# Patient Record
Sex: Female | Born: 1940
Health system: Southern US, Community
[De-identification: ages and names within clinical notes are randomized; demographics above are authoritative.]

## PROBLEM LIST (undated history)

## (undated) DIAGNOSIS — E785 Hyperlipidemia, unspecified: Secondary | ICD-10-CM

## (undated) DIAGNOSIS — G629 Polyneuropathy, unspecified: Secondary | ICD-10-CM

## (undated) DIAGNOSIS — M5430 Sciatica, unspecified side: Secondary | ICD-10-CM

## (undated) DIAGNOSIS — R1310 Dysphagia, unspecified: Secondary | ICD-10-CM

## (undated) DIAGNOSIS — F419 Anxiety disorder, unspecified: Secondary | ICD-10-CM

## (undated) DIAGNOSIS — I10 Essential (primary) hypertension: Secondary | ICD-10-CM

## (undated) DIAGNOSIS — K589 Irritable bowel syndrome without diarrhea: Secondary | ICD-10-CM

## (undated) DIAGNOSIS — T7840XA Allergy, unspecified, initial encounter: Secondary | ICD-10-CM

## (undated) DIAGNOSIS — M8580 Other specified disorders of bone density and structure, unspecified site: Secondary | ICD-10-CM

## (undated) DIAGNOSIS — M545 Low back pain, unspecified: Secondary | ICD-10-CM

## (undated) DIAGNOSIS — M5416 Radiculopathy, lumbar region: Secondary | ICD-10-CM

## (undated) DIAGNOSIS — K219 Gastro-esophageal reflux disease without esophagitis: Secondary | ICD-10-CM

## (undated) DIAGNOSIS — M791 Myalgia, unspecified site: Secondary | ICD-10-CM

## (undated) DIAGNOSIS — I6523 Occlusion and stenosis of bilateral carotid arteries: Secondary | ICD-10-CM

## (undated) DIAGNOSIS — G479 Sleep disorder, unspecified: Secondary | ICD-10-CM

## (undated) DIAGNOSIS — G8929 Other chronic pain: Secondary | ICD-10-CM

## (undated) DIAGNOSIS — N200 Calculus of kidney: Secondary | ICD-10-CM

## (undated) DIAGNOSIS — J40 Bronchitis, not specified as acute or chronic: Secondary | ICD-10-CM

## (undated) DIAGNOSIS — R2689 Other abnormalities of gait and mobility: Secondary | ICD-10-CM

## (undated) DIAGNOSIS — Z923 Personal history of irradiation: Secondary | ICD-10-CM

## (undated) DIAGNOSIS — K5792 Diverticulitis of intestine, part unspecified, without perforation or abscess without bleeding: Secondary | ICD-10-CM

## (undated) DIAGNOSIS — C801 Malignant (primary) neoplasm, unspecified: Secondary | ICD-10-CM

## (undated) DIAGNOSIS — C50919 Malignant neoplasm of unspecified site of unspecified female breast: Secondary | ICD-10-CM

## (undated) DIAGNOSIS — G4700 Insomnia, unspecified: Secondary | ICD-10-CM

## (undated) HISTORY — DX: Polyneuropathy, unspecified: G62.9

## (undated) HISTORY — PX: ABDOMINAL HYSTERECTOMY: SHX81

## (undated) HISTORY — DX: Low back pain, unspecified: M54.50

## (undated) HISTORY — DX: Other abnormalities of gait and mobility: R26.89

## (undated) HISTORY — DX: Malignant (primary) neoplasm, unspecified: C80.1

## (undated) HISTORY — DX: Irritable bowel syndrome, unspecified: K58.9

## (undated) HISTORY — DX: Diverticulitis of intestine, part unspecified, without perforation or abscess without bleeding: K57.92

## (undated) HISTORY — DX: Hyperlipidemia, unspecified: E78.5

## (undated) HISTORY — DX: Other specified disorders of bone density and structure, unspecified site: M85.80

## (undated) HISTORY — DX: Occlusion and stenosis of bilateral carotid arteries: I65.23

## (undated) HISTORY — DX: Bronchitis, not specified as acute or chronic: J40

## (undated) HISTORY — PX: CATARACT EXTRACTION, BILATERAL: SHX1313

## (undated) HISTORY — DX: Myalgia, unspecified site: M79.10

## (undated) HISTORY — DX: Calculus of kidney: N20.0

## (undated) HISTORY — DX: Sleep disorder, unspecified: G47.9

## (undated) HISTORY — PX: MOUTH SURGERY: SHX715

## (undated) HISTORY — PX: EXPLORATORY LAPAROTOMY: SUR591

## (undated) HISTORY — DX: Other chronic pain: G89.29

## (undated) HISTORY — DX: Radiculopathy, lumbar region: M54.16

## (undated) HISTORY — DX: Essential (primary) hypertension: I10

## (undated) HISTORY — DX: Insomnia, unspecified: G47.00

## (undated) HISTORY — DX: Sciatica, unspecified side: M54.30

## (undated) HISTORY — DX: Allergy, unspecified, initial encounter: T78.40XA

## (undated) HISTORY — DX: Dysphagia, unspecified: R13.10

## (undated) HISTORY — PX: BREAST LUMPECTOMY: SHX2

## (undated) HISTORY — PX: BREAST EXCISIONAL BIOPSY: SUR124

## (undated) HISTORY — PX: BREAST SURGERY: SHX581

## (undated) HISTORY — PX: EYE SURGERY: SHX253

## (undated) HISTORY — DX: Anxiety disorder, unspecified: F41.9

## (undated) SURGERY — Surgical Case
Anesthesia: *Unknown

---
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
## 1998-02-23 ENCOUNTER — Encounter: Payer: Self-pay | Admitting: Internal Medicine

## 1998-02-23 ENCOUNTER — Ambulatory Visit (HOSPITAL_COMMUNITY): Admission: RE | Admit: 1998-02-23 | Discharge: 1998-02-23 | Payer: Self-pay | Admitting: Internal Medicine

## 1998-03-12 ENCOUNTER — Ambulatory Visit (HOSPITAL_COMMUNITY): Admission: RE | Admit: 1998-03-12 | Discharge: 1998-03-12 | Payer: Self-pay | Admitting: *Deleted

## 1998-04-23 ENCOUNTER — Ambulatory Visit (HOSPITAL_COMMUNITY): Admission: RE | Admit: 1998-04-23 | Discharge: 1998-04-23 | Payer: Self-pay | Admitting: *Deleted

## 1998-05-05 ENCOUNTER — Encounter: Admission: RE | Admit: 1998-05-05 | Discharge: 1998-08-03 | Payer: Self-pay | Admitting: Radiation Oncology

## 1998-10-14 ENCOUNTER — Encounter: Payer: Self-pay | Admitting: Internal Medicine

## 1998-10-14 ENCOUNTER — Ambulatory Visit (HOSPITAL_COMMUNITY): Admission: RE | Admit: 1998-10-14 | Discharge: 1998-10-14 | Payer: Self-pay | Admitting: Internal Medicine

## 1998-10-15 ENCOUNTER — Ambulatory Visit (HOSPITAL_COMMUNITY): Admission: RE | Admit: 1998-10-15 | Discharge: 1998-10-15 | Payer: Self-pay | Admitting: Gastroenterology

## 1999-04-20 ENCOUNTER — Encounter: Admission: RE | Admit: 1999-04-20 | Discharge: 1999-04-20 | Payer: Self-pay | Admitting: Internal Medicine

## 1999-04-20 ENCOUNTER — Encounter: Payer: Self-pay | Admitting: Internal Medicine

## 1999-06-23 ENCOUNTER — Ambulatory Visit (HOSPITAL_COMMUNITY): Admission: RE | Admit: 1999-06-23 | Discharge: 1999-06-23 | Payer: Self-pay | Admitting: Internal Medicine

## 1999-06-23 ENCOUNTER — Encounter: Payer: Self-pay | Admitting: Internal Medicine

## 2000-04-20 ENCOUNTER — Encounter: Payer: Self-pay | Admitting: Internal Medicine

## 2000-04-20 ENCOUNTER — Encounter: Admission: RE | Admit: 2000-04-20 | Discharge: 2000-04-20 | Payer: Self-pay | Admitting: Internal Medicine

## 2000-05-05 ENCOUNTER — Emergency Department (HOSPITAL_COMMUNITY): Admission: EM | Admit: 2000-05-05 | Discharge: 2000-05-05 | Payer: Self-pay | Admitting: Internal Medicine

## 2000-05-05 ENCOUNTER — Encounter: Payer: Self-pay | Admitting: Emergency Medicine

## 2000-05-29 ENCOUNTER — Ambulatory Visit (HOSPITAL_COMMUNITY): Admission: RE | Admit: 2000-05-29 | Discharge: 2000-05-29 | Payer: Self-pay | Admitting: Internal Medicine

## 2000-05-29 ENCOUNTER — Encounter: Payer: Self-pay | Admitting: Internal Medicine

## 2000-11-22 ENCOUNTER — Emergency Department (HOSPITAL_COMMUNITY): Admission: EM | Admit: 2000-11-22 | Discharge: 2000-11-22 | Payer: Self-pay | Admitting: Emergency Medicine

## 2000-12-13 ENCOUNTER — Encounter: Admission: RE | Admit: 2000-12-13 | Discharge: 2000-12-13 | Payer: Self-pay | Admitting: Gastroenterology

## 2000-12-13 ENCOUNTER — Encounter: Payer: Self-pay | Admitting: Gastroenterology

## 2001-08-28 ENCOUNTER — Encounter: Admission: RE | Admit: 2001-08-28 | Discharge: 2001-08-28 | Payer: Self-pay | Admitting: Internal Medicine

## 2001-08-28 ENCOUNTER — Encounter: Payer: Self-pay | Admitting: Internal Medicine

## 2001-09-04 ENCOUNTER — Other Ambulatory Visit: Admission: RE | Admit: 2001-09-04 | Discharge: 2001-09-04 | Payer: Self-pay | Admitting: Gynecology

## 2002-08-09 ENCOUNTER — Encounter: Payer: Self-pay | Admitting: Gastroenterology

## 2002-08-09 ENCOUNTER — Encounter: Admission: RE | Admit: 2002-08-09 | Discharge: 2002-08-09 | Payer: Self-pay | Admitting: Gastroenterology

## 2002-09-24 ENCOUNTER — Ambulatory Visit (HOSPITAL_COMMUNITY): Admission: RE | Admit: 2002-09-24 | Discharge: 2002-09-24 | Payer: Self-pay | Admitting: Internal Medicine

## 2002-09-24 ENCOUNTER — Encounter: Payer: Self-pay | Admitting: Internal Medicine

## 2002-09-27 ENCOUNTER — Ambulatory Visit (HOSPITAL_COMMUNITY): Admission: RE | Admit: 2002-09-27 | Discharge: 2002-09-27 | Payer: Self-pay | Admitting: Gastroenterology

## 2002-10-01 ENCOUNTER — Ambulatory Visit (HOSPITAL_COMMUNITY): Admission: RE | Admit: 2002-10-01 | Discharge: 2002-10-01 | Payer: Self-pay | Admitting: Internal Medicine

## 2002-10-01 ENCOUNTER — Encounter: Payer: Self-pay | Admitting: Internal Medicine

## 2002-10-01 ENCOUNTER — Encounter: Admission: RE | Admit: 2002-10-01 | Discharge: 2002-10-01 | Payer: Self-pay | Admitting: Internal Medicine

## 2002-10-23 ENCOUNTER — Other Ambulatory Visit: Admission: RE | Admit: 2002-10-23 | Discharge: 2002-10-23 | Payer: Self-pay | Admitting: Gynecology

## 2003-10-02 ENCOUNTER — Encounter: Admission: RE | Admit: 2003-10-02 | Discharge: 2003-10-02 | Payer: Self-pay | Admitting: Oncology

## 2003-11-22 ENCOUNTER — Emergency Department (HOSPITAL_COMMUNITY): Admission: EM | Admit: 2003-11-22 | Discharge: 2003-11-22 | Payer: Self-pay

## 2005-05-19 ENCOUNTER — Encounter: Admission: RE | Admit: 2005-05-19 | Discharge: 2005-05-19 | Payer: Self-pay | Admitting: Internal Medicine

## 2005-10-28 ENCOUNTER — Encounter: Admission: RE | Admit: 2005-10-28 | Discharge: 2005-10-28 | Payer: Self-pay | Admitting: Internal Medicine

## 2006-05-24 ENCOUNTER — Encounter: Admission: RE | Admit: 2006-05-24 | Discharge: 2006-05-24 | Payer: Self-pay | Admitting: Internal Medicine

## 2006-05-25 ENCOUNTER — Ambulatory Visit: Payer: Self-pay | Admitting: Vascular Surgery

## 2006-05-25 ENCOUNTER — Ambulatory Visit (HOSPITAL_COMMUNITY): Admission: RE | Admit: 2006-05-25 | Discharge: 2006-05-25 | Payer: Self-pay | Admitting: Internal Medicine

## 2006-05-25 ENCOUNTER — Encounter: Payer: Self-pay | Admitting: Vascular Surgery

## 2006-11-09 ENCOUNTER — Ambulatory Visit (HOSPITAL_COMMUNITY): Admission: RE | Admit: 2006-11-09 | Discharge: 2006-11-09 | Payer: Self-pay | Admitting: Internal Medicine

## 2006-11-20 ENCOUNTER — Ambulatory Visit (HOSPITAL_COMMUNITY): Admission: RE | Admit: 2006-11-20 | Discharge: 2006-11-20 | Payer: Self-pay | Admitting: Internal Medicine

## 2007-05-15 ENCOUNTER — Other Ambulatory Visit: Admission: RE | Admit: 2007-05-15 | Discharge: 2007-05-15 | Payer: Self-pay | Admitting: Family Medicine

## 2007-06-06 ENCOUNTER — Encounter: Admission: RE | Admit: 2007-06-06 | Discharge: 2007-06-06 | Payer: Self-pay | Admitting: Family Medicine

## 2008-03-18 ENCOUNTER — Encounter: Admission: RE | Admit: 2008-03-18 | Discharge: 2008-03-18 | Payer: Self-pay | Admitting: *Deleted

## 2008-03-31 ENCOUNTER — Encounter: Admission: RE | Admit: 2008-03-31 | Discharge: 2008-03-31 | Payer: Self-pay | Admitting: *Deleted

## 2008-09-16 ENCOUNTER — Ambulatory Visit (HOSPITAL_COMMUNITY): Admission: RE | Admit: 2008-09-16 | Discharge: 2008-09-16 | Payer: Self-pay | Admitting: Internal Medicine

## 2008-10-30 ENCOUNTER — Encounter: Admission: RE | Admit: 2008-10-30 | Discharge: 2008-10-30 | Payer: Self-pay | Admitting: Family Medicine

## 2009-11-03 ENCOUNTER — Encounter: Admission: RE | Admit: 2009-11-03 | Discharge: 2009-11-03 | Payer: Self-pay | Admitting: Family Medicine

## 2010-03-16 ENCOUNTER — Other Ambulatory Visit: Payer: Self-pay | Admitting: Family Medicine

## 2010-03-22 ENCOUNTER — Ambulatory Visit
Admission: RE | Admit: 2010-03-22 | Discharge: 2010-03-22 | Disposition: A | Discharge status: Home / Self Care | Payer: Medicare Other | Source: Ambulatory Visit | Attending: Family Medicine | Admitting: Family Medicine

## 2010-05-28 NOTE — Op Note (Signed)
   NAME:  Alexandra Gonzales, Alexandra Gonzales                            ACCOUNT NO.:  [id number]   MEDICAL RECORD NO.:  [id number]                   PATIENT TYPE:  AMB   LOCATION:  ENDO                                 FACILITY:  [facility]   PHYSICIAN:  Anselmo Rod, M.D.               DATE OF BIRTH:  10/21/1940   DATE OF PROCEDURE:  09/27/2002  DATE OF DISCHARGE:  09/27/2002                                 OPERATIVE REPORT   PROCEDURE:  Screening colonoscopy.   ENDOSCOPIST:  Anselmo Rod, M.D.   INSTRUMENT USED:  Olympus video colonoscope.   INDICATIONS FOR PROCEDURE:  This 70 year old female with a personal history  of breast cancer, undergoing a screening colonoscopy to rule out colonic  polyps, masses, etc.   PRE-PROCEDURE PREPARATION:  An informed consent was procured from the  patient.  The patient was fasted for eight hours prior to the procedure, and  then prepped with a bottle of magnesium citrate and a gallon of GoLYTELY on  the night prior to the procedure.   PRE-PROCEDURE PHYSICAL EXAM:  VITAL SIGNS:  Stable.  NECK:  Supple.  CHEST:  Clear to auscultation.  HEART:  S1, S2 regular.  ABDOMEN:  Soft, with normal bowel sounds.   DESCRIPTION OF PROCEDURE:  The patient was placed in the left lateral  decubitus position and sedated with an additional 20 mg of Demerol and 2 mg  of Versed intravenously.  Once the patient was adequately sedated and  maintained on low-flow oxygen and continuous cardiac monitoring, the Olympus  video colonoscope was advanced from the rectum to the cecum without  difficulty.  Small external and internal hemorrhoids were seen.  No masses,  polyps, erosions, ulcerations, or diverticula were present.  The patient tolerated the procedure well, without complications.   IMPRESSION:  Small internal and external hemorrhoids, otherwise normal  colonoscopy to the cecum.   RECOMMENDATIONS:  1. Repeat colonic screening is recommended in the next five years,  considering the patient's personal history of     breast cancer.  2. Outpatient followup in the next two weeks.  3. Continue a high fiber diet with liberal fluid intake.                                               Anselmo Rod, M.D.    JNM/MEDQ  D:  09/30/2002  T:  09/30/2002  Job:  [id number]   cc:   Lucky Cowboy, M.D.  [address]  [place], [place] [address]  Fax: [phone]

## 2010-05-28 NOTE — Op Note (Signed)
   NAME:  Alexandra Gonzales, Alexandra Gonzales                            ACCOUNT NO.:  [id number]   MEDICAL RECORD NO.:  [id number]                   PATIENT TYPE:  AMB   LOCATION:  ENDO                                 FACILITY:  [facility]   PHYSICIAN:  Anselmo Rod, M.D.               DATE OF BIRTH:  27-Sep-1940   DATE OF PROCEDURE:  09/30/2002  DATE OF DISCHARGE:  09/27/2002                                 OPERATIVE REPORT   PROCEDURE PERFORMED:  Esophagogastroduodenoscopy.   ENDOSCOPIST:  Charna Elizabeth, M.D.   INSTRUMENT USED:  Olympus video panendoscope.   INDICATIONS FOR PROCEDURE:  Epigastric pain in a 70 year old female with a  previous history of breast cancer.  Rule out peptic ulcer disease,  esophagitis, gastritis, etc.   PREPROCEDURE PREPARATION:  Informed consent was procured from the patient.  The patient was fasted for eight hours prior to the procedure.   PREPROCEDURE PHYSICAL:  The patient had stable vital signs.  Neck supple,  chest clear to auscultation.  S1, S2 regular.  Abdomen soft with normal  bowel sounds.   DESCRIPTION OF PROCEDURE:  The patient was placed in the left lateral  decubitus position and sedated with 50 mg of Demerol and 5 mg of Versed  intravenously.  Once the patient was adequately sedated and maintained on  low-flow oxygen and continuous cardiac monitoring, the Olympus video  panendoscope was advanced through the mouth piece over the tongue into the  esophagus under direct vision.  The entire esophagus appeared normal with no  evidence of ring, stricture, masses, esophagitis or Barrett's mucosa.  The  scope was then advanced to the stomach.  The entire gastric mucosa of the  proximal small bowel appeared normal.   IMPRESSION:  Normal esophagogastroduodenoscopy.   RECOMMENDATIONS:  Proceed with colonoscopy at this time.  Further  recommendations made thereafter.                                                Anselmo Rod, M.D.    JNM/MEDQ  D:  09/30/2002   T:  09/30/2002  Job:  [id number]   cc:   Lucky Cowboy, M.D.  [address]  [place], [place] [address]  Fax: [phone]

## 2010-08-04 ENCOUNTER — Other Ambulatory Visit: Payer: Self-pay | Admitting: Neurology

## 2010-08-04 DIAGNOSIS — A881 Epidemic vertigo: Secondary | ICD-10-CM

## 2010-08-04 DIAGNOSIS — R413 Other amnesia: Secondary | ICD-10-CM

## 2010-08-12 ENCOUNTER — Ambulatory Visit
Admission: RE | Admit: 2010-08-12 | Discharge: 2010-08-12 | Disposition: A | Discharge status: Home / Self Care | Payer: Medicare Other | Source: Ambulatory Visit | Attending: Neurology | Admitting: Neurology

## 2010-08-12 DIAGNOSIS — R413 Other amnesia: Secondary | ICD-10-CM

## 2010-08-12 DIAGNOSIS — A881 Epidemic vertigo: Secondary | ICD-10-CM

## 2010-09-24 ENCOUNTER — Other Ambulatory Visit: Payer: Self-pay | Admitting: Family Medicine

## 2010-09-24 ENCOUNTER — Ambulatory Visit
Admission: RE | Admit: 2010-09-24 | Discharge: 2010-09-24 | Disposition: A | Discharge status: Home / Self Care | Payer: Medicare Other | Source: Ambulatory Visit | Attending: Family Medicine | Admitting: Family Medicine

## 2010-09-24 DIAGNOSIS — M5412 Radiculopathy, cervical region: Secondary | ICD-10-CM

## 2010-10-12 ENCOUNTER — Ambulatory Visit
Admission: RE | Admit: 2010-10-12 | Discharge: 2010-10-12 | Disposition: A | Discharge status: Home / Self Care | Payer: Medicare Other | Source: Ambulatory Visit | Attending: Family Medicine | Admitting: Family Medicine

## 2010-10-12 ENCOUNTER — Other Ambulatory Visit: Payer: Self-pay | Admitting: Family Medicine

## 2010-10-12 DIAGNOSIS — M545 Low back pain: Secondary | ICD-10-CM

## 2010-10-18 ENCOUNTER — Ambulatory Visit (HOSPITAL_COMMUNITY)
Admission: RE | Admit: 2010-10-18 | Discharge: 2010-10-18 | Disposition: A | Discharge status: Home / Self Care | Payer: Medicare Other | Source: Ambulatory Visit | Attending: Urology | Admitting: Urology

## 2010-10-18 DIAGNOSIS — R112 Nausea with vomiting, unspecified: Secondary | ICD-10-CM | POA: Insufficient documentation

## 2010-10-18 DIAGNOSIS — M51379 Other intervertebral disc degeneration, lumbosacral region without mention of lumbar back pain or lower extremity pain: Secondary | ICD-10-CM | POA: Insufficient documentation

## 2010-10-18 DIAGNOSIS — M545 Low back pain, unspecified: Secondary | ICD-10-CM | POA: Insufficient documentation

## 2010-10-18 DIAGNOSIS — N1330 Unspecified hydronephrosis: Secondary | ICD-10-CM | POA: Insufficient documentation

## 2010-10-18 DIAGNOSIS — R109 Unspecified abdominal pain: Secondary | ICD-10-CM | POA: Insufficient documentation

## 2010-10-18 DIAGNOSIS — N201 Calculus of ureter: Secondary | ICD-10-CM | POA: Insufficient documentation

## 2010-10-18 DIAGNOSIS — Z853 Personal history of malignant neoplasm of breast: Secondary | ICD-10-CM | POA: Insufficient documentation

## 2010-10-18 DIAGNOSIS — R3129 Other microscopic hematuria: Secondary | ICD-10-CM | POA: Insufficient documentation

## 2010-10-18 DIAGNOSIS — M5137 Other intervertebral disc degeneration, lumbosacral region: Secondary | ICD-10-CM | POA: Insufficient documentation

## 2010-10-18 DIAGNOSIS — I10 Essential (primary) hypertension: Secondary | ICD-10-CM | POA: Insufficient documentation

## 2010-11-12 ENCOUNTER — Other Ambulatory Visit: Payer: Self-pay | Admitting: Family Medicine

## 2010-11-12 DIAGNOSIS — Z1231 Encounter for screening mammogram for malignant neoplasm of breast: Secondary | ICD-10-CM

## 2010-11-23 ENCOUNTER — Other Ambulatory Visit: Payer: Self-pay | Admitting: Neurological Surgery

## 2010-11-23 ENCOUNTER — Ambulatory Visit
Admission: RE | Admit: 2010-11-23 | Discharge: 2010-11-23 | Disposition: A | Discharge status: Home / Self Care | Payer: Medicare Other | Source: Ambulatory Visit | Attending: Neurological Surgery | Admitting: Neurological Surgery

## 2010-11-23 DIAGNOSIS — M47812 Spondylosis without myelopathy or radiculopathy, cervical region: Secondary | ICD-10-CM

## 2010-11-23 DIAGNOSIS — M546 Pain in thoracic spine: Secondary | ICD-10-CM

## 2010-12-09 ENCOUNTER — Ambulatory Visit
Admission: RE | Admit: 2010-12-09 | Discharge: 2010-12-09 | Disposition: A | Discharge status: Home / Self Care | Payer: Medicare Other | Source: Ambulatory Visit | Attending: Family Medicine | Admitting: Family Medicine

## 2010-12-09 DIAGNOSIS — Z1231 Encounter for screening mammogram for malignant neoplasm of breast: Secondary | ICD-10-CM

## 2011-12-21 ENCOUNTER — Other Ambulatory Visit: Payer: Self-pay | Admitting: Family Medicine

## 2012-01-02 ENCOUNTER — Other Ambulatory Visit: Payer: Self-pay | Admitting: Family Medicine

## 2012-01-02 DIAGNOSIS — Z853 Personal history of malignant neoplasm of breast: Secondary | ICD-10-CM

## 2012-01-12 ENCOUNTER — Other Ambulatory Visit: Payer: Self-pay | Admitting: Family Medicine

## 2012-01-12 ENCOUNTER — Ambulatory Visit
Admission: RE | Admit: 2012-01-12 | Discharge: 2012-01-12 | Disposition: A | Discharge status: Home / Self Care | Payer: Medicare Other | Source: Ambulatory Visit | Attending: Family Medicine | Admitting: Family Medicine

## 2012-01-12 DIAGNOSIS — Z853 Personal history of malignant neoplasm of breast: Secondary | ICD-10-CM

## 2012-05-23 ENCOUNTER — Other Ambulatory Visit: Payer: Self-pay | Admitting: Family Medicine

## 2012-05-23 DIAGNOSIS — R109 Unspecified abdominal pain: Secondary | ICD-10-CM

## 2012-05-28 ENCOUNTER — Other Ambulatory Visit: Payer: Self-pay | Admitting: Family Medicine

## 2012-05-28 ENCOUNTER — Ambulatory Visit
Admission: RE | Admit: 2012-05-28 | Discharge: 2012-05-28 | Disposition: A | Discharge status: Home / Self Care | Payer: Medicare Other | Source: Ambulatory Visit | Attending: Family Medicine | Admitting: Family Medicine

## 2012-05-28 DIAGNOSIS — R109 Unspecified abdominal pain: Secondary | ICD-10-CM

## 2012-11-20 ENCOUNTER — Other Ambulatory Visit: Payer: Self-pay | Admitting: Gastroenterology

## 2012-11-20 DIAGNOSIS — R1033 Periumbilical pain: Secondary | ICD-10-CM

## 2012-12-18 ENCOUNTER — Encounter (HOSPITAL_COMMUNITY)
Admission: RE | Admit: 2012-12-18 | Discharge: 2012-12-18 | Disposition: A | Discharge status: Home / Self Care | Payer: Medicare Other | Source: Ambulatory Visit | Attending: Gastroenterology | Admitting: Gastroenterology

## 2012-12-18 ENCOUNTER — Ambulatory Visit (HOSPITAL_COMMUNITY)
Admission: RE | Admit: 2012-12-18 | Discharge: 2012-12-18 | Disposition: A | Discharge status: Home / Self Care | Payer: Medicare Other | Source: Ambulatory Visit | Attending: Gastroenterology | Admitting: Gastroenterology

## 2012-12-18 DIAGNOSIS — R1033 Periumbilical pain: Secondary | ICD-10-CM | POA: Insufficient documentation

## 2012-12-18 DIAGNOSIS — N281 Cyst of kidney, acquired: Secondary | ICD-10-CM | POA: Insufficient documentation

## 2012-12-18 DIAGNOSIS — R109 Unspecified abdominal pain: Secondary | ICD-10-CM | POA: Insufficient documentation

## 2012-12-18 MED ORDER — TECHNETIUM TC 99M MEBROFENIN IV KIT
5.0000 | PACK | Freq: Once | INTRAVENOUS | Status: AC | PRN
  Administered 2012-12-18: 5 via INTRAVENOUS

## 2013-03-20 ENCOUNTER — Other Ambulatory Visit: Payer: Self-pay

## 2013-03-20 DIAGNOSIS — Z1231 Encounter for screening mammogram for malignant neoplasm of breast: Secondary | ICD-10-CM

## 2013-03-20 DIAGNOSIS — Z9889 Other specified postprocedural states: Secondary | ICD-10-CM

## 2013-04-09 ENCOUNTER — Ambulatory Visit: Payer: Medicare Other

## 2013-04-16 ENCOUNTER — Ambulatory Visit
Admission: RE | Admit: 2013-04-16 | Discharge: 2013-04-16 | Disposition: A | Discharge status: Home / Self Care | Payer: Commercial Managed Care - HMO | Source: Ambulatory Visit

## 2013-04-16 DIAGNOSIS — Z9889 Other specified postprocedural states: Secondary | ICD-10-CM

## 2013-04-16 DIAGNOSIS — Z1231 Encounter for screening mammogram for malignant neoplasm of breast: Secondary | ICD-10-CM

## 2013-04-25 ENCOUNTER — Ambulatory Visit
Admission: RE | Admit: 2013-04-25 | Discharge: 2013-04-25 | Disposition: A | Discharge status: Home / Self Care | Payer: Commercial Managed Care - HMO | Source: Ambulatory Visit | Attending: Family Medicine | Admitting: Family Medicine

## 2013-04-25 ENCOUNTER — Other Ambulatory Visit: Payer: Self-pay | Admitting: Family Medicine

## 2013-04-25 DIAGNOSIS — J329 Chronic sinusitis, unspecified: Secondary | ICD-10-CM

## 2013-10-22 ENCOUNTER — Encounter (INDEPENDENT_AMBULATORY_CARE_PROVIDER_SITE_OTHER): Payer: Self-pay

## 2013-10-22 ENCOUNTER — Other Ambulatory Visit: Payer: Self-pay | Admitting: Family Medicine

## 2013-10-22 ENCOUNTER — Ambulatory Visit
Admission: RE | Admit: 2013-10-22 | Discharge: 2013-10-22 | Disposition: A | Discharge status: Home / Self Care | Payer: Commercial Managed Care - HMO | Source: Ambulatory Visit | Attending: Family Medicine | Admitting: Family Medicine

## 2013-10-22 DIAGNOSIS — R05 Cough: Secondary | ICD-10-CM

## 2013-10-22 DIAGNOSIS — R059 Cough, unspecified: Secondary | ICD-10-CM

## 2014-05-02 ENCOUNTER — Other Ambulatory Visit: Payer: Self-pay

## 2014-05-02 DIAGNOSIS — Z1231 Encounter for screening mammogram for malignant neoplasm of breast: Secondary | ICD-10-CM

## 2014-05-20 ENCOUNTER — Other Ambulatory Visit: Payer: Self-pay | Admitting: Family Medicine

## 2014-05-20 ENCOUNTER — Ambulatory Visit
Admission: RE | Admit: 2014-05-20 | Discharge: 2014-05-20 | Disposition: A | Discharge status: Home / Self Care | Payer: PPO | Source: Ambulatory Visit | Attending: Family Medicine | Admitting: Family Medicine

## 2014-05-20 DIAGNOSIS — M79671 Pain in right foot: Secondary | ICD-10-CM

## 2014-05-21 ENCOUNTER — Other Ambulatory Visit: Payer: Self-pay | Admitting: Family Medicine

## 2014-05-21 DIAGNOSIS — R1013 Epigastric pain: Secondary | ICD-10-CM

## 2014-05-22 ENCOUNTER — Ambulatory Visit: Payer: Commercial Managed Care - HMO

## 2014-05-27 ENCOUNTER — Ambulatory Visit
Admission: RE | Admit: 2014-05-27 | Discharge: 2014-05-27 | Disposition: A | Discharge status: Home / Self Care | Payer: PPO | Source: Ambulatory Visit | Attending: Family Medicine | Admitting: Family Medicine

## 2014-05-27 ENCOUNTER — Ambulatory Visit: Payer: PPO

## 2014-05-27 ENCOUNTER — Other Ambulatory Visit: Payer: PPO

## 2014-05-27 DIAGNOSIS — R1013 Epigastric pain: Secondary | ICD-10-CM

## 2014-05-27 MED ORDER — IOHEXOL 300 MG/ML  SOLN
100.0000 mL | Freq: Once | INTRAMUSCULAR | Status: AC | PRN
  Administered 2014-05-27: 100 mL via INTRAVENOUS

## 2014-05-29 ENCOUNTER — Other Ambulatory Visit: Payer: PPO

## 2014-06-19 ENCOUNTER — Ambulatory Visit
Admission: RE | Admit: 2014-06-19 | Discharge: 2014-06-19 | Disposition: A | Discharge status: Home / Self Care | Payer: PPO | Source: Ambulatory Visit

## 2014-06-19 DIAGNOSIS — Z1231 Encounter for screening mammogram for malignant neoplasm of breast: Secondary | ICD-10-CM

## 2014-09-11 ENCOUNTER — Ambulatory Visit
Admission: RE | Admit: 2014-09-11 | Discharge: 2014-09-11 | Disposition: A | Discharge status: Home / Self Care | Payer: PPO | Source: Ambulatory Visit | Attending: Cardiology | Admitting: Cardiology

## 2014-09-11 ENCOUNTER — Other Ambulatory Visit: Payer: Self-pay | Admitting: Cardiology

## 2014-09-11 DIAGNOSIS — R0602 Shortness of breath: Secondary | ICD-10-CM

## 2015-01-15 DIAGNOSIS — Z9011 Acquired absence of right breast and nipple: Secondary | ICD-10-CM | POA: Diagnosis not present

## 2015-02-02 DIAGNOSIS — M79644 Pain in right finger(s): Secondary | ICD-10-CM | POA: Diagnosis not present

## 2015-02-02 DIAGNOSIS — M1990 Unspecified osteoarthritis, unspecified site: Secondary | ICD-10-CM | POA: Diagnosis not present

## 2015-02-09 DIAGNOSIS — D1030 Benign neoplasm of unspecified part of mouth: Secondary | ICD-10-CM | POA: Diagnosis not present

## 2015-03-02 DIAGNOSIS — H00022 Hordeolum internum right lower eyelid: Secondary | ICD-10-CM | POA: Diagnosis not present

## 2015-03-02 DIAGNOSIS — Z961 Presence of intraocular lens: Secondary | ICD-10-CM | POA: Diagnosis not present

## 2015-03-09 DIAGNOSIS — H00022 Hordeolum internum right lower eyelid: Secondary | ICD-10-CM | POA: Diagnosis not present

## 2015-04-10 DIAGNOSIS — M542 Cervicalgia: Secondary | ICD-10-CM | POA: Diagnosis not present

## 2015-05-22 ENCOUNTER — Other Ambulatory Visit: Payer: Self-pay

## 2015-05-22 DIAGNOSIS — Z1231 Encounter for screening mammogram for malignant neoplasm of breast: Secondary | ICD-10-CM

## 2015-06-24 ENCOUNTER — Ambulatory Visit
Admission: RE | Admit: 2015-06-24 | Discharge: 2015-06-24 | Disposition: A | Discharge status: Home / Self Care | Payer: PPO | Source: Ambulatory Visit

## 2015-06-24 DIAGNOSIS — Z1231 Encounter for screening mammogram for malignant neoplasm of breast: Secondary | ICD-10-CM | POA: Diagnosis not present

## 2015-06-25 ENCOUNTER — Ambulatory Visit: Payer: PPO

## 2015-07-16 DIAGNOSIS — R350 Frequency of micturition: Secondary | ICD-10-CM | POA: Diagnosis not present

## 2015-07-16 DIAGNOSIS — G479 Sleep disorder, unspecified: Secondary | ICD-10-CM | POA: Diagnosis not present

## 2015-07-16 DIAGNOSIS — E7800 Pure hypercholesterolemia, unspecified: Secondary | ICD-10-CM | POA: Diagnosis not present

## 2015-07-16 DIAGNOSIS — M791 Myalgia: Secondary | ICD-10-CM | POA: Diagnosis not present

## 2015-07-16 DIAGNOSIS — K219 Gastro-esophageal reflux disease without esophagitis: Secondary | ICD-10-CM | POA: Diagnosis not present

## 2015-07-16 DIAGNOSIS — T148 Other injury of unspecified body region: Secondary | ICD-10-CM | POA: Diagnosis not present

## 2015-07-16 DIAGNOSIS — I10 Essential (primary) hypertension: Secondary | ICD-10-CM | POA: Diagnosis not present

## 2015-07-16 DIAGNOSIS — Z23 Encounter for immunization: Secondary | ICD-10-CM | POA: Diagnosis not present

## 2015-07-30 DIAGNOSIS — R1310 Dysphagia, unspecified: Secondary | ICD-10-CM | POA: Diagnosis not present

## 2015-07-30 DIAGNOSIS — M542 Cervicalgia: Secondary | ICD-10-CM | POA: Diagnosis not present

## 2015-07-30 DIAGNOSIS — R0689 Other abnormalities of breathing: Secondary | ICD-10-CM | POA: Diagnosis not present

## 2015-09-01 ENCOUNTER — Other Ambulatory Visit: Payer: Self-pay | Admitting: Gastroenterology

## 2015-09-01 DIAGNOSIS — K580 Irritable bowel syndrome with diarrhea: Secondary | ICD-10-CM | POA: Diagnosis not present

## 2015-09-01 DIAGNOSIS — R1310 Dysphagia, unspecified: Secondary | ICD-10-CM

## 2015-09-01 DIAGNOSIS — K219 Gastro-esophageal reflux disease without esophagitis: Secondary | ICD-10-CM | POA: Diagnosis not present

## 2015-09-01 DIAGNOSIS — Z86010 Personal history of colonic polyps: Secondary | ICD-10-CM | POA: Diagnosis not present

## 2015-09-04 ENCOUNTER — Ambulatory Visit
Admission: RE | Admit: 2015-09-04 | Discharge: 2015-09-04 | Disposition: A | Discharge status: Home / Self Care | Payer: PPO | Source: Ambulatory Visit | Attending: Gastroenterology | Admitting: Gastroenterology

## 2015-09-04 DIAGNOSIS — R1310 Dysphagia, unspecified: Secondary | ICD-10-CM | POA: Diagnosis not present

## 2015-09-10 DIAGNOSIS — C50911 Malignant neoplasm of unspecified site of right female breast: Secondary | ICD-10-CM | POA: Diagnosis not present

## 2015-09-22 DIAGNOSIS — K644 Residual hemorrhoidal skin tags: Secondary | ICD-10-CM | POA: Diagnosis not present

## 2015-09-30 DIAGNOSIS — M722 Plantar fascial fibromatosis: Secondary | ICD-10-CM | POA: Diagnosis not present

## 2015-10-05 DIAGNOSIS — K6289 Other specified diseases of anus and rectum: Secondary | ICD-10-CM | POA: Diagnosis not present

## 2015-11-06 DIAGNOSIS — I10 Essential (primary) hypertension: Secondary | ICD-10-CM | POA: Diagnosis not present

## 2015-11-06 DIAGNOSIS — E7800 Pure hypercholesterolemia, unspecified: Secondary | ICD-10-CM | POA: Diagnosis not present

## 2015-11-06 DIAGNOSIS — M791 Myalgia: Secondary | ICD-10-CM | POA: Diagnosis not present

## 2015-12-31 DIAGNOSIS — Z01419 Encounter for gynecological examination (general) (routine) without abnormal findings: Secondary | ICD-10-CM | POA: Diagnosis not present

## 2015-12-31 DIAGNOSIS — Z6827 Body mass index (BMI) 27.0-27.9, adult: Secondary | ICD-10-CM | POA: Diagnosis not present

## 2016-01-19 DIAGNOSIS — I10 Essential (primary) hypertension: Secondary | ICD-10-CM | POA: Diagnosis not present

## 2016-01-19 DIAGNOSIS — K5792 Diverticulitis of intestine, part unspecified, without perforation or abscess without bleeding: Secondary | ICD-10-CM | POA: Diagnosis not present

## 2016-01-19 DIAGNOSIS — R1030 Lower abdominal pain, unspecified: Secondary | ICD-10-CM | POA: Diagnosis not present

## 2016-01-19 DIAGNOSIS — E7800 Pure hypercholesterolemia, unspecified: Secondary | ICD-10-CM | POA: Diagnosis not present

## 2016-01-19 DIAGNOSIS — G479 Sleep disorder, unspecified: Secondary | ICD-10-CM | POA: Diagnosis not present

## 2016-01-19 DIAGNOSIS — K219 Gastro-esophageal reflux disease without esophagitis: Secondary | ICD-10-CM | POA: Diagnosis not present

## 2016-02-01 DIAGNOSIS — M542 Cervicalgia: Secondary | ICD-10-CM | POA: Diagnosis not present

## 2016-02-01 DIAGNOSIS — M545 Low back pain: Secondary | ICD-10-CM | POA: Diagnosis not present

## 2016-02-09 DIAGNOSIS — G518 Other disorders of facial nerve: Secondary | ICD-10-CM | POA: Diagnosis not present

## 2016-02-09 DIAGNOSIS — D3132 Benign neoplasm of left choroid: Secondary | ICD-10-CM | POA: Diagnosis not present

## 2016-02-09 DIAGNOSIS — Z961 Presence of intraocular lens: Secondary | ICD-10-CM | POA: Diagnosis not present

## 2016-02-09 DIAGNOSIS — H40013 Open angle with borderline findings, low risk, bilateral: Secondary | ICD-10-CM | POA: Diagnosis not present

## 2016-02-16 ENCOUNTER — Ambulatory Visit: Payer: PPO | Admitting: Physical Therapy

## 2016-02-18 ENCOUNTER — Ambulatory Visit: Payer: PPO | Attending: Family Medicine | Admitting: Physical Therapy

## 2016-02-18 ENCOUNTER — Encounter: Payer: Self-pay | Admitting: Physical Therapy

## 2016-02-18 DIAGNOSIS — R262 Difficulty in walking, not elsewhere classified: Secondary | ICD-10-CM | POA: Insufficient documentation

## 2016-02-18 DIAGNOSIS — M6281 Muscle weakness (generalized): Secondary | ICD-10-CM | POA: Insufficient documentation

## 2016-02-18 DIAGNOSIS — M545 Low back pain: Secondary | ICD-10-CM | POA: Insufficient documentation

## 2016-02-18 DIAGNOSIS — G629 Polyneuropathy, unspecified: Secondary | ICD-10-CM | POA: Diagnosis not present

## 2016-02-18 DIAGNOSIS — G8929 Other chronic pain: Secondary | ICD-10-CM | POA: Insufficient documentation

## 2016-02-18 DIAGNOSIS — K219 Gastro-esophageal reflux disease without esophagitis: Secondary | ICD-10-CM | POA: Insufficient documentation

## 2016-02-18 DIAGNOSIS — M542 Cervicalgia: Secondary | ICD-10-CM | POA: Diagnosis not present

## 2016-02-18 NOTE — Therapy (Addendum)
[name], [place], [address] Phone: [phone]   Fax:  [phone]  Physical Therapy Evaluation  Patient Details  Name: Alexandra Gonzales MRN: [id number] Date of Birth: Aug 20, 1940 Referring Provider: Marilynne Drivers, PA  Encounter Date: 02/18/2016      PT End of Session - 02/18/16 1140    Visit Number 1   Number of Visits 13   Date for PT Re-Evaluation 04/01/16   Authorization Type MCR advan KX at visit 15   PT Start Time 1130   PT Stop Time 1219   PT Time Calculation (min) 49 min   Activity Tolerance Patient tolerated treatment well   Behavior During Therapy Anxious      Past Medical History:  Diagnosis Date  . Allergy   . Anxiety   . Cancer [facility])    breast cancer 2004  . Hyperlipidemia   . Hypertension   . IBS (irritable bowel syndrome)   . Insomnia   . Kidney stones   . Osteopenia   . Overactive bladder     Past Surgical History:  Procedure Laterality Date  . ABDOMINAL HYSTERECTOMY    . BREAST SURGERY     R lumpectomy 2000  . EXPLORATORY LAPAROTOMY    . EYE SURGERY     bilat cataract removal  . MOUTH SURGERY    . TONSILLECTOMY      There were no vitals filed for this visit.       Subjective Assessment - 02/18/16 1143    Subjective pt reports back pain chronic but about 4-5 weels ago began feeling LBP and travelled from low back to buttock and into hamstring but resolved. Pt reports feeling as though she does not have oxygen in her R hand, feels crampy; has been going on for about a year. Mildline neck pain that hurts from time to time. Reaching overhead huts neck. Pt reports being very active but recently has experienced a decrease in tolerance to activity level. Has had dizzy spells recently- since having teeth removed after falls 2 years ago, approx 3/week. Pt reports unrelenting rectal and vaginal pain that she has discussed with her MD and they have referred her to specialists but she  does not want to go at this time for fear of getting the flu. Will not go to the gym due to fear of flu as well.    How long can you sit comfortably? 1 hour   Patient Stated Goals go back to the gym, decrease pain, return to dancing, decreased stiffness to walk.    Currently in Pain? Yes   Pain Score 4    Pain Location Neck   Pain Orientation Mid   Pain Descriptors / Indicators --  poppy, uncomfortable   Aggravating Factors  reaching overhead   Multiple Pain Sites Yes   Pain Score 7  up to 8/10   Pain Location Back   Pain Orientation Lower   Pain Descriptors / Indicators --  like I need something to hold me up/brace me   Aggravating Factors  lifting, exercise, bending forward   Pain Relieving Factors rest            [facility] PT Assessment - 02/18/16 0001      Assessment   Medical Diagnosis back pain, cervicalgia   Referring Provider Marilynne Drivers, PA   Hand Dominance Right   Prior Therapy no     Precautions   Precautions None     Restrictions  Weight Bearing Restrictions No     Balance Screen   Has the patient fallen in the past 6 months No     [place] residence   Living Arrangements Alone   Additional Comments no stairs     Prior Function   Level of Independence Independent     Cognition   Overall Cognitive Status Within Functional Limits for tasks assessed     Observation/Other Assessments   Focus on Therapeutic Outcomes (FOTO)  31% ability     Sensation   Additional Comments occasional tingling in R leg and arm     ROM / Strength   AROM / PROM / Strength AROM;Strength     AROM   Overall AROM  Within functional limits for tasks performed   Overall AROM Comments beg from resting lumbar lordosis 20 deg at L4   AROM Assessment Site Lumbar   Lumbar Flexion 50  pain     Strength   Strength Assessment Site Shoulder;Hip   Right/Left Shoulder Right;Left   Right Shoulder Internal Rotation 4/5   Right Shoulder External  Rotation 4/5   Left Shoulder Internal Rotation 4/5   Left Shoulder External Rotation 4/5   Right/Left Hip Right;Left   Right Hip Flexion 3+/5   Right Hip Extension 3+/5   Right Hip ABduction 3+/5   Left Hip Flexion 3+/5   Left Hip Extension 3+/5   Left Hip ABduction 3+/5     Palpation   Palpation comment bilat QL TTP                           PT Education - 02/18/16 1239    Education provided Yes   Education Details anatomy of condition, POC, HEP, exercise form/rationale, scheduling for other specialist appointments, bra wear   Person(s) Educated Patient   Methods Explanation;Demonstration;Tactile cues;Verbal cues;Handout   Comprehension Verbalized understanding;Returned demonstration;Verbal cues required;Tactile cues required;Need further instruction          PT Short Term Goals - 02/18/16 1250      PT SHORT TERM GOAL #1   Title Pt will have increased her walking to 3/week to improve full body endurance and challenge functional gait by 3/2   Baseline pt wants to lose weight but only walking 1 or 2 a week.    Time 3   Period Weeks   Status New     PT SHORT TERM GOAL #2   Title Pt will demo bilat GHJ flexion overhead without increase in cervical pain   Baseline increased pain at eval   Time 3   Period Weeks   Status New     PT SHORT TERM GOAL #3   Title Pt will verbalize ability to utilize abdoinal engagement during daily activities to decrease back pain   Baseline began educating at eval   Time 3   Period Weeks   Status New           PT Long Term Goals - 02/18/16 1253      PT LONG TERM GOAL #1   Title FOTO to 47% ability to indicate significant functional improvement by 3/23   Baseline 31% ability at eval   Time 6   Period Weeks   Status New     PT LONG TERM GOAL #2   Title Pt will demo 4+/5 MMT to indicate improved periscapular and lumbo pelvic stability   Baseline see flowsheet   Time 6  Period Weeks   Status New     PT LONG  TERM GOAL #3   Title Pt will be able to demo use of gym machines as well as proper use of free weights for return to gym program   Baseline did not evaluate at eval, will progress toward this as appropriate   Time 6   Period Weeks   Status New     PT LONG TERM GOAL #4   Title pt will verbalize average pain <=4/10 with dialy activities to decrease pain effects on ADLs   Baseline reported avg 7-8/10 pain today   Time 6   Period Weeks   Status New               Plan - 02-Mar-2016 1242    Clinical Impression Statement Pt presents to PT with complaints of back and neck pain. She also presented complaints of eye, R hand, rectal, vaginal and R foot pain. Pt cervical pain is decreased with upright posture and will benefit from postural strengthening and endurance. discussed bra wear to decrease pulling from shoulders. Pt demo increased lumbar lordosis with poor abdominal and hip strength resulting in overuse of low back musculature and pain. I strongly recommended to pt that she schedule for appointments that she was referred to to address rectal and vaginal pain. Will progress strengthening exercises as tolerated to meet goals and reduce pain.    Rehab Potential Fair   PT Frequency 2x / week   PT Duration 6 weeks   PT Treatment/Interventions ADLs/Self Care Home Management;Cryotherapy;Moist Heat;Ultrasound;Gait training;Stair training;Functional mobility training;Patient/family education;Neuromuscular re-education;Balance training;Therapeutic exercise;Therapeutic activities;Manual techniques;Passive range of motion;Taping   PT Next Visit Plan periscapular strength, lumbo pelvic strength   PT Home Exercise Plan abdominal engagement, scpaular retraction, UE ER yellow tband   Consulted and Agree with Plan of Care Patient      Patient will benefit from skilled therapeutic intervention in order to improve the following deficits and impairments:  Impaired UE functional use, Increased muscle spasms,  Decreased activity tolerance, Pain, Improper body mechanics, Decreased strength, Postural dysfunction  Visit Diagnosis: Cervicalgia - Plan: PT plan of care cert/re-cert  Chronic right-sided low back pain, with sciatica presence unspecified - Plan: PT plan of care cert/re-cert  Muscle weakness (generalized) - Plan: PT plan of care cert/re-cert  Difficulty in walking, not elsewhere classified - Plan: PT plan of care cert/re-cert  Neuropathy (HCC)      G-Codes - 2016/03/02 1259    Functional Assessment Tool Used FOTO 31% ability (goal 47%)   Functional Limitation Mobility: Walking and moving around   Mobility: Walking and Moving Around Current Status [id number]) At least 60 percent but less than 80 percent impaired, limited or restricted   Mobility: Walking and Moving Around Goal Status [id number]) At least 40 percent but less than 60 percent impaired, limited or restricted       Problem List Patient Active Problem List   Diagnosis Date Noted  . Esophageal reflux 03-02-2016  . Neuropathy ([place]) Mar 02, 2016    Danetta Prom C. Beda Dula PT, DPT 02-Mar-2016 1:10 PM   [place], [place], [address] Phone: [phone]   Fax:  [phone]  Name: NURAH BOEGER MRN: [id number] Date of Birth: 1940/11/18

## 2016-02-29 ENCOUNTER — Encounter: Payer: Self-pay | Admitting: Neurology

## 2016-02-29 ENCOUNTER — Ambulatory Visit (INDEPENDENT_AMBULATORY_CARE_PROVIDER_SITE_OTHER): Payer: PPO | Admitting: Neurology

## 2016-02-29 DIAGNOSIS — G513 Clonic hemifacial spasm: Secondary | ICD-10-CM

## 2016-02-29 DIAGNOSIS — G5139 Clonic hemifacial spasm, unspecified: Secondary | ICD-10-CM

## 2016-02-29 NOTE — Progress Notes (Signed)
PATIENT: Alexandra Gonzales DOB: 1940/11/01  Chief Complaint  Patient presents with  . Hemifacial Spasm    Reports facial spasms present for the last 2-3 months, left side worse than right.  . Ophthalmology    Clent Jacks, MD - referring MD  . PCP    Shirline Frees, MD     HISTORICAL  Alexandra Gonzales is a 76 years old right handed female, seen in refer by ophthamologist Dr. Clent Jacks for evaluation of facial spasm, her primary care is Dr.  Shirline Frees, initial evaluation was on Feb 29 2016.  She had history of  right breast cancer in 2004, had right lobectomy followed by radiation therapy, HTN, bilateral cataract,  She started to noticed facial twitching since early 2018, intermittent, left worse than right, feels like there are some thing in her left eye, irritable. It is more of a nuisance for her.  There is no limitation on her daily function.  She denies a history of Bell's palsy, she has mild right TMJ joint pain,   REVIEW OF SYSTEMS: Full 14 system review of systems performed and notable only for as above ALLERGIES: Allergies  Allergen Reactions  . Morphine And Related     HOME MEDICATIONS: Current Outpatient Prescriptions  Medication Sig Dispense Refill  . enalapril (VASOTEC) 20 MG tablet Take 20 mg by mouth daily.    . pantoprazole (PROTONIX) 40 MG tablet Take 40 mg by mouth daily.    . Probiotic Product (PROBIOTIC-10) CAPS Take by mouth.    Marland Kitchen tiZANidine (ZANAFLEX) 2 MG tablet as needed.     No current facility-administered medications for this visit.     PAST MEDICAL HISTORY: Past Medical History:  Diagnosis Date  . Allergy   . Anxiety   . Cancer [facility])    breast cancer 2004  . Hyperlipidemia   . Hypertension   . IBS (irritable bowel syndrome)   . Insomnia   . Kidney stones   . Osteopenia   . Overactive bladder     PAST SURGICAL HISTORY: Past Surgical History:  Procedure Laterality Date  . ABDOMINAL HYSTERECTOMY    . BREAST SURGERY     R  lumpectomy 2000  . EXPLORATORY LAPAROTOMY    . EYE SURGERY     bilat cataract removal  . MOUTH SURGERY    . TONSILLECTOMY      FAMILY HISTORY: Family History  Problem Relation Age of Onset  . Congestive Heart Failure Mother     Died 74  . Kidney Stones Father     Died 48 - complications from kidney stone    SOCIAL HISTORY:  Social History   Social History  . Marital status: Widowed    Spouse name: N/A  . Number of children: 2  . Years of education: 10th   Occupational History  . Retired    Social History Main Topics  . Smoking status: Never Smoker  . Smokeless tobacco: Never Used  . Alcohol use No  . Drug use: No  . Sexual activity: Not on file   Other Topics Concern  . Not on file   Social History Narrative   Lives at home alone.   Right-handed.   Occasional caffeine use.     PHYSICAL EXAM   Vitals:   02/29/16 0728  BP: (!) 163/79  Pulse: (!) 58  Weight: 151 lb (68.5 kg)  Height: 5' 2.5" (1.588 m)    Not recorded      Body mass  index is 27.18 kg/m.  PHYSICAL EXAMNIATION:  Gen: NAD, conversant, well nourised, obese, well groomed                     Cardiovascular: Regular rate rhythm, no peripheral edema, warm, nontender. Eyes: Conjunctivae clear without exudates or hemorrhage Neck: Supple, no carotid bruits. Pulmonary: Clear to auscultation bilaterally   NEUROLOGICAL EXAM:  MENTAL STATUS: Speech:    Speech is normal; fluent and spontaneous with normal comprehension.  Cognition:     Orientation to time, place and person     Normal recent and remote memory     Normal Attention span and concentration     Normal Language, naming, repeating,spontaneous speech     Fund of knowledge   CRANIAL NERVES: CN II: Visual fields are full to confrontation. Fundoscopic exam is normal with sharp discs and no vascular changes. Pupils are round equal and briskly reactive to light. CN III, IV, VI: extraocular movement are normal. No ptosis. CN V:  Facial sensation is intact to pinprick in all 3 divisions bilaterally. Corneal responses are intact.  CN VII: Face is symmetric with normal eye closure and smile.  She has intermittent bilateral lower orbicularis oculi muscle twitching CN VIII: Hearing is normal to rubbing fingers CN IX, X: Palate elevates symmetrically. Phonation is normal. CN XI: Head turning and shoulder shrug are intact CN XII: Tongue is midline with normal movements and no atrophy.  MOTOR: There is no pronator drift of out-stretched arms. Muscle bulk and tone are normal. Muscle strength is normal.  REFLEXES: Reflexes are 2+ and symmetric at the biceps, triceps, knees, and ankles. Plantar responses are flexor.  SENSORY: Intact to light touch, pinprick, positional sensation and vibratory sensation are intact in fingers and toes.  COORDINATION: Rapid alternating movements and fine finger movements are intact. There is no dysmetria on finger-to-nose and heel-knee-shin.    GAIT/STANCE: Posture is normal. Gait is steady with normal steps, base, arm swing, and turning. Heel and toe walking are normal. Tandem gait is normal.  Romberg is absent.   DIAGNOSTIC DATA (LABS, IMAGING, TESTING) - I reviewed patient records, labs, notes, testing and imaging myself where available.   ASSESSMENT AND PLAN  Alexandra Gonzales is a 76 y.o. female   Mild bilateral lower orbicularis oculi muscle fasciculation, no limitation on her daily function,  After discuss with patient, we decided to continue to observe her symptoms, if her symptoms worsened, may consider EMG guided low dose Botulinum toxin injections.   Marcial Pacas, M.D. Ph.D.  [facility] [address], [place], [place] [address] Ph: [phone] Fax: [phone]  CC: Clent Jacks, MD, Shirline Frees, MD

## 2016-03-01 DIAGNOSIS — I10 Essential (primary) hypertension: Secondary | ICD-10-CM | POA: Diagnosis not present

## 2016-03-01 DIAGNOSIS — K219 Gastro-esophageal reflux disease without esophagitis: Secondary | ICD-10-CM | POA: Diagnosis not present

## 2016-03-01 DIAGNOSIS — M5416 Radiculopathy, lumbar region: Secondary | ICD-10-CM | POA: Diagnosis not present

## 2016-03-01 DIAGNOSIS — E7800 Pure hypercholesterolemia, unspecified: Secondary | ICD-10-CM | POA: Diagnosis not present

## 2016-03-01 DIAGNOSIS — M8588 Other specified disorders of bone density and structure, other site: Secondary | ICD-10-CM | POA: Diagnosis not present

## 2016-03-01 DIAGNOSIS — Z0000 Encounter for general adult medical examination without abnormal findings: Secondary | ICD-10-CM | POA: Diagnosis not present

## 2016-03-09 ENCOUNTER — Ambulatory Visit: Payer: PPO | Attending: Family Medicine | Admitting: Physical Therapy

## 2016-03-09 ENCOUNTER — Encounter: Payer: Self-pay | Admitting: Physical Therapy

## 2016-03-09 DIAGNOSIS — M6281 Muscle weakness (generalized): Secondary | ICD-10-CM

## 2016-03-09 DIAGNOSIS — M542 Cervicalgia: Secondary | ICD-10-CM | POA: Diagnosis not present

## 2016-03-09 DIAGNOSIS — M545 Low back pain: Secondary | ICD-10-CM | POA: Insufficient documentation

## 2016-03-09 DIAGNOSIS — R262 Difficulty in walking, not elsewhere classified: Secondary | ICD-10-CM | POA: Diagnosis not present

## 2016-03-09 DIAGNOSIS — G8929 Other chronic pain: Secondary | ICD-10-CM | POA: Diagnosis not present

## 2016-03-09 NOTE — Therapy (Signed)
[facility] [address] [place], [place], [address] Phone: [phone]   Fax:  [phone]  Physical Therapy Treatment  Patient Details  Name: Alexandra Gonzales MRN: [id number] Date of Birth: 01-05-1941 Referring Provider: Marilynne Drivers, PA  Encounter Date: 03/09/2016      PT End of Session - 03/09/16 0853    Visit Number 2   Number of Visits 13   Date for PT Re-Evaluation 04/01/16   Authorization Type MCR advan KX at visit 15   PT Start Time 0846   PT Stop Time 0945   PT Time Calculation (min) 59 min   Activity Tolerance Patient tolerated treatment well   Behavior During Therapy [facility] for tasks assessed/performed      Past Medical History:  Diagnosis Date  . Allergy   . Anxiety   . Cancer [facility])    breast cancer 2004  . Hyperlipidemia   . Hypertension   . IBS (irritable bowel syndrome)   . Insomnia   . Kidney stones   . Osteopenia   . Overactive bladder     Past Surgical History:  Procedure Laterality Date  . ABDOMINAL HYSTERECTOMY    . BREAST SURGERY     R lumpectomy 2000  . EXPLORATORY LAPAROTOMY    . EYE SURGERY     bilat cataract removal  . MOUTH SURGERY    . TONSILLECTOMY      There were no vitals filed for this visit.      Subjective Assessment - 03/09/16 0856    Subjective Pt's pain not too bad today. She reports mowing a little the other day and  her back felt much better after doing so.  Pt was completely open to retunring to the gym she just wants guidance on what to do.     Currently in Pain? Yes  RT leg and buttocks 2/10, neck 2/10   Pain Orientation Right   Pain Descriptors / Indicators Dull   Aggravating Factors  Nothing specific noted today   Pain Relieving Factors Heat feels good, laying down, using a towel roll to support her neck when laying down.                         [facility] PT Treatment/Exercise - 03/09/16 0001      Lumbar Exercises: Stretches   Active Hamstring Stretch 3 reps;10 seconds  RT seated   Single Knee to Chest Stretch 3 reps;10 seconds     Lumbar Exercises: Aerobic   Stationary Bike Nustep L1 x 5 min  Pt requires a towel roll to support her back.     Lumbar Exercises: Supine   Bridge 10 reps;3 seconds     Moist Heat Therapy   Number Minutes Moist Heat 15 Minutes   Moist Heat Location --  Neck and back in hooklying post tx                PT Education - 03/09/16 0902    Education Details Posture and body mechanics, using the Nustep at the gym, walking posture, Single knee to chest stretch, hamstring stretch and supine bridge for HEP.    Person(s) Educated Patient   Methods Explanation;Demonstration;Tactile cues;Verbal cues;Handout   Comprehension Verbalized understanding;Returned demonstration          PT Short Term Goals - 02/18/16 1250      PT SHORT TERM GOAL #1   Title Pt will have increased her walking to  3/week to improve full body endurance and challenge functional gait by 3/2   Baseline pt wants to lose weight but only walking 1 or 2 a week.    Time 3   Period Weeks   Status New     PT SHORT TERM GOAL #2   Title Pt will demo bilat GHJ flexion overhead without increase in cervical pain   Baseline increased pain at eval   Time 3   Period Weeks   Status New     PT SHORT TERM GOAL #3   Title Pt will verbalize ability to utilize abdoinal engagement during daily activities to decrease back pain   Baseline began educating at eval   Time 3   Period Weeks   Status New           PT Long Term Goals - 02/18/16 1253      PT LONG TERM GOAL #1   Title FOTO to 47% ability to indicate significant functional improvement by 3/23   Baseline 31% ability at eval   Time 6   Period Weeks   Status New     PT LONG TERM GOAL #2   Title Pt will demo 4+/5 MMT to indicate improved periscapular and lumbo pelvic stability   Baseline see flowsheet   Time 6   Period Weeks   Status New     PT LONG  TERM GOAL #3   Title Pt will be able to demo use of gym machines as well as proper use of free weights for return to gym program   Baseline did not evaluate at eval, will progress toward this as appropriate   Time 6   Period Weeks   Status New     PT LONG TERM GOAL #4   Title pt will verbalize average pain <=4/10 with dialy activities to decrease pain effects on ADLs   Baseline reported avg 7-8/10 pain today   Time 6   Period Weeks   Status New               Plan - 03/09/16 [id number]    Clinical Impression Statement Pt had very little awareness of her posture and how to use more supportive body mechanics during her ADLS.  She is very motivated to exercise and to do what she can to help herself out. She will try the Nustep at her gym with lumbar support and try the Senior exercise class omitting reaching over her head and jumping.    Rehab Potential Fair   PT Frequency 2x / week   PT Duration 6 weeks   PT Treatment/Interventions ADLs/Self Care Home Management;Cryotherapy;Moist Heat;Ultrasound;Gait training;Stair training;Functional mobility training;Patient/family education;Neuromuscular re-education;Balance training;Therapeutic exercise;Therapeutic activities;Manual techniques;Passive range of motion;Taping   PT Next Visit Plan Progress core strength and postural strength and endurance   Consulted and Agree with Plan of Care --      Patient will benefit from skilled therapeutic intervention in order to improve the following deficits and impairments:  Impaired UE functional use, Increased muscle spasms, Decreased activity tolerance, Pain, Improper body mechanics, Decreased strength, Postural dysfunction  Visit Diagnosis: Cervicalgia  Chronic right-sided low back pain, with sciatica presence unspecified  Muscle weakness (generalized)  Difficulty in walking, not elsewhere classified     Problem List Patient Active Problem List   Diagnosis Date Noted  . Facial spasm  02/29/2016  . Esophageal reflux 02/18/2016  . Neuropathy ([place]) 02/18/2016    Carmeron Heady, PTA 03/09/2016, 12:18 PM  [facility]  [place] [address], [place], [place], [address] Phone: [phone]   Fax:  [phone]  Name: Alexandra Gonzales MRN: [id number] Date of Birth: 1940/01/20

## 2016-03-09 NOTE — Patient Instructions (Signed)
Posture Tips DO: - stand tall and erect - keep chin tucked in - keep head and shoulders in alignment - check posture regularly in mirror or large window - pull head back against headrest in car seat;  Change your position often.  Sit with lumbar support. DON'T: - slouch or slump while watching TV or reading - sit, stand or lie in one position  for too long;  Sitting is especially hard on the spine so if you sit at a desk/use the computer, then stand up often!   Copyright  VHI. All rights reserved.  Posture - Standing   Good posture is important. Avoid slouching and forward head thrust. Maintain curve in low back and align ears over shoul- ders, hips over ankles.  Pull your belly button in toward your back bone.   Copyright  VHI. All rights reserved.  Posture - Sitting   Sit upright, head facing forward. Try using a roll to support lower back. Keep shoulders relaxed, and avoid rounded back. Keep hips level with knees. Avoid crossing legs for long periods.   Copyright  VHI. All rights reserved.  Sleeping on Back  Place pillow under knees. A pillow with cervical support and a roll around waist are also helpful. Copyright  VHI. All rights reserved.  Sleeping on Side Place pillow between knees. Use cervical support under neck and a roll around waist as needed. Copyright  VHI. All rights reserved.   Sleeping on Stomach   If this is the only desirable sleeping position, place pillow under lower legs, and under stomach or chest as needed.  Posture - Sitting   Sit upright, head facing forward. Try using a roll to support lower back. Keep shoulders relaxed, and avoid rounded back. Keep hips level with knees. Avoid crossing legs for long periods. Stand to Sit / Sit to Stand   To sit: Bend knees to lower self onto front edge of chair, then scoot back on seat. To stand: Reverse sequence by placing one foot forward, and scoot to front of seat. Use rocking motion to stand up.   Work  Height and Reach  Ideal work height is no more than 2 to 4 inches below elbow level when standing, and at elbow level when sitting. Reaching should be limited to arm's length, with elbows slightly bent.  Bending  Bend at hips and knees, not back. Keep feet shoulder-width apart.    Posture - Standing   Good posture is important. Avoid slouching and forward head thrust. Maintain curve in low back and align ears over shoul- ders, hips over ankles.  Alternating Positions   Alternate tasks and change positions frequently to reduce fatigue and muscle tension. Take rest breaks. Computer Work   Position work to Programmer, multimedia. Use proper work and seat height. Keep shoulders back and down, wrists straight, and elbows at right angles. Use chair that provides full back support. Add footrest and lumbar roll as needed.  Getting Into / Out of Car  Lower self onto seat, scoot back, then bring in one leg at a time. Reverse sequence to get out.  Dressing  Lie on back to pull socks or slacks over feet, or sit and bend leg while keeping back straight.    Housework - Sink  Place one foot on ledge of cabinet under sink when standing at sink for prolonged periods.   Pushing / Pulling  Pushing is preferable to pulling. Keep back in proper alignment, and use leg muscles to do the  work.  Energy manager and lift with both arms held against upper trunk. Tighten stomach muscles without holding breath. Use smooth movements to avoid jerking.  Avoid Twisting   Avoid twisting or bending back. Pivot around using foot movements, and bend at knees if needed when reaching for articles.  Carrying Luggage   Distribute weight evenly on both sides. Use a cart whenever possible. Do not twist trunk. Move body as a unit.   Lifting Principles .Maintain proper posture and head alignment. .Slide object as close as possible before lifting. .Move obstacles out of the way. .Test before lifting; ask for  help if too heavy. .Tighten stomach muscles without holding breath. .Use smooth movements; do not jerk. .Use legs to do the work, and pivot with feet. .Distribute the work load symmetrically and close to the center of trunk. .Push instead of pull whenever possible.   Ask For Help   Ask for help and delegate to others when possible. Coordinate your movements when lifting together, and maintain the low back curve.  Log Roll   Lying on back, bend left knee and place left arm across chest. Roll all in one movement to the right. Reverse to roll to the left. Always move as one unit. Housework - Sweeping  Use long-handled equipment to avoid stooping.   Housework - Wiping  Position yourself as close as possible to reach work surface. Avoid straining your back.  Laundry - Unloading Wash   To unload small items at bottom of washer, lift leg opposite to arm being used to reach.  [employer] close to area to be raked. Use arm movements to do the work. Keep back straight and avoid twisting.     Cart  When reaching into cart with one arm, lift opposite leg to keep back straight.   Getting Into / Out of Bed  Lower self to lie down on one side by raising legs and lowering head at the same time. Use arms to assist moving without twisting. Bend both knees to roll onto back if desired. To sit up, start from lying on side, and use same move-ments in reverse. Housework - Vacuuming  Hold the vacuum with arm held at side. Step back and forth to move it, keeping head up. Avoid twisting.   Laundry - IT consultant so that bending and twisting can be avoided.   Laundry - Unloading Dryer  Squat down to reach into clothes dryer or use a reacher.  Gardening - Weeding / Probation officer or Kneel. Knee pads may be helpful.                  Supine Knee to Chest    [facility] your left knee. Flexors, Supine Bridge    Lie supine, feet  shoulder-width apart. Lift hips toward ceiling. Hold _3__ seconds. Repeat 5-10_ times per session. Do _1-2Hamstring Step 3  Repeat __3_ times per session. Do _2__ sessions per day.  Copyright  VHI. All rights reserved.     #4  Seated back of the leg stretch:  Sit on the edge of the bed/chair and straighten your right leg with your RT heel bone on the floor. Practice straightening your leg gently and pulling the toes back gently increasing your stretch. Hold for 10-15 seconds and do 3x. Practice this 3x day.

## 2016-03-10 ENCOUNTER — Other Ambulatory Visit: Payer: Self-pay | Admitting: Family Medicine

## 2016-03-10 DIAGNOSIS — M8588 Other specified disorders of bone density and structure, other site: Secondary | ICD-10-CM

## 2016-03-14 ENCOUNTER — Encounter: Payer: Self-pay | Admitting: Physical Therapy

## 2016-03-14 ENCOUNTER — Ambulatory Visit: Payer: PPO | Attending: Family Medicine | Admitting: Physical Therapy

## 2016-03-14 DIAGNOSIS — R262 Difficulty in walking, not elsewhere classified: Secondary | ICD-10-CM | POA: Insufficient documentation

## 2016-03-14 DIAGNOSIS — M542 Cervicalgia: Secondary | ICD-10-CM | POA: Diagnosis not present

## 2016-03-14 DIAGNOSIS — G8929 Other chronic pain: Secondary | ICD-10-CM | POA: Diagnosis not present

## 2016-03-14 DIAGNOSIS — M545 Low back pain: Secondary | ICD-10-CM | POA: Insufficient documentation

## 2016-03-14 DIAGNOSIS — M6281 Muscle weakness (generalized): Secondary | ICD-10-CM | POA: Insufficient documentation

## 2016-03-14 NOTE — Patient Instructions (Addendum)
  PNF Strengthening: [employer] on your back.  With resistive band around each hand, bring right arm up and away, thumb back. Repeat _10___ times per set. Do 1__ sets per session. Do _1-2___ sessions per day.      Resisted Horizontal Abduction: Bilateral   Laying on your back to start. Tubing in both hands, arms out in front. Keeping arms straight, pinch shoulder blades together and stretch arms out. Repeat _15__ times per set. Do ___ sets per session. Do _1-2___ sessions per day.   #3: Standing, place red band around door knob or banister, whatever works in your home. Wrap the ends around your hands, standing tall with your lower abdominals IN, bend the elbows behind you. Keep arms by your side. This should be like a rowing motion or starting a lawn mower. Move SLOWLY forward and back 10x. Practice keeping your neck SOFT!! Do 10x:  2x a day.

## 2016-03-14 NOTE — Therapy (Signed)
[facility] [address] [place], [place], [address] Phone: [phone]   Fax:  [phone]  Physical Therapy Treatment  Patient Details  Name: Alexandra Gonzales MRN: [id number] Date of Birth: [birth year] Referring Provider: Marilynne Drivers, PA  Encounter Date: 03/14/2016      PT End of Session - 03/14/16 1520    Visit Number 3   Number of Visits 13   Date for PT Re-Evaluation 04/01/16   Authorization Type MCR advan KX at visit 15   PT Start Time 1520   PT Stop Time 1605   PT Time Calculation (min) 45 min   Activity Tolerance Patient tolerated treatment well   Behavior During Therapy [facility] for tasks assessed/performed      Past Medical History:  Diagnosis Date  . Allergy   . Anxiety   . Cancer [facility])    breast cancer 2004  . Hyperlipidemia   . Hypertension   . IBS (irritable bowel syndrome)   . Insomnia   . Kidney stones   . Osteopenia   . Overactive bladder     Past Surgical History:  Procedure Laterality Date  . ABDOMINAL HYSTERECTOMY    . BREAST SURGERY     R lumpectomy 2000  . EXPLORATORY LAPAROTOMY    . EYE SURGERY     bilat cataract removal  . MOUTH SURGERY    . TONSILLECTOMY      There were no vitals filed for this visit.      Subjective Assessment - 03/14/16 1520    Subjective Back did well up until yesterday when she had some pain. Using a pillow for her back which is helping. Has not gotten her new bra yet.    Currently in Pain? No/denies   Multiple Pain Sites No                         [facility] Adult PT Treatment/Exercise - 03/14/16 0001      Lumbar Exercises: Aerobic   Stationary Bike L2 x 8 min  Review of status and goals.     Lumbar Exercises: Standing   Row Strengthening;Both;10 reps;Theraband   Theraband Level (Row) Level 2 (Red)     Lumbar Exercises: Supine   Bridge 20 reps;2 seconds     Shoulder Exercises: Pulleys   Flexion 3 minutes     Shoulder Exercises:  ROM/Strengthening   UBE (Upper Arm Bike) L1 2x 2 min with lumbar support     Moist Heat Therapy   Number Minutes Moist Heat --  Concurrent with supine exs neck and back                PT Education - 03/14/16 1532    Education provided Yes   Education Details red band supine unattached ex for posture. Also red band standing rows for HEP.    Person(s) Educated Patient;Spouse   Methods Explanation;Demonstration;Tactile cues;Verbal cues;Handout   Comprehension Verbalized understanding;Returned demonstration          PT Short Term Goals - 03/14/16 1539      PT SHORT TERM GOAL #1   Title Pt will have increased her walking to 3/week to improve full body endurance and challenge functional gait by 3/2   Period Weeks   Status Achieved     PT SHORT TERM GOAL #3   Title Pt will verbalize ability to utilize abdoinal engagement during daily activities to decrease back pain   Time 3  Period Weeks   Status Achieved           PT Long Term Goals - 02/18/16 1253      PT LONG TERM GOAL #1   Title FOTO to 47% ability to indicate significant functional improvement by 3/23   Baseline 31% ability at eval   Time 6   Period Weeks   Status New     PT LONG TERM GOAL #2   Title Pt will demo 4+/5 MMT to indicate improved periscapular and lumbo pelvic stability   Baseline see flowsheet   Time 6   Period Weeks   Status New     PT LONG TERM GOAL #3   Title Pt will be able to demo use of gym machines as well as proper use of free weights for return to gym program   Baseline did not evaluate at eval, will progress toward this as appropriate   Time 6   Period Weeks   Status New     PT LONG TERM GOAL #4   Title pt will verbalize average pain <=4/10 with dialy activities to decrease pain effects on ADLs   Baseline reported avg 7-8/10 pain today   Time 6   Period Weeks   Status New               Plan - 03/14/16 1605    Clinical Impression Statement Pt is coplinat with  her initial HEP but needed some tweeking to her technique when bridging. She reports walking 3x week, meeting STG. She has not gone to the gym to ride the bike or try Nustep at this time. We added postural strength and endurance exercises to her HEP for this week. She reports she is trying to remember her posture with her ADLs. Most difficulty is her shoulders as she feels the weight of her breasts is hard to overcome.     Rehab Potential Fair   PT Frequency 2x / week   PT Duration 6 weeks   PT Treatment/Interventions ADLs/Self Care Home Management;Cryotherapy;Moist Heat;Ultrasound;Gait training;Stair training;Functional mobility training;Patient/family education;Neuromuscular re-education;Balance training;Therapeutic exercise;Therapeutic activities;Manual techniques;Passive range of motion;Taping   PT Next Visit Plan Progress core strength and postural strength and endurance, review last HEP given. Measure Shoulder flexion for goals next.    Consulted and Agree with Plan of Care Patient      Patient will benefit from skilled therapeutic intervention in order to improve the following deficits and impairments:  Impaired UE functional use, Increased muscle spasms, Decreased activity tolerance, Pain, Improper body mechanics, Decreased strength, Postural dysfunction  Visit Diagnosis: Cervicalgia  Chronic right-sided low back pain, with sciatica presence unspecified  Muscle weakness (generalized)     Problem List Patient Active Problem List   Diagnosis Date Noted  . Facial spasm 02/29/2016  . Esophageal reflux 02/18/2016  . Neuropathy ([place]) 02/18/2016    Zaine Elsass, PTA 03/14/2016, 4:13 PM  [facility] [address], [place], [place], [address] Phone: [phone]   Fax:  [phone]  Name: Alexandra Gonzales MRN: [id number] Date of Birth: [birth year]

## 2016-03-16 ENCOUNTER — Encounter: Payer: PPO | Admitting: Physical Therapy

## 2016-03-17 ENCOUNTER — Telehealth: Payer: Self-pay | Admitting: Neurology

## 2016-03-17 NOTE — Telephone Encounter (Signed)
Patient calling to discuss having Botox.

## 2016-03-17 NOTE — Telephone Encounter (Signed)
Patient would like to proceed with getting procedure approved with insurance.  She would like a call with an estimated cost prior to scheduling treatment.  Dr. Krista Blue has ordered Xeomin 50units w/ EMG.  Andee Poles will research and contact patient.

## 2016-03-21 ENCOUNTER — Encounter: Payer: PPO | Admitting: Physical Therapy

## 2016-03-23 ENCOUNTER — Encounter: Payer: PPO | Admitting: Physical Therapy

## 2016-03-24 NOTE — Telephone Encounter (Addendum)
I spoke with the patient today regarding her injections. She wanted to know the cost of the injections I told her that we can only give her an estimate but I offered to call her insurance and ask if they could give me an estimate of her copay and I told her I would call her back.   I called her insurance and they told me there would be a 20 percent co pay.  I called the patient back to give her this information. She then asked me if Dr. Krista Blue wanted her to have an MRI. I did not see this in her assessment plan so I told her I would send a note over to the nurse regarding the MRI. She would like to think about the injections and call us back once she decides. I told her that was perfectly fine. She has requested a call back from the nurse regarding the MRI.

## 2016-03-24 NOTE — Telephone Encounter (Signed)
Spoke to patient - she is currently doing PT for hip and neck pain.  She has also recently had a dental procedure. She would like to get these medical issues resolved prior to trying Xeomin  for facial spasms.  She will call back when ready to schedule.

## 2016-03-28 ENCOUNTER — Encounter: Payer: Self-pay | Admitting: Physical Therapy

## 2016-03-28 ENCOUNTER — Encounter: Payer: PPO | Admitting: Physical Therapy

## 2016-03-28 ENCOUNTER — Ambulatory Visit: Payer: PPO | Admitting: Physical Therapy

## 2016-03-28 DIAGNOSIS — M542 Cervicalgia: Secondary | ICD-10-CM | POA: Diagnosis not present

## 2016-03-28 DIAGNOSIS — M6281 Muscle weakness (generalized): Secondary | ICD-10-CM

## 2016-03-28 DIAGNOSIS — M545 Low back pain: Secondary | ICD-10-CM

## 2016-03-28 DIAGNOSIS — G8929 Other chronic pain: Secondary | ICD-10-CM

## 2016-03-28 DIAGNOSIS — R262 Difficulty in walking, not elsewhere classified: Secondary | ICD-10-CM

## 2016-03-28 NOTE — Therapy (Signed)
[facility] [address], [place], [place], [address] Phone: [phone]   Fax:  [phone]  Physical Therapy Treatment  Patient Details  Name: Alexandra Gonzales MRN: [id number] Date of Birth: Oct 04, 1940 Referring Provider: Marilynne Drivers, PA  Encounter Date: 03/28/2016      PT End of Session - 03/28/16 1401    Visit Number 4   Number of Visits 13   Date for PT Re-Evaluation 04/01/16   Authorization Type MCR advan KX at visit 15   PT Start Time 1400   PT Stop Time 1445   PT Time Calculation (min) 45 min   Activity Tolerance Patient tolerated treatment well   Behavior During Therapy [facility] for tasks assessed/performed      Past Medical History:  Diagnosis Date  . Allergy   . Anxiety   . Cancer [facility])    breast cancer 2004  . Hyperlipidemia   . Hypertension   . IBS (irritable bowel syndrome)   . Insomnia   . Kidney stones   . Osteopenia   . Overactive bladder     Past Surgical History:  Procedure Laterality Date  . ABDOMINAL HYSTERECTOMY    . BREAST SURGERY     R lumpectomy 2000  . EXPLORATORY LAPAROTOMY    . EYE SURGERY     bilat cataract removal  . MOUTH SURGERY    . TONSILLECTOMY      There were no vitals filed for this visit.      Subjective Assessment - 03/28/16 1402    Subjective Neck is better if she does not wear her bra. Warm weather is better for her back and neck.    Currently in Pain? No/denies   Aggravating Factors  When she wears her bra or over does activity like cleaning floors.   Pain Relieving Factors Heat, rest                         [facility] Adult PT Treatment/Exercise - 03/28/16 0001      Self-Care   Self-Care Posture   Posture Posture for head and neck     Lumbar Exercises: Aerobic   Stationary Bike L2 x 10 min     Lumbar Exercises: Supine   Bridge 20 reps;2 seconds     Shoulder Exercises: Supine   Horizontal ABduction Strengthening;Both;20  reps;Theraband   Theraband Level (Shoulder Horizontal ABduction) Level 2 (Red)     Shoulder Exercises: Pulleys   Flexion 3 minutes     Moist Heat Therapy   Number Minutes Moist Heat --  Neck and back concurrent with supine exs     Neck Exercises: Stretches   Neck Stretch --  Sidebend stretch bil 3x10 sec                  PT Short Term Goals - 03/28/16 1423      PT SHORT TERM GOAL #2   Title Pt will demo bilat GHJ flexion overhead without increase in cervical pain   Baseline increased pain at eval   Time 3   Period Weeks   Status Achieved           PT Long Term Goals - 02/18/16 1253      PT LONG TERM GOAL #1   Title FOTO to 47% ability to indicate significant functional improvement by 3/23   Baseline 31% ability at eval   Time 6   Period Weeks   Status New  PT LONG TERM GOAL #2   Title Pt will demo 4+/5 MMT to indicate improved periscapular and lumbo pelvic stability   Baseline see flowsheet   Time 6   Period Weeks   Status New     PT LONG TERM GOAL #3   Title Pt will be able to demo use of gym machines as well as proper use of free weights for return to gym program   Baseline did not evaluate at eval, will progress toward this as appropriate   Time 6   Period Weeks   Status New     PT LONG TERM GOAL #4   Title pt will verbalize average pain <=4/10 with dialy activities to decrease pain effects on ADLs   Baseline reported avg 7-8/10 pain today   Time 6   Period Weeks   Status New               Plan - 03/28/16 1401    Clinical Impression Statement It appears that neck and back pain are intermittent at this time, but pt still points to very specific times when she does hurt: washing the floor, wearing her bra ends up pulling her shoulders. She has a hard time recognizing when she chooses activities ( like washing the floror)  that might be more straingn to her at this time and then why she would hurt after. We spend some time discussing  posture, machines at the gym she can do, and practicing her relaxation techniques at home. She was able to demonstrate liftin gher arms overhead with no pain today, meeting that goal. She also continues to walk 3x a week at her church.,    Rehab Potential Fair   PT Frequency 2x / week   PT Duration 6 weeks   PT Treatment/Interventions ADLs/Self Care Home Management;Cryotherapy;Moist Heat;Ultrasound;Gait training;Stair training;Functional mobility training;Patient/family education;Neuromuscular re-education;Balance training;Therapeutic exercise;Therapeutic activities;Manual techniques;Passive range of motion;Taping   PT Next Visit Plan Progress core strength and postural strength and endurance, review last HEP given.     Consulted and Agree with Plan of Care Patient      Patient will benefit from skilled therapeutic intervention in order to improve the following deficits and impairments:  Impaired UE functional use, Increased muscle spasms, Decreased activity tolerance, Pain, Improper body mechanics, Decreased strength, Postural dysfunction  Visit Diagnosis: Cervicalgia  Chronic right-sided low back pain, with sciatica presence unspecified  Muscle weakness (generalized)  Difficulty in walking, not elsewhere classified     Problem List Patient Active Problem List   Diagnosis Date Noted  . Facial spasm 02/29/2016  . Esophageal reflux 02/18/2016  . Neuropathy ([place]) 02/18/2016    Alexandra Gonzales, PTA 03/28/2016, 2:48 PM  [facility] [address], [place], [place], [address] Phone: [phone]   Fax:  [phone]  Name: Alexandra Gonzales MRN: [id number] Date of Birth: Nov 23, 1940

## 2016-03-28 NOTE — Patient Instructions (Addendum)
Machines:  1: Nustep  2. Recumbent bike:  Use your towel roll to support your back.   Do the red band exercises:  Machines at the gym for posture:  1. Rowing 2. Arms: biceps and triceps machines   POSTURE!! Keep your ear in line with your shoulder     Ear / Shoulder Stretch    Exhaling, move left ear toward left shoulder. Hold position for __3 slow_ breaths. Inhaling, bring head back to center. Repeat to other side. Repeat sequence _3__ times. Do _2__ times per day.  Copyright  VHI. All rights reserved.   Practice your relaxation techniques at home at least 1x day. Use heat if you want, but use your breathing to bring a heavy, relaxed feeling to your bones and muscles.

## 2016-03-30 ENCOUNTER — Encounter: Payer: PPO | Admitting: Physical Therapy

## 2016-03-31 ENCOUNTER — Ambulatory Visit: Payer: PPO

## 2016-03-31 DIAGNOSIS — M6281 Muscle weakness (generalized): Secondary | ICD-10-CM

## 2016-03-31 DIAGNOSIS — G8929 Other chronic pain: Secondary | ICD-10-CM

## 2016-03-31 DIAGNOSIS — R262 Difficulty in walking, not elsewhere classified: Secondary | ICD-10-CM

## 2016-03-31 DIAGNOSIS — M542 Cervicalgia: Secondary | ICD-10-CM

## 2016-03-31 DIAGNOSIS — M545 Low back pain: Secondary | ICD-10-CM

## 2016-03-31 NOTE — Therapy (Signed)
[facility] [address], [place], [place], [address] Phone: [phone]   Fax:  [phone]  Physical Therapy Treatment  Patient Details  Name: Alexandra Gonzales MRN: [id number] Date of Birth: 06-20-1940 Referring Provider: Marilynne Drivers, PA  Encounter Date: 03/31/2016      PT End of Session - 03/31/16 1303    Visit Number 5   PT Start Time 5038   PT Stop Time 1313   PT Time Calculation (min) 42 min   Activity Tolerance Patient tolerated treatment well   Behavior During Therapy [facility] for tasks assessed/performed      Past Medical History:  Diagnosis Date  . Allergy   . Anxiety   . Cancer [facility])    breast cancer 2004  . Hyperlipidemia   . Hypertension   . IBS (irritable bowel syndrome)   . Insomnia   . Kidney stones   . Osteopenia   . Overactive bladder     Past Surgical History:  Procedure Laterality Date  . ABDOMINAL HYSTERECTOMY    . BREAST SURGERY     R lumpectomy 2000  . EXPLORATORY LAPAROTOMY    . EYE SURGERY     bilat cataract removal  . MOUTH SURGERY    . TONSILLECTOMY      There were no vitals filed for this visit.      Subjective Assessment - 03/31/16 1231    Subjective I have been exercising at the church by my house.     Currently in Pain? No/denies   Pain Score 8   Pain Location Back   Pain Orientation Lower   Pain Descriptors / Indicators Aching;Sore   Pain Type Chronic pain   Pain Onset More than a month ago   Pain Frequency Constant   Aggravating Factors  wearing bra, activity   Pain Relieving Factors rest, light exercise            [facility] PT Assessment - 03/31/16 0001      Assessment   Medical Diagnosis back pain, cervicalgia     Cognition   Overall Cognitive Status Within Functional Limits for tasks assessed     Observation/Other Assessments   Focus on Therapeutic Outcomes (FOTO)  37% limitation     ROM / Strength   AROM / PROM / Strength Strength     Strength    Strength Assessment Site Knee   Right Shoulder Internal Rotation 4+/5   Right Shoulder External Rotation 4/5   Left Shoulder ABduction 4+/5   Left Shoulder Internal Rotation 4+/5   Left Shoulder External Rotation 4/5   Left Shoulder Horizontal ABduction --  4+/5   Right/Left Hip Right;Left   Right/Left Knee Right;Left   Right Knee Flexion 4+/5   Right Knee Extension 4+/5   Left Knee Flexion 4+/5   Left Knee Extension 4+/5                     [facility] Adult PT Treatment/Exercise - 03/31/16 0001      Lumbar Exercises: Stretches   Single Knee to Chest Stretch 3 reps;20 seconds     Lumbar Exercises: Aerobic   Stationary Bike L2 x 8 min     Lumbar Exercises: Supine   Bridge 20 reps;2 seconds     Modalities   Modalities Moist Heat     Moist Heat Therapy   Number Minutes Moist Heat 15 Minutes   Moist Heat Location Cervical;Lumbar Spine     Neck Exercises: Stretches  Neck Stretch --  Sidebend stretch bil 3x10 sec                PT Education - 04/23/16 1301    Education provided Yes   Education Details bridge, knee to chest   Person(s) Educated Patient   Methods Explanation;Demonstration;Handout   Comprehension Verbalized understanding;Returned demonstration          PT Short Term Goals - 04-23-2016 1237      PT SHORT TERM GOAL #1   Title Pt will have increased her walking to 3/week to improve full body endurance and challenge functional gait by 3/2   Status Achieved     PT SHORT TERM GOAL #2   Title Pt will demo bilat GHJ flexion overhead without increase in cervical pain   Status Achieved     PT SHORT TERM GOAL #3   Title Pt will verbalize ability to utilize abdoinal engagement during daily activities to decrease back pain   Status Achieved           PT Long Term Goals - 04/23/2016 1239      PT LONG TERM GOAL #1   Title FOTO to 47% ability to indicate significant functional improvement by 3/23   Baseline 37% limitation at discharge    Status Achieved     PT LONG TERM GOAL #2   Title Pt will demo 4+/5 MMT to indicate improved periscapular and lumbo pelvic stability   Status Partially Met     PT LONG TERM GOAL #3   Title Pt will be able to demo use of gym machines as well as proper use of free weights for return to gym program   Status Achieved     PT LONG TERM GOAL #4   Title pt will verbalize average pain <=4/10 with dialy activities to decrease pain effects on ADLs   Status Partially Met               Plan - 04-23-2016 1301    Clinical Impression Statement Pt is ready for D/C to HEP and gym exercises.  Pt with improved neck pain and improved yet variable LBP.  Pt reports worst LBP with wearing bra.  Pt with improved strength, postural awareness and ability to exercise/walk with less pain.  Pt will D/C to HEP.   PT Next Visit Plan D/C PT to HEP today.     Consulted and Agree with Plan of Care Patient      Patient will benefit from skilled therapeutic intervention in order to improve the following deficits and impairments:     Visit Diagnosis: Cervicalgia  Chronic right-sided low back pain, with sciatica presence unspecified  Muscle weakness (generalized)  Difficulty in walking, not elsewhere classified       G-Codes - 04-23-16 1249    Functional Assessment Tool Used (Outpatient Only) FOTO: 37% limitation   Functional Limitation Mobility: Walking and moving around   Mobility: Walking and Moving Around Goal Status [id number]) At least 40 percent but less than 60 percent impaired, limited or restricted   Mobility: Walking and Moving Around Discharge Status [id number]) At least 20 percent but less than 40 percent impaired, limited or restricted      Problem List Patient Active Problem List   Diagnosis Date Noted  . Facial spasm 02/29/2016  . Esophageal reflux 02/18/2016  . Neuropathy ([place]) 02/18/2016   PHYSICAL THERAPY DISCHARGE SUMMARY  Visits from Start of Care: 5  Current functional level  related to goals / functional outcomes:  See above for current status.     Remaining deficits: Continued LBP that limits function.     Education / Equipment: HEP, extensive posture education Plan: Patient agrees to discharge.  Patient goals were partially met. Patient is being discharged due to being pleased with the current functional level.  ?????        Sigurd Sos, PT 03/31/16 1:07 PM  [facility] [address] [place], [place], [address] Phone: [phone]   Fax:  [phone]  Name: ZINA PITZER MRN: [id number] Date of Birth: 09-10-1940

## 2016-03-31 NOTE — Patient Instructions (Addendum)
Knee to Chest    Lying supine, bend involved knee to chest.  Hold 10-15, repeat 3__ times. Repeat with other leg. Do _2__ times per day.  Copyright  VHI. All rights reserved.   Bridge    Lie back, legs bent. Inhale, pressing hips up. Keeping ribs in, lengthen lower back. Exhale, rolling down along spine from top.  Hold 5 seconds Repeat __10__ times. Do __2__ sessions per day.  [email or web address].exer.us/55   [facility] [address], [place], [place] [address] Phone # [phone] Fax [phone] Copyright  VHI. All rights reserved.

## 2016-05-13 ENCOUNTER — Other Ambulatory Visit: Payer: Self-pay | Admitting: Family Medicine

## 2016-05-13 DIAGNOSIS — Z1231 Encounter for screening mammogram for malignant neoplasm of breast: Secondary | ICD-10-CM

## 2016-05-31 DIAGNOSIS — K219 Gastro-esophageal reflux disease without esophagitis: Secondary | ICD-10-CM | POA: Diagnosis not present

## 2016-05-31 DIAGNOSIS — I10 Essential (primary) hypertension: Secondary | ICD-10-CM | POA: Diagnosis not present

## 2016-05-31 DIAGNOSIS — E7800 Pure hypercholesterolemia, unspecified: Secondary | ICD-10-CM | POA: Diagnosis not present

## 2016-06-16 ENCOUNTER — Other Ambulatory Visit: Payer: Self-pay | Admitting: Family Medicine

## 2016-06-16 ENCOUNTER — Ambulatory Visit
Admission: RE | Admit: 2016-06-16 | Discharge: 2016-06-16 | Disposition: A | Discharge status: Home / Self Care | Payer: PPO | Source: Ambulatory Visit | Attending: Family Medicine | Admitting: Family Medicine

## 2016-06-16 DIAGNOSIS — E7800 Pure hypercholesterolemia, unspecified: Secondary | ICD-10-CM | POA: Diagnosis not present

## 2016-06-16 DIAGNOSIS — R1030 Lower abdominal pain, unspecified: Secondary | ICD-10-CM | POA: Diagnosis not present

## 2016-06-16 DIAGNOSIS — R2689 Other abnormalities of gait and mobility: Secondary | ICD-10-CM | POA: Diagnosis not present

## 2016-06-16 DIAGNOSIS — S3992XA Unspecified injury of lower back, initial encounter: Secondary | ICD-10-CM | POA: Diagnosis not present

## 2016-06-16 DIAGNOSIS — S3991XA Unspecified injury of abdomen, initial encounter: Secondary | ICD-10-CM | POA: Diagnosis not present

## 2016-06-16 DIAGNOSIS — M542 Cervicalgia: Secondary | ICD-10-CM | POA: Diagnosis not present

## 2016-06-16 DIAGNOSIS — M5489 Other dorsalgia: Secondary | ICD-10-CM

## 2016-06-16 DIAGNOSIS — G8929 Other chronic pain: Secondary | ICD-10-CM | POA: Diagnosis not present

## 2016-06-16 DIAGNOSIS — M545 Low back pain: Secondary | ICD-10-CM | POA: Diagnosis not present

## 2016-06-28 ENCOUNTER — Ambulatory Visit: Payer: PPO

## 2016-06-28 ENCOUNTER — Other Ambulatory Visit: Payer: PPO

## 2016-06-30 ENCOUNTER — Other Ambulatory Visit: Payer: Self-pay | Admitting: Family Medicine

## 2016-06-30 DIAGNOSIS — W57XXXA Bitten or stung by nonvenomous insect and other nonvenomous arthropods, initial encounter: Secondary | ICD-10-CM | POA: Diagnosis not present

## 2016-06-30 DIAGNOSIS — M542 Cervicalgia: Secondary | ICD-10-CM | POA: Diagnosis not present

## 2016-06-30 DIAGNOSIS — G44319 Acute post-traumatic headache, not intractable: Secondary | ICD-10-CM | POA: Diagnosis not present

## 2016-06-30 DIAGNOSIS — S2096XA Insect bite (nonvenomous) of unspecified parts of thorax, initial encounter: Secondary | ICD-10-CM | POA: Diagnosis not present

## 2016-07-04 ENCOUNTER — Ambulatory Visit
Admission: RE | Admit: 2016-07-04 | Discharge: 2016-07-04 | Disposition: A | Discharge status: Home / Self Care | Payer: PPO | Source: Ambulatory Visit | Attending: Family Medicine | Admitting: Family Medicine

## 2016-07-04 DIAGNOSIS — G44319 Acute post-traumatic headache, not intractable: Secondary | ICD-10-CM

## 2016-07-04 DIAGNOSIS — R51 Headache: Secondary | ICD-10-CM | POA: Diagnosis not present

## 2016-07-04 DIAGNOSIS — S0990XA Unspecified injury of head, initial encounter: Secondary | ICD-10-CM | POA: Diagnosis not present

## 2016-07-05 ENCOUNTER — Other Ambulatory Visit: Payer: PPO

## 2016-07-07 ENCOUNTER — Ambulatory Visit
Admission: RE | Admit: 2016-07-07 | Discharge: 2016-07-07 | Disposition: A | Discharge status: Home / Self Care | Payer: PPO | Source: Ambulatory Visit | Attending: Family Medicine | Admitting: Family Medicine

## 2016-07-07 DIAGNOSIS — M8588 Other specified disorders of bone density and structure, other site: Secondary | ICD-10-CM

## 2016-07-07 DIAGNOSIS — Z780 Asymptomatic menopausal state: Secondary | ICD-10-CM | POA: Diagnosis not present

## 2016-07-07 DIAGNOSIS — M85852 Other specified disorders of bone density and structure, left thigh: Secondary | ICD-10-CM | POA: Diagnosis not present

## 2016-07-07 DIAGNOSIS — Z1231 Encounter for screening mammogram for malignant neoplasm of breast: Secondary | ICD-10-CM | POA: Diagnosis not present

## 2016-07-08 ENCOUNTER — Other Ambulatory Visit: Payer: Self-pay | Admitting: Family Medicine

## 2016-07-08 DIAGNOSIS — R928 Other abnormal and inconclusive findings on diagnostic imaging of breast: Secondary | ICD-10-CM

## 2016-07-12 ENCOUNTER — Other Ambulatory Visit: Payer: PPO

## 2016-07-12 ENCOUNTER — Inpatient Hospital Stay
Admission: RE | Admit: 2016-07-12 | Discharge: 2016-07-12 | Disposition: A | Discharge status: Home / Self Care | Payer: PPO | Source: Ambulatory Visit | Attending: Family Medicine | Admitting: Family Medicine

## 2016-07-20 ENCOUNTER — Other Ambulatory Visit: Payer: Self-pay | Admitting: Family Medicine

## 2016-07-20 ENCOUNTER — Ambulatory Visit
Admission: RE | Admit: 2016-07-20 | Discharge: 2016-07-20 | Disposition: A | Discharge status: Home / Self Care | Payer: PPO | Source: Ambulatory Visit | Attending: Family Medicine | Admitting: Family Medicine

## 2016-07-20 DIAGNOSIS — N6320 Unspecified lump in the left breast, unspecified quadrant: Secondary | ICD-10-CM

## 2016-07-20 DIAGNOSIS — N6323 Unspecified lump in the left breast, lower outer quadrant: Secondary | ICD-10-CM | POA: Diagnosis not present

## 2016-07-20 DIAGNOSIS — R928 Other abnormal and inconclusive findings on diagnostic imaging of breast: Secondary | ICD-10-CM

## 2016-07-20 DIAGNOSIS — N6321 Unspecified lump in the left breast, upper outer quadrant: Secondary | ICD-10-CM | POA: Diagnosis not present

## 2016-07-20 HISTORY — DX: Personal history of irradiation: Z92.3

## 2016-07-26 ENCOUNTER — Other Ambulatory Visit: Payer: Self-pay | Admitting: Family Medicine

## 2016-07-26 ENCOUNTER — Ambulatory Visit
Admission: RE | Admit: 2016-07-26 | Discharge: 2016-07-26 | Disposition: A | Discharge status: Home / Self Care | Payer: PPO | Source: Ambulatory Visit | Attending: Family Medicine | Admitting: Family Medicine

## 2016-07-26 DIAGNOSIS — N649 Disorder of breast, unspecified: Secondary | ICD-10-CM | POA: Diagnosis not present

## 2016-07-26 DIAGNOSIS — N6320 Unspecified lump in the left breast, unspecified quadrant: Secondary | ICD-10-CM

## 2016-07-26 DIAGNOSIS — N6321 Unspecified lump in the left breast, upper outer quadrant: Secondary | ICD-10-CM | POA: Diagnosis not present

## 2016-09-06 DIAGNOSIS — I10 Essential (primary) hypertension: Secondary | ICD-10-CM | POA: Diagnosis not present

## 2016-09-06 DIAGNOSIS — M545 Low back pain: Secondary | ICD-10-CM | POA: Diagnosis not present

## 2016-09-06 DIAGNOSIS — K219 Gastro-esophageal reflux disease without esophagitis: Secondary | ICD-10-CM | POA: Diagnosis not present

## 2016-09-06 DIAGNOSIS — E7800 Pure hypercholesterolemia, unspecified: Secondary | ICD-10-CM | POA: Diagnosis not present

## 2016-09-08 ENCOUNTER — Other Ambulatory Visit: Payer: Self-pay | Admitting: General Surgery

## 2016-09-08 DIAGNOSIS — N6320 Unspecified lump in the left breast, unspecified quadrant: Secondary | ICD-10-CM | POA: Diagnosis not present

## 2016-09-20 ENCOUNTER — Encounter (HOSPITAL_BASED_OUTPATIENT_CLINIC_OR_DEPARTMENT_OTHER): Payer: Self-pay | Admitting: *Deleted

## 2016-09-21 ENCOUNTER — Encounter (HOSPITAL_BASED_OUTPATIENT_CLINIC_OR_DEPARTMENT_OTHER)
Admission: RE | Admit: 2016-09-21 | Discharge: 2016-09-21 | Disposition: A | Discharge status: Home / Self Care | Payer: PPO | Source: Ambulatory Visit | Attending: General Surgery | Admitting: General Surgery

## 2016-09-21 DIAGNOSIS — Z853 Personal history of malignant neoplasm of breast: Secondary | ICD-10-CM | POA: Diagnosis not present

## 2016-09-21 DIAGNOSIS — Z881 Allergy status to other antibiotic agents status: Secondary | ICD-10-CM | POA: Diagnosis not present

## 2016-09-21 DIAGNOSIS — Z888 Allergy status to other drugs, medicaments and biological substances status: Secondary | ICD-10-CM | POA: Diagnosis not present

## 2016-09-21 DIAGNOSIS — Z01818 Encounter for other preprocedural examination: Secondary | ICD-10-CM | POA: Diagnosis not present

## 2016-09-21 DIAGNOSIS — K589 Irritable bowel syndrome without diarrhea: Secondary | ICD-10-CM | POA: Diagnosis not present

## 2016-09-21 DIAGNOSIS — G4700 Insomnia, unspecified: Secondary | ICD-10-CM | POA: Diagnosis not present

## 2016-09-21 DIAGNOSIS — F419 Anxiety disorder, unspecified: Secondary | ICD-10-CM | POA: Diagnosis not present

## 2016-09-21 DIAGNOSIS — Z923 Personal history of irradiation: Secondary | ICD-10-CM | POA: Diagnosis not present

## 2016-09-21 DIAGNOSIS — E785 Hyperlipidemia, unspecified: Secondary | ICD-10-CM | POA: Diagnosis not present

## 2016-09-21 DIAGNOSIS — I10 Essential (primary) hypertension: Secondary | ICD-10-CM | POA: Diagnosis not present

## 2016-09-21 DIAGNOSIS — Z8249 Family history of ischemic heart disease and other diseases of the circulatory system: Secondary | ICD-10-CM | POA: Diagnosis not present

## 2016-09-21 DIAGNOSIS — M8580 Other specified disorders of bone density and structure, unspecified site: Secondary | ICD-10-CM | POA: Diagnosis not present

## 2016-09-21 DIAGNOSIS — Z87442 Personal history of urinary calculi: Secondary | ICD-10-CM | POA: Diagnosis not present

## 2016-09-21 DIAGNOSIS — Z880 Allergy status to penicillin: Secondary | ICD-10-CM | POA: Diagnosis not present

## 2016-09-21 DIAGNOSIS — Z803 Family history of malignant neoplasm of breast: Secondary | ICD-10-CM | POA: Diagnosis not present

## 2016-09-21 DIAGNOSIS — D242 Benign neoplasm of left breast: Secondary | ICD-10-CM | POA: Diagnosis not present

## 2016-09-21 DIAGNOSIS — I4891 Unspecified atrial fibrillation: Secondary | ICD-10-CM | POA: Diagnosis not present

## 2016-09-21 DIAGNOSIS — Z885 Allergy status to narcotic agent status: Secondary | ICD-10-CM | POA: Diagnosis not present

## 2016-09-21 DIAGNOSIS — N6320 Unspecified lump in the left breast, unspecified quadrant: Secondary | ICD-10-CM | POA: Diagnosis present

## 2016-09-21 NOTE — Progress Notes (Signed)
EKG reviewed by Dr. Roanna Banning and Dr. Loralie Champagne, will proceed with surgery as scheduled.  Ensure Pre-surgery drink given with instructions to complete by [facility], pt verbalized understanding.

## 2016-09-22 ENCOUNTER — Other Ambulatory Visit: Payer: Self-pay | Admitting: General Surgery

## 2016-09-22 ENCOUNTER — Ambulatory Visit
Admission: RE | Admit: 2016-09-22 | Discharge: 2016-09-22 | Disposition: A | Discharge status: Home / Self Care | Payer: PPO | Source: Ambulatory Visit | Attending: General Surgery | Admitting: General Surgery

## 2016-09-22 DIAGNOSIS — N6320 Unspecified lump in the left breast, unspecified quadrant: Secondary | ICD-10-CM

## 2016-09-22 DIAGNOSIS — D242 Benign neoplasm of left breast: Secondary | ICD-10-CM | POA: Diagnosis not present

## 2016-09-26 ENCOUNTER — Ambulatory Visit (HOSPITAL_BASED_OUTPATIENT_CLINIC_OR_DEPARTMENT_OTHER): Payer: PPO | Admitting: Anesthesiology

## 2016-09-26 ENCOUNTER — Encounter (HOSPITAL_BASED_OUTPATIENT_CLINIC_OR_DEPARTMENT_OTHER): Payer: Self-pay | Admitting: Certified Registered"

## 2016-09-26 ENCOUNTER — Ambulatory Visit (HOSPITAL_BASED_OUTPATIENT_CLINIC_OR_DEPARTMENT_OTHER)
Admission: RE | Admit: 2016-09-26 | Discharge: 2016-09-26 | Disposition: A | Discharge status: Home / Self Care | Payer: PPO | Source: Ambulatory Visit | Attending: General Surgery | Admitting: General Surgery

## 2016-09-26 ENCOUNTER — Encounter (HOSPITAL_BASED_OUTPATIENT_CLINIC_OR_DEPARTMENT_OTHER)
Admission: RE | Disposition: A | Discharge status: Home / Self Care | Payer: Self-pay | Source: Ambulatory Visit | Attending: General Surgery

## 2016-09-26 ENCOUNTER — Ambulatory Visit
Admission: RE | Admit: 2016-09-26 | Discharge: 2016-09-26 | Disposition: A | Discharge status: Home / Self Care | Payer: PPO | Source: Ambulatory Visit | Attending: General Surgery | Admitting: General Surgery

## 2016-09-26 DIAGNOSIS — I4891 Unspecified atrial fibrillation: Secondary | ICD-10-CM | POA: Insufficient documentation

## 2016-09-26 DIAGNOSIS — N6320 Unspecified lump in the left breast, unspecified quadrant: Secondary | ICD-10-CM

## 2016-09-26 DIAGNOSIS — K589 Irritable bowel syndrome without diarrhea: Secondary | ICD-10-CM | POA: Insufficient documentation

## 2016-09-26 DIAGNOSIS — R928 Other abnormal and inconclusive findings on diagnostic imaging of breast: Secondary | ICD-10-CM | POA: Diagnosis not present

## 2016-09-26 DIAGNOSIS — D242 Benign neoplasm of left breast: Secondary | ICD-10-CM | POA: Diagnosis not present

## 2016-09-26 DIAGNOSIS — Z885 Allergy status to narcotic agent status: Secondary | ICD-10-CM | POA: Insufficient documentation

## 2016-09-26 DIAGNOSIS — Z888 Allergy status to other drugs, medicaments and biological substances status: Secondary | ICD-10-CM | POA: Insufficient documentation

## 2016-09-26 DIAGNOSIS — M8580 Other specified disorders of bone density and structure, unspecified site: Secondary | ICD-10-CM | POA: Diagnosis not present

## 2016-09-26 DIAGNOSIS — F419 Anxiety disorder, unspecified: Secondary | ICD-10-CM | POA: Diagnosis not present

## 2016-09-26 DIAGNOSIS — G4700 Insomnia, unspecified: Secondary | ICD-10-CM | POA: Diagnosis not present

## 2016-09-26 DIAGNOSIS — Z8249 Family history of ischemic heart disease and other diseases of the circulatory system: Secondary | ICD-10-CM | POA: Insufficient documentation

## 2016-09-26 DIAGNOSIS — E785 Hyperlipidemia, unspecified: Secondary | ICD-10-CM | POA: Diagnosis not present

## 2016-09-26 DIAGNOSIS — I10 Essential (primary) hypertension: Secondary | ICD-10-CM | POA: Insufficient documentation

## 2016-09-26 DIAGNOSIS — Z87442 Personal history of urinary calculi: Secondary | ICD-10-CM | POA: Insufficient documentation

## 2016-09-26 DIAGNOSIS — Z923 Personal history of irradiation: Secondary | ICD-10-CM | POA: Diagnosis not present

## 2016-09-26 DIAGNOSIS — Z880 Allergy status to penicillin: Secondary | ICD-10-CM | POA: Insufficient documentation

## 2016-09-26 DIAGNOSIS — Z803 Family history of malignant neoplasm of breast: Secondary | ICD-10-CM | POA: Diagnosis not present

## 2016-09-26 DIAGNOSIS — Z881 Allergy status to other antibiotic agents status: Secondary | ICD-10-CM | POA: Insufficient documentation

## 2016-09-26 DIAGNOSIS — Z853 Personal history of malignant neoplasm of breast: Secondary | ICD-10-CM | POA: Diagnosis not present

## 2016-09-26 HISTORY — PX: RADIOACTIVE SEED GUIDED EXCISIONAL BREAST BIOPSY: SHX6490

## 2016-09-26 SURGERY — RADIOACTIVE SEED GUIDED BREAST BIOPSY
Anesthesia: General | Site: Breast | Laterality: Left

## 2016-09-26 MED ORDER — FENTANYL CITRATE (PF) 100 MCG/2ML IJ SOLN
50.0000 ug | INTRAMUSCULAR | Status: DC | PRN
  Administered 2016-09-26: 50 ug via INTRAVENOUS

## 2016-09-26 MED ORDER — LIDOCAINE 2% (20 MG/ML) 5 ML SYRINGE
INTRAMUSCULAR | Status: DC | PRN
  Administered 2016-09-26: 100 mg via INTRAVENOUS

## 2016-09-26 MED ORDER — GABAPENTIN 300 MG PO CAPS
300.0000 mg | ORAL_CAPSULE | ORAL | Status: AC
  Administered 2016-09-26: 300 mg via ORAL

## 2016-09-26 MED ORDER — GABAPENTIN 300 MG PO CAPS
ORAL_CAPSULE | ORAL | Status: AC
  Filled 2016-09-26: qty 1

## 2016-09-26 MED ORDER — MIDAZOLAM HCL 2 MG/2ML IJ SOLN: 1.0000 mg | INTRAMUSCULAR | Status: DC | PRN

## 2016-09-26 MED ORDER — PROPOFOL 10 MG/ML IV BOLUS
INTRAVENOUS | Status: DC | PRN
  Administered 2016-09-26: 150 mg via INTRAVENOUS

## 2016-09-26 MED ORDER — CEFAZOLIN SODIUM-DEXTROSE 2-4 GM/100ML-% IV SOLN
INTRAVENOUS | Status: AC
  Filled 2016-09-26: qty 100

## 2016-09-26 MED ORDER — FENTANYL CITRATE (PF) 100 MCG/2ML IJ SOLN
INTRAMUSCULAR | Status: AC
  Filled 2016-09-26: qty 2

## 2016-09-26 MED ORDER — DEXAMETHASONE SODIUM PHOSPHATE 10 MG/ML IJ SOLN
INTRAMUSCULAR | Status: AC
  Filled 2016-09-26: qty 1

## 2016-09-26 MED ORDER — LACTATED RINGERS IV SOLN
INTRAVENOUS | Status: DC
  Administered 2016-09-26: 12:00:00 via INTRAVENOUS

## 2016-09-26 MED ORDER — GLYCOPYRROLATE 0.2 MG/ML IV SOSY
PREFILLED_SYRINGE | INTRAVENOUS | Status: DC | PRN
  Administered 2016-09-26: .2 mg via INTRAVENOUS

## 2016-09-26 MED ORDER — ONDANSETRON HCL 4 MG/2ML IJ SOLN
INTRAMUSCULAR | Status: DC | PRN
  Administered 2016-09-26: 4 mg via INTRAVENOUS

## 2016-09-26 MED ORDER — BUPIVACAINE HCL (PF) 0.5 % IJ SOLN
INTRAMUSCULAR | Status: AC
  Filled 2016-09-26: qty 30

## 2016-09-26 MED ORDER — LIDOCAINE 2% (20 MG/ML) 5 ML SYRINGE
INTRAMUSCULAR | Status: AC
  Filled 2016-09-26: qty 5

## 2016-09-26 MED ORDER — ACETAMINOPHEN 500 MG PO TABS
ORAL_TABLET | ORAL | Status: AC
  Filled 2016-09-26: qty 2

## 2016-09-26 MED ORDER — DEXAMETHASONE SODIUM PHOSPHATE 4 MG/ML IJ SOLN
INTRAMUSCULAR | Status: DC | PRN
  Administered 2016-09-26: 10 mg via INTRAVENOUS

## 2016-09-26 MED ORDER — SCOPOLAMINE 1 MG/3DAYS TD PT72
MEDICATED_PATCH | TRANSDERMAL | Status: AC
  Filled 2016-09-26: qty 1

## 2016-09-26 MED ORDER — SCOPOLAMINE 1 MG/3DAYS TD PT72
1.0000 | MEDICATED_PATCH | Freq: Once | TRANSDERMAL | Status: DC | PRN
  Administered 2016-09-26: 1.5 mg via TRANSDERMAL

## 2016-09-26 MED ORDER — CEFAZOLIN SODIUM-DEXTROSE 2-4 GM/100ML-% IV SOLN: 2.0000 g | INTRAVENOUS | Status: DC

## 2016-09-26 MED ORDER — FENTANYL CITRATE (PF) 100 MCG/2ML IJ SOLN
25.0000 ug | INTRAMUSCULAR | Status: DC | PRN
  Administered 2016-09-26: 25 ug via INTRAVENOUS

## 2016-09-26 MED ORDER — BUPIVACAINE HCL (PF) 0.25 % IJ SOLN
INTRAMUSCULAR | Status: DC | PRN
  Administered 2016-09-26: 9 mL

## 2016-09-26 MED ORDER — ACETAMINOPHEN 500 MG PO TABS
1000.0000 mg | ORAL_TABLET | ORAL | Status: AC
  Administered 2016-09-26: 1000 mg via ORAL

## 2016-09-26 MED ORDER — ONDANSETRON HCL 4 MG/2ML IJ SOLN
INTRAMUSCULAR | Status: AC
  Filled 2016-09-26: qty 2

## 2016-09-26 MED ORDER — PROMETHAZINE HCL 25 MG/ML IJ SOLN: 6.2500 mg | INTRAMUSCULAR | Status: DC | PRN

## 2016-09-26 SURGICAL SUPPLY — 59 items
APPLIER CLIP 9.375 MED OPEN (MISCELLANEOUS)
BINDER BREAST LRG (GAUZE/BANDAGES/DRESSINGS) ×3 IMPLANT
BINDER BREAST MEDIUM (GAUZE/BANDAGES/DRESSINGS) IMPLANT
BINDER BREAST XLRG (GAUZE/BANDAGES/DRESSINGS) IMPLANT
BINDER BREAST XXLRG (GAUZE/BANDAGES/DRESSINGS) IMPLANT
BLADE SURG 15 STRL LF DISP TIS (BLADE) ×1 IMPLANT
BLADE SURG 15 STRL SS (BLADE) ×2
CANISTER SUC SOCK COL 7IN (MISCELLANEOUS) IMPLANT
CANISTER SUCT 1200ML W/VALVE (MISCELLANEOUS) IMPLANT
CHLORAPREP W/TINT 26ML (MISCELLANEOUS) ×3 IMPLANT
CLIP APPLIE 9.375 MED OPEN (MISCELLANEOUS) IMPLANT
CLIP VESOCCLUDE SM WIDE 6/CT (CLIP) ×3 IMPLANT
CLOSURE WOUND 1/2 X4 (GAUZE/BANDAGES/DRESSINGS) ×1
COVER BACK TABLE 60X90IN (DRAPES) ×3 IMPLANT
COVER MAYO STAND STRL (DRAPES) ×3 IMPLANT
COVER PROBE W GEL 5X96 (DRAPES) IMPLANT
DECANTER SPIKE VIAL GLASS SM (MISCELLANEOUS) IMPLANT
DERMABOND ADVANCED (GAUZE/BANDAGES/DRESSINGS) ×2
DERMABOND ADVANCED .7 DNX12 (GAUZE/BANDAGES/DRESSINGS) ×1 IMPLANT
DEVICE DUBIN W/COMP PLATE 8390 (MISCELLANEOUS) ×3 IMPLANT
DRAPE LAPAROSCOPIC ABDOMINAL (DRAPES) ×3 IMPLANT
DRAPE UTILITY XL STRL (DRAPES) ×3 IMPLANT
DRSG TEGADERM 4X4.75 (GAUZE/BANDAGES/DRESSINGS) IMPLANT
ELECT COATED BLADE 2.86 ST (ELECTRODE) ×3 IMPLANT
ELECT REM PT RETURN 9FT ADLT (ELECTROSURGICAL) ×3
ELECTRODE REM PT RTRN 9FT ADLT (ELECTROSURGICAL) ×1 IMPLANT
GAUZE SPONGE 4X4 12PLY STRL LF (GAUZE/BANDAGES/DRESSINGS) IMPLANT
GLOVE BIO SURGEON STRL SZ7 (GLOVE) ×6 IMPLANT
GLOVE BIOGEL PI IND STRL 7.0 (GLOVE) ×1 IMPLANT
GLOVE BIOGEL PI IND STRL 7.5 (GLOVE) ×2 IMPLANT
GLOVE BIOGEL PI INDICATOR 7.0 (GLOVE) ×2
GLOVE BIOGEL PI INDICATOR 7.5 (GLOVE) ×4
GLOVE SURG SS PI 6.5 STRL IVOR (GLOVE) ×3 IMPLANT
GOWN STRL REUS W/ TWL LRG LVL3 (GOWN DISPOSABLE) ×2 IMPLANT
GOWN STRL REUS W/TWL LRG LVL3 (GOWN DISPOSABLE) ×4
HEMOSTAT ARISTA ABSORB 3G PWDR (MISCELLANEOUS) ×3 IMPLANT
ILLUMINATOR WAVEGUIDE N/F (MISCELLANEOUS) IMPLANT
KIT MARKER MARGIN INK (KITS) ×3 IMPLANT
LIGHT WAVEGUIDE WIDE FLAT (MISCELLANEOUS) IMPLANT
NEEDLE HYPO 25X1 1.5 SAFETY (NEEDLE) ×3 IMPLANT
NS IRRIG 1000ML POUR BTL (IV SOLUTION) IMPLANT
PACK BASIN DAY SURGERY FS (CUSTOM PROCEDURE TRAY) ×3 IMPLANT
PENCIL BUTTON HOLSTER BLD 10FT (ELECTRODE) ×3 IMPLANT
SLEEVE SCD COMPRESS KNEE MED (MISCELLANEOUS) ×3 IMPLANT
SPONGE LAP 4X18 X RAY DECT (DISPOSABLE) ×3 IMPLANT
STRIP CLOSURE SKIN 1/2X4 (GAUZE/BANDAGES/DRESSINGS) ×2 IMPLANT
SUT MNCRL AB 4-0 PS2 18 (SUTURE) ×3 IMPLANT
SUT MON AB 5-0 PS2 18 (SUTURE) IMPLANT
SUT SILK 2 0 SH (SUTURE) IMPLANT
SUT VIC AB 2-0 SH 27 (SUTURE) ×2
SUT VIC AB 2-0 SH 27XBRD (SUTURE) ×1 IMPLANT
SUT VIC AB 3-0 SH 27 (SUTURE) ×2
SUT VIC AB 3-0 SH 27X BRD (SUTURE) ×1 IMPLANT
SYR CONTROL 10ML LL (SYRINGE) ×3 IMPLANT
TOWEL OR 17X24 6PK STRL BLUE (TOWEL DISPOSABLE) ×3 IMPLANT
TOWEL OR NON WOVEN STRL DISP B (DISPOSABLE) ×3 IMPLANT
TUBE CONNECTING 20'X1/4 (TUBING)
TUBE CONNECTING 20X1/4 (TUBING) IMPLANT
YANKAUER SUCT BULB TIP NO VENT (SUCTIONS) IMPLANT

## 2016-09-26 NOTE — Interval H&P Note (Signed)
History and Physical Interval Note:  09/26/2016 12:35 PM  Alexandra Gonzales  has presented today for surgery, with the diagnosis of Left breast mass  The various methods of treatment have been discussed with the patient and family. After consideration of risks, benefits and other options for treatment, the patient has consented to  Procedure(s) with comments: LEFT RADIOACTIVE SEED GUIDED EXCISIONAL BREAST BIOPSY ERAS PATHWAY (Left) - LMA as a surgical intervention .  The patient's history has been reviewed, patient examined, no change in status, stable for surgery.  I have reviewed the patient's chart and labs.  Questions were answered to the patient's satisfaction.     Svetlana Bagby

## 2016-09-26 NOTE — H&P (Signed)
Alexandra Gonzales is an 76 y.o. female.   Chief Complaint: left breast mass HPI:  65 yof referred by Alexandra Gonzales for new left breast lesion. she has history in 2004 of right breast cancer that was treated with lump/sn followed by radiotherapy and five years of antiestrogen. she underwent routine mm this year with finding of a 4 mm indeterminate mass in 2 oclock position of the left breast. multiple axillary nodes with mild cortical thickening present. this underwent core biopsy with finding of fragment of papillary lesion with cytologic atypia with low grade cellular monotony. she is here with her daughter to discuss options   Past Medical History:  Diagnosis Date  . Allergy   . Anxiety   . Cancer ([place])    questionable cancer to nipple  . Hyperlipidemia   . Hypertension   . IBS (irritable bowel syndrome)   . Insomnia   . Kidney stones   . Osteopenia   . Personal history of radiation therapy     Past Surgical History:  Procedure Laterality Date  . ABDOMINAL HYSTERECTOMY    . BREAST LUMPECTOMY Left    march 2000  . BREAST SURGERY     R lumpectomy 2000  . EXPLORATORY LAPAROTOMY    . EYE SURGERY     bilat cataract removal  . MOUTH SURGERY      Family History  Problem Relation Age of Onset  . Congestive Heart Failure Mother        Died 74  . Kidney Stones Father        Died 48 - complications from kidney stone  . Breast cancer Sister 6   Social History:  reports that she has never smoked. She has never used smokeless tobacco. She reports that she does not drink alcohol or use drugs.  Allergies:  Allergies  Allergen Reactions  . Morphine And Related Other (See Comments)    hallucinations  . Codeine Nausea And Vomiting  . Dexamethasone Nausea Only  . Erythromycin Rash  . Hydrocodone Nausea Only  . Lyrica [Pregabalin] Nausea And Vomiting  . Prevacid [Lansoprazole] Photosensitivity  . Polytrim [Polymyxin B-Trimethoprim] Other (See Comments)    Sores in mouth, decreases memory   . Penicillins Rash    Medications Prior to Admission  Medication Sig Dispense Refill  . enalapril (VASOTEC) 20 MG tablet Take 20 mg by mouth daily.    . pantoprazole (PROTONIX) 40 MG tablet Take 40 mg by mouth daily.    . Probiotic Product (PROBIOTIC-10) CAPS Take by mouth.    Marland Kitchen tiZANidine (ZANAFLEX) 2 MG tablet as needed.      No results found for this or any previous visit (from the past 48 hour(s)). No results found.  ROS Negative  Blood pressure (!) 162/59, Gonzales (!) 57, temperature 98.2 F (36.8 C), temperature source Oral, resp. rate 18, height 5\' 2"  (1.575 m), weight 67.6 kg (149 lb), SpO2 100 %. Physical Exam  Vitals (Alexandra Gonzales RMA; 09/08/2016 9:01 AM) 09/08/2016 9:01 AM Weight: 148.4 lb Height: 62in Body Surface Area: 1.68 m Body Mass Index: 27.14 kg/m  Temp.: 97.7F  Gonzales: 58 (Regular)  BP: 136/82 (Sitting, Left Arm, Standard) Physical Exam Alexandra Bookbinder MD; 09/08/2016 9:31 AM) General Mental Status-Alert. Orientation-Oriented X3. Head and Neck Trachea-midline. Thyroid Gland Characteristics - normal size and consistency. Eye Sclera/Conjunctiva - Bilateral-No scleral icterus. Chest and Lung Exam Chest and lung exam reveals -quiet, even and easy respiratory effort with no use of accessory muscles and on auscultation, normal  breath sounds, no adventitious sounds and normal vocal resonance. Breast Nipples Discharge - Left - None. Right - Note: surgically absent. Breast Lump-No Palpable Breast Mass. Cardiovascular Cardiovascular examination reveals -normal heart sounds, regular rate and rhythm with no murmurs. Abdomen Note: soft nt/nd Lymphatic Head & Neck General Head & Neck Lymphatics: Bilateral - Description - Normal. Axillary General Axillary Region: Bilateral - Description - Normal. Note: no Alexandra Gonzales adenopathy   Assessment & Plan Alexandra Bookbinder MD; 09/08/2016 9:35 AM) MASS OF LEFT BREAST ON MAMMOGRAM  (N63.20) Story: left breast seed guided excisional biopsy with her history and atypia I think this should be excised. I discussed seed guided excision of this area. we discussed procedure, risks and recovery as well as options afterwards. will proceed soon    Alexandra Bookbinder, MD 09/26/2016, 12:33 PM

## 2016-09-26 NOTE — Anesthesia Postprocedure Evaluation (Signed)
Anesthesia Post Note  Patient: Alexandra Gonzales  Procedure(s) Performed: Procedure(s) (LRB): LEFT RADIOACTIVE SEED GUIDED EXCISIONAL BREAST BIOPSY ERAS PATHWAY (Left)     Patient location during evaluation: PACU Anesthesia Type: General Level of consciousness: sedated Pain management: pain level controlled Vital Signs Assessment: post-procedure vital signs reviewed and stable Respiratory status: spontaneous breathing and respiratory function stable Cardiovascular status: stable Postop Assessment: no apparent nausea or vomiting Anesthetic complications: no    Last Vitals:  Vitals:   09/26/16 1415 09/26/16 1430  BP: (!) 158/65 (!) 161/63  Pulse: (!) 59 60  Resp: 17 16  Temp:  36.4 C  SpO2: 100% 97%    Last Pain:  Vitals:   09/26/16 1445  TempSrc:   PainSc: 2                  Tyshawna Alarid DANIEL

## 2016-09-26 NOTE — Discharge Instructions (Signed)
[facility] °Office Phone Number [phone] ° °POST OP INSTRUCTIONS ° °Always review your discharge instruction sheet given to you by the facility where your surgery was performed. ° °IF YOU HAVE DISABILITY OR FAMILY LEAVE FORMS, YOU MUST BRING THEM TO THE OFFICE FOR PROCESSING.  DO NOT GIVE THEM TO YOUR DOCTOR. ° °1. A prescription for pain medication may be given to you upon discharge.  Take your pain medication as prescribed, if needed.  If narcotic pain medicine is not needed, then you may take acetaminophen (Tylenol), naprosyn (Alleve) or ibuprofen (Advil) as needed. °2. Take your usually prescribed medications unless otherwise directed °3. If you need a refill on your pain medication, please contact your pharmacy.  They will contact our office to request authorization.  Prescriptions will not be filled after 5pm or on week-ends. °4. You should eat very light the first 24 hours after surgery, such as soup, crackers, pudding, etc.  Resume your normal diet the day after surgery. °5. Most patients will experience some swelling and bruising in the breast.  Ice packs and a good support bra will help.  Wear the breast binder provided or a sports bra for 72 hours day and night.  After that wear a sports bra during the day until you return to the office. Swelling and bruising can take several days to resolve.  °6. It is common to experience some constipation if taking pain medication after surgery.  Increasing fluid intake and taking a stool softener will usually help or prevent this problem from occurring.  A mild laxative (Milk of Magnesia or Miralax) should be taken according to package directions if there are no bowel movements after 48 hours. °7. Unless discharge instructions indicate otherwise, you may remove your bandages 48 hours after surgery and you may shower at that time.  You may have steri-strips (small skin tapes) in place directly over the incision.  These strips should be left on the  skin for 7-10 days and will come off on their own.  If your surgeon used skin glue on the incision, you may shower in 24 hours.  The glue will flake off over the next 2-3 weeks.  Any sutures or staples will be removed at the office during your follow-up visit. °8. ACTIVITIES:  You may resume regular daily activities (gradually increasing) beginning the next day.  Wearing a good support bra or sports bra minimizes pain and swelling.  You may have sexual intercourse when it is comfortable. °a. You may drive when you no longer are taking prescription pain medication, you can comfortably wear a seatbelt, and you can safely maneuver your car and apply brakes. °b. RETURN TO WORK:  ______________________________________________________________________________________ °9. You should see your doctor in the office for a follow-up appointment approximately two weeks after your surgery.  Your doctor’s nurse will typically make your follow-up appointment when she calls you with your pathology report.  Expect your pathology report 3-4 business days after your surgery.  You may call to check if you do not hear from us after three days. °10. OTHER INSTRUCTIONS: _______________________________________________________________________________________________ _____________________________________________________________________________________________________________________________________ °_____________________________________________________________________________________________________________________________________ °_____________________________________________________________________________________________________________________________________ ° °WHEN TO CALL DR WAKEFIELD: °1. Fever over 101.0 °2. Nausea and/or vomiting. °3. Extreme swelling or bruising. °4. Continued bleeding from incision. °5. Increased pain, redness, or drainage from the incision. ° °The clinic staff is available to answer your questions during regular  business hours.  Please don’t hesitate to call and ask to speak to one of the nurses for clinical concerns.  If   you have a medical emergency, go to the nearest emergency room or call 911.  A surgeon from [facility] is always on call at the hospital. ° °For further questions, please visit [email or web address] ° ° ° ° ° °Post Anesthesia Home Care Instructions ° °Activity: °Get plenty of rest for the remainder of the day. A responsible individual must stay with you for 24 hours following the procedure.  °For the next 24 hours, DO NOT: °-Drive a car °-Operate machinery °-Drink alcoholic beverages °-Take any medication unless instructed by your physician °-Make any legal decisions or sign important papers. ° °Meals: °Start with liquid foods such as gelatin or soup. Progress to regular foods as tolerated. Avoid greasy, spicy, heavy foods. If nausea and/or vomiting occur, drink only clear liquids until the nausea and/or vomiting subsides. Call your physician if vomiting continues. ° °Special Instructions/Symptoms: °Your throat may feel dry or sore from the anesthesia or the breathing tube placed in your throat during surgery. If this causes discomfort, gargle with warm salt water. The discomfort should disappear within 24 hours. ° °If you had a scopolamine patch placed behind your ear for the management of post- operative nausea and/or vomiting: ° °1. The medication in the patch is effective for 72 hours, after which it should be removed.  Wrap patch in a tissue and discard in the trash. Wash hands thoroughly with soap and water. °2. You may remove the patch earlier than 72 hours if you experience unpleasant side effects which may include dry mouth, dizziness or visual disturbances. °3. Avoid touching the patch. Wash your hands with soap and water after contact with the patch. °  ° °

## 2016-09-26 NOTE — Anesthesia Preprocedure Evaluation (Addendum)
Anesthesia Evaluation  Patient identified by MRN, date of birth, ID band Patient awake    Reviewed: Allergy & Precautions, NPO status , Patient's Chart, lab work & pertinent test results  History of Anesthesia Complications (+) PONVNegative for: history of anesthetic complications  Airway Mallampati: II  TM Distance: >3 FB Neck ROM: Full    Dental  (+) Edentulous Lower, Dental Advisory Given   Pulmonary neg pulmonary ROS,    Pulmonary exam normal        Cardiovascular hypertension, Pt. on medications negative cardio ROS Normal cardiovascular exam     Neuro/Psych Anxiety negative neurological ROS     GI/Hepatic Neg liver ROS, GERD  ,  Endo/Other  negative endocrine ROS  Renal/GU negative Renal ROS     Musculoskeletal negative musculoskeletal ROS (+)   Abdominal   Peds  Hematology negative hematology ROS (+)   Anesthesia Other Findings Day of surgery medications reviewed with the patient.  Reproductive/Obstetrics                            Anesthesia Physical Anesthesia Plan  ASA: III  Anesthesia Plan: General   Post-op Pain Management:    Induction: Intravenous  PONV Risk Score and Plan: 4 or greater and Ondansetron, Dexamethasone, Scopolamine patch - Pre-op and Diphenhydramine  Airway Management Planned: LMA  Additional Equipment:   Intra-op Plan:   Post-operative Plan: Extubation in OR  Informed Consent: I have reviewed the patients History and Physical, chart, labs and discussed the procedure including the risks, benefits and alternatives for the proposed anesthesia with the patient or authorized representative who has indicated his/her understanding and acceptance.   Dental advisory given  Plan Discussed with: CRNA, Anesthesiologist and Surgeon  Anesthesia Plan Comments:        Anesthesia Quick Evaluation

## 2016-09-26 NOTE — Transfer of Care (Signed)
Immediate Anesthesia Transfer of Care Note  Patient: Alexandra Gonzales  Procedure(s) Performed: Procedure(s) with comments: LEFT RADIOACTIVE SEED GUIDED EXCISIONAL BREAST BIOPSY ERAS PATHWAY (Left) - LMA  Patient Location: PACU  Anesthesia Type:General  Level of Consciousness: awake, sedated and responds to stimulation  Airway & Oxygen Therapy: Patient Spontanous Breathing and Patient connected to face mask oxygen  Post-op Assessment: Report given to RN and Post -op Vital signs reviewed and stable  Post vital signs: Reviewed and stable  Last Vitals:  Vitals:   09/26/16 1156  BP: (!) 162/59  Pulse: (!) 57  Resp: 18  Temp: 36.8 C  SpO2: 100%    Last Pain:  Vitals:   09/26/16 1156  TempSrc: Oral      Patients Stated Pain Goal: 3 (58/30/94 0768)  Complications: No apparent anesthesia complications

## 2016-09-26 NOTE — Anesthesia Procedure Notes (Signed)
Procedure Name: LMA Insertion Date/Time: 09/26/2016 12:54 PM Performed by: Lyndee Leo Pre-anesthesia Checklist: Patient identified, Emergency Drugs available, Suction available and Patient being monitored Patient Re-evaluated:Patient Re-evaluated prior to induction Oxygen Delivery Method: Circle system utilized Preoxygenation: Pre-oxygenation with 100% oxygen Induction Type: IV induction Ventilation: Mask ventilation without difficulty LMA: LMA inserted LMA Size: 4.0 Number of attempts: 1 Airway Equipment and Method: Bite block Placement Confirmation: positive ETCO2 Tube secured with: Tape Dental Injury: Teeth and Oropharynx as per pre-operative assessment

## 2016-09-26 NOTE — Op Note (Signed)
Preoperative diagnoses: Left breast mammographic lesion  Postoperative diagnosis: Same as above Procedure: Left breast seed guided excisional biopsy Surgeon: Dr. Serita Grammes Anesthesia: Gen. Estimated blood loss: minimal Complications: None Drains: None Specimens: Left breast tissue marked with paint Sponge and needle count correct at completion Disposition to recovery stable  Indications: This is a 2 yof with prior right breast cancer who underwent mm that showed a left breast lesion that is 4 mm on [place].  Core biopsy is fragment of papillary lesion with cytologic atypia.  She has multiple axillary nodes with mild cortical thickening. We discussed seed guided excision of the left breast mass using radioactive seed guidance as the next step.   Procedure: After informed consent was obtained she was then taken to the operating room. She was given ancef. Sequential compression devices were on her legs. She was placed under general anesthesia without complication. Her chestwas then prepped and draped in the standard sterile surgical fashion. A surgical timeout was then performed.  The seed was in the lateralleft breast. She had small ulceration at needle site from core biopsy. I infiltrated marcaine and made an elliptical incision overlying the lesion to remove the ulcerated area.  I then used the neoprobe to guide excision of the seedand surrounding tissue.  Mammogram confirmed removal of seed and the clip. This was then all sent to pathology. Hemostasis was observed.  I closed the breast tissue with a 2-0 Vicryl. The dermis was closed with 3-0 Vicryl and the skin with 4-0 Monocryl.Dermabond and steristrips were placed on the incision. A breast binder was placed. She was transferred to recovery stable

## 2016-09-27 ENCOUNTER — Encounter (HOSPITAL_BASED_OUTPATIENT_CLINIC_OR_DEPARTMENT_OTHER): Payer: Self-pay | Admitting: General Surgery

## 2016-09-28 ENCOUNTER — Encounter (HOSPITAL_BASED_OUTPATIENT_CLINIC_OR_DEPARTMENT_OTHER): Payer: Self-pay | Admitting: General Surgery

## 2016-10-18 DIAGNOSIS — I10 Essential (primary) hypertension: Secondary | ICD-10-CM | POA: Diagnosis not present

## 2016-10-18 DIAGNOSIS — K219 Gastro-esophageal reflux disease without esophagitis: Secondary | ICD-10-CM | POA: Diagnosis not present

## 2016-10-18 DIAGNOSIS — R109 Unspecified abdominal pain: Secondary | ICD-10-CM | POA: Diagnosis not present

## 2016-10-18 DIAGNOSIS — M545 Low back pain: Secondary | ICD-10-CM | POA: Diagnosis not present

## 2016-10-18 DIAGNOSIS — E7800 Pure hypercholesterolemia, unspecified: Secondary | ICD-10-CM | POA: Diagnosis not present

## 2016-11-11 DIAGNOSIS — I6523 Occlusion and stenosis of bilateral carotid arteries: Secondary | ICD-10-CM | POA: Diagnosis not present

## 2016-11-15 ENCOUNTER — Other Ambulatory Visit: Payer: Self-pay | Admitting: Family Medicine

## 2016-11-15 DIAGNOSIS — I6523 Occlusion and stenosis of bilateral carotid arteries: Secondary | ICD-10-CM

## 2016-11-17 ENCOUNTER — Ambulatory Visit
Admission: RE | Admit: 2016-11-17 | Discharge: 2016-11-17 | Disposition: A | Discharge status: Home / Self Care | Payer: PPO | Source: Ambulatory Visit | Attending: Family Medicine | Admitting: Family Medicine

## 2016-11-17 DIAGNOSIS — I6523 Occlusion and stenosis of bilateral carotid arteries: Secondary | ICD-10-CM | POA: Diagnosis not present

## 2016-12-06 DIAGNOSIS — K589 Irritable bowel syndrome without diarrhea: Secondary | ICD-10-CM | POA: Diagnosis not present

## 2016-12-06 DIAGNOSIS — I10 Essential (primary) hypertension: Secondary | ICD-10-CM | POA: Diagnosis not present

## 2016-12-06 DIAGNOSIS — E7800 Pure hypercholesterolemia, unspecified: Secondary | ICD-10-CM | POA: Diagnosis not present

## 2016-12-06 DIAGNOSIS — M25552 Pain in left hip: Secondary | ICD-10-CM | POA: Diagnosis not present

## 2016-12-06 DIAGNOSIS — I6523 Occlusion and stenosis of bilateral carotid arteries: Secondary | ICD-10-CM | POA: Diagnosis not present

## 2016-12-27 DIAGNOSIS — M7061 Trochanteric bursitis, right hip: Secondary | ICD-10-CM | POA: Diagnosis not present

## 2016-12-27 DIAGNOSIS — M25551 Pain in right hip: Secondary | ICD-10-CM | POA: Diagnosis not present

## 2016-12-27 DIAGNOSIS — M5441 Lumbago with sciatica, right side: Secondary | ICD-10-CM | POA: Diagnosis not present

## 2016-12-27 DIAGNOSIS — G8929 Other chronic pain: Secondary | ICD-10-CM | POA: Diagnosis not present

## 2017-02-01 ENCOUNTER — Other Ambulatory Visit: Payer: Self-pay | Admitting: Family Medicine

## 2017-02-01 DIAGNOSIS — N6320 Unspecified lump in the left breast, unspecified quadrant: Secondary | ICD-10-CM

## 2017-02-09 DIAGNOSIS — D3132 Benign neoplasm of left choroid: Secondary | ICD-10-CM | POA: Diagnosis not present

## 2017-02-09 DIAGNOSIS — H40013 Open angle with borderline findings, low risk, bilateral: Secondary | ICD-10-CM | POA: Diagnosis not present

## 2017-02-09 DIAGNOSIS — Z961 Presence of intraocular lens: Secondary | ICD-10-CM | POA: Diagnosis not present

## 2017-02-14 DIAGNOSIS — J0100 Acute maxillary sinusitis, unspecified: Secondary | ICD-10-CM | POA: Diagnosis not present

## 2017-02-20 DIAGNOSIS — J069 Acute upper respiratory infection, unspecified: Secondary | ICD-10-CM | POA: Diagnosis not present

## 2017-02-20 DIAGNOSIS — K589 Irritable bowel syndrome without diarrhea: Secondary | ICD-10-CM | POA: Diagnosis not present

## 2017-02-20 DIAGNOSIS — J4521 Mild intermittent asthma with (acute) exacerbation: Secondary | ICD-10-CM | POA: Diagnosis not present

## 2017-03-03 DIAGNOSIS — K219 Gastro-esophageal reflux disease without esophagitis: Secondary | ICD-10-CM | POA: Diagnosis not present

## 2017-03-03 DIAGNOSIS — I10 Essential (primary) hypertension: Secondary | ICD-10-CM | POA: Diagnosis not present

## 2017-03-03 DIAGNOSIS — E7800 Pure hypercholesterolemia, unspecified: Secondary | ICD-10-CM | POA: Diagnosis not present

## 2017-03-03 DIAGNOSIS — R05 Cough: Secondary | ICD-10-CM | POA: Diagnosis not present

## 2017-03-07 DIAGNOSIS — Z124 Encounter for screening for malignant neoplasm of cervix: Secondary | ICD-10-CM | POA: Diagnosis not present

## 2017-03-07 DIAGNOSIS — Z6831 Body mass index (BMI) 31.0-31.9, adult: Secondary | ICD-10-CM | POA: Diagnosis not present

## 2017-04-19 DIAGNOSIS — M25551 Pain in right hip: Secondary | ICD-10-CM | POA: Diagnosis not present

## 2017-04-19 DIAGNOSIS — M549 Dorsalgia, unspecified: Secondary | ICD-10-CM | POA: Diagnosis not present

## 2017-05-26 DIAGNOSIS — M1711 Unilateral primary osteoarthritis, right knee: Secondary | ICD-10-CM | POA: Diagnosis not present

## 2017-06-01 DIAGNOSIS — I10 Essential (primary) hypertension: Secondary | ICD-10-CM | POA: Diagnosis not present

## 2017-06-01 DIAGNOSIS — E7800 Pure hypercholesterolemia, unspecified: Secondary | ICD-10-CM | POA: Diagnosis not present

## 2017-06-01 DIAGNOSIS — M5431 Sciatica, right side: Secondary | ICD-10-CM | POA: Diagnosis not present

## 2017-06-01 DIAGNOSIS — K219 Gastro-esophageal reflux disease without esophagitis: Secondary | ICD-10-CM | POA: Diagnosis not present

## 2017-06-20 DIAGNOSIS — K580 Irritable bowel syndrome with diarrhea: Secondary | ICD-10-CM | POA: Diagnosis not present

## 2017-06-20 DIAGNOSIS — K219 Gastro-esophageal reflux disease without esophagitis: Secondary | ICD-10-CM | POA: Diagnosis not present

## 2017-06-20 DIAGNOSIS — Z86010 Personal history of colonic polyps: Secondary | ICD-10-CM | POA: Diagnosis not present

## 2017-06-20 DIAGNOSIS — K6289 Other specified diseases of anus and rectum: Secondary | ICD-10-CM | POA: Diagnosis not present

## 2017-07-03 ENCOUNTER — Inpatient Hospital Stay
Admission: RE | Admit: 2017-07-03 | Discharge: 2017-07-03 | Disposition: A | Discharge status: Home / Self Care | Payer: PPO | Source: Ambulatory Visit | Attending: Family Medicine | Admitting: Family Medicine

## 2017-07-03 ENCOUNTER — Other Ambulatory Visit: Payer: Self-pay | Admitting: Family Medicine

## 2017-07-03 DIAGNOSIS — N6320 Unspecified lump in the left breast, unspecified quadrant: Secondary | ICD-10-CM

## 2017-07-05 ENCOUNTER — Other Ambulatory Visit: Payer: PPO

## 2017-07-07 ENCOUNTER — Ambulatory Visit: Admission: RE | Admit: 2017-07-07 | Payer: PPO | Source: Ambulatory Visit

## 2017-07-07 ENCOUNTER — Ambulatory Visit
Admission: RE | Admit: 2017-07-07 | Discharge: 2017-07-07 | Disposition: A | Discharge status: Home / Self Care | Payer: PPO | Source: Ambulatory Visit | Attending: Family Medicine | Admitting: Family Medicine

## 2017-07-07 DIAGNOSIS — N6320 Unspecified lump in the left breast, unspecified quadrant: Secondary | ICD-10-CM

## 2017-07-11 ENCOUNTER — Ambulatory Visit
Admission: RE | Admit: 2017-07-11 | Discharge: 2017-07-11 | Disposition: A | Discharge status: Home / Self Care | Payer: PPO | Source: Ambulatory Visit | Attending: Family Medicine | Admitting: Family Medicine

## 2017-07-11 ENCOUNTER — Ambulatory Visit: Payer: PPO

## 2017-07-11 DIAGNOSIS — R928 Other abnormal and inconclusive findings on diagnostic imaging of breast: Secondary | ICD-10-CM | POA: Diagnosis not present

## 2017-07-11 DIAGNOSIS — Z853 Personal history of malignant neoplasm of breast: Secondary | ICD-10-CM | POA: Diagnosis not present

## 2017-07-11 HISTORY — DX: Malignant neoplasm of unspecified site of unspecified female breast: C50.919

## 2017-07-20 DIAGNOSIS — R42 Dizziness and giddiness: Secondary | ICD-10-CM | POA: Diagnosis not present

## 2017-07-20 DIAGNOSIS — I10 Essential (primary) hypertension: Secondary | ICD-10-CM | POA: Diagnosis not present

## 2017-07-20 DIAGNOSIS — M545 Low back pain: Secondary | ICD-10-CM | POA: Diagnosis not present

## 2017-07-20 DIAGNOSIS — K219 Gastro-esophageal reflux disease without esophagitis: Secondary | ICD-10-CM | POA: Diagnosis not present

## 2017-08-10 DIAGNOSIS — R1909 Other intra-abdominal and pelvic swelling, mass and lump: Secondary | ICD-10-CM | POA: Diagnosis not present

## 2017-08-31 DIAGNOSIS — E7800 Pure hypercholesterolemia, unspecified: Secondary | ICD-10-CM | POA: Diagnosis not present

## 2017-08-31 DIAGNOSIS — I10 Essential (primary) hypertension: Secondary | ICD-10-CM | POA: Diagnosis not present

## 2017-08-31 DIAGNOSIS — L0292 Furuncle, unspecified: Secondary | ICD-10-CM | POA: Diagnosis not present

## 2017-08-31 DIAGNOSIS — K219 Gastro-esophageal reflux disease without esophagitis: Secondary | ICD-10-CM | POA: Diagnosis not present

## 2017-11-22 DIAGNOSIS — K6289 Other specified diseases of anus and rectum: Secondary | ICD-10-CM | POA: Diagnosis not present

## 2017-11-22 DIAGNOSIS — R109 Unspecified abdominal pain: Secondary | ICD-10-CM | POA: Diagnosis not present

## 2017-11-22 DIAGNOSIS — M25551 Pain in right hip: Secondary | ICD-10-CM | POA: Diagnosis not present

## 2017-12-01 DIAGNOSIS — I10 Essential (primary) hypertension: Secondary | ICD-10-CM | POA: Diagnosis not present

## 2017-12-01 DIAGNOSIS — E7800 Pure hypercholesterolemia, unspecified: Secondary | ICD-10-CM | POA: Diagnosis not present

## 2017-12-01 DIAGNOSIS — K219 Gastro-esophageal reflux disease without esophagitis: Secondary | ICD-10-CM | POA: Diagnosis not present

## 2017-12-01 DIAGNOSIS — I6523 Occlusion and stenosis of bilateral carotid arteries: Secondary | ICD-10-CM | POA: Diagnosis not present

## 2017-12-05 ENCOUNTER — Other Ambulatory Visit: Payer: Self-pay | Admitting: Family Medicine

## 2017-12-05 DIAGNOSIS — I6523 Occlusion and stenosis of bilateral carotid arteries: Secondary | ICD-10-CM

## 2017-12-13 ENCOUNTER — Ambulatory Visit
Admission: RE | Admit: 2017-12-13 | Discharge: 2017-12-13 | Disposition: A | Discharge status: Home / Self Care | Payer: PPO | Source: Ambulatory Visit | Attending: Family Medicine | Admitting: Family Medicine

## 2017-12-13 DIAGNOSIS — I6523 Occlusion and stenosis of bilateral carotid arteries: Secondary | ICD-10-CM | POA: Diagnosis not present

## 2017-12-18 ENCOUNTER — Other Ambulatory Visit: Payer: PPO

## 2018-03-16 ENCOUNTER — Ambulatory Visit
Admission: RE | Admit: 2018-03-16 | Discharge: 2018-03-16 | Disposition: A | Discharge status: Home / Self Care | Payer: Medicare Other | Source: Ambulatory Visit | Attending: Family Medicine | Admitting: Family Medicine

## 2018-03-16 ENCOUNTER — Other Ambulatory Visit: Payer: Self-pay | Admitting: Family Medicine

## 2018-03-16 DIAGNOSIS — R05 Cough: Secondary | ICD-10-CM

## 2018-03-16 DIAGNOSIS — R059 Cough, unspecified: Secondary | ICD-10-CM

## 2018-07-03 ENCOUNTER — Other Ambulatory Visit: Payer: Self-pay

## 2018-07-03 ENCOUNTER — Encounter: Payer: Self-pay | Admitting: Allergy and Immunology

## 2018-07-03 ENCOUNTER — Ambulatory Visit (INDEPENDENT_AMBULATORY_CARE_PROVIDER_SITE_OTHER): Payer: Medicare Other | Admitting: Allergy and Immunology

## 2018-07-03 VITALS — BP 130/60 | HR 71 | Temp 98.7°F | Resp 16 | Ht 61.0 in | Wt 149.8 lb

## 2018-07-03 DIAGNOSIS — J3089 Other allergic rhinitis: Secondary | ICD-10-CM

## 2018-07-03 DIAGNOSIS — K219 Gastro-esophageal reflux disease without esophagitis: Secondary | ICD-10-CM

## 2018-07-03 DIAGNOSIS — Z79899 Other long term (current) drug therapy: Secondary | ICD-10-CM

## 2018-07-03 DIAGNOSIS — L500 Allergic urticaria: Secondary | ICD-10-CM

## 2018-07-03 MED ORDER — MONTELUKAST SODIUM 10 MG PO TABS: 10.0000 mg | ORAL_TABLET | Freq: Every day | ORAL | 5 refills | Status: DC

## 2018-07-03 MED ORDER — CETIRIZINE HCL 10 MG PO TABS: 10.0000 mg | ORAL_TABLET | Freq: Every day | ORAL | 5 refills | Status: DC

## 2018-07-03 MED ORDER — LOSARTAN POTASSIUM 50 MG PO TABS: 50.0000 mg | ORAL_TABLET | Freq: Every day | ORAL | 5 refills | Status: DC

## 2018-07-03 MED ORDER — FAMOTIDINE 40 MG PO TABS: 40.0000 mg | ORAL_TABLET | Freq: Every day | ORAL | 5 refills | Status: DC

## 2018-07-03 NOTE — Patient Instructions (Addendum)
  1.  Allergen avoidance measures?  2.  Treat and prevent immune system overactivity:   A.  Montelukast 10 mg - 1 tablet daily  B.  Cetirizine 10 mg - 1 tablet daily  3.  Treat and prevent reflux:   A.  Eliminate all consumption of caffeine and chocolate  B.  Pantoprazole 40 mg - 1 tablet in a.m.  C.  Famotidine 40 mg - 1 tablet in p.m.  4.  Change enalapril to losartan 50 mg daily.  Check blood pressure  5.  Blood -shellfish IgE panel, alpha gal panel, TSH, T4, TP    6.  Return to clinic in 4 weeks or earlier if problem

## 2018-07-03 NOTE — Progress Notes (Signed)
[facility] [facility] [place] [facility] [place] [facility] [place] -    Dear Dr. Kenton Kingfisher,  Thank you for referring Alexandra Gonzales to the [facility] on 07/03/2018.   Below is a summation of this patient's evaluation and recommendations.  Thank you for your referral. I will keep you informed about this patient's response to treatment.   If you have any questions please do not hesitate to contact me.   Sincerely,  Jiles Prows, MD Allergy / Immunology [facility]   ______________________________________________________________________    NEW PATIENT NOTE  Referring Provider: Shirline Frees, MD Primary Provider: Shirline Frees, MD Date of office visit: 07/03/2018    Subjective:   Chief Complaint:  Alexandra Gonzales (DOB: October 27, 1940) is a 78 y.o. female who presents to the clinic on 07/03/2018 with a chief complaint of Urticaria and Angioedema (throat ) .     HPI: [name] presents to this clinic and evaluation of several issues.  First, she states that she gets a red blotchy areas across her skin that are itchy and this has been an issue that has been present for several years.  It appears to occur from spring through fall season and is very intermittent.  There does not appear to be any associated systemic or constitutional symptoms associated with this issue.  She may get more itchy when she visits her daughter's house who has a dog and she thinks that her eyes get a little bit puffy as well when around the dog.  Similarly, when she gets around the cat she will develop eye swelling and itchiness.  Second, she has coughing especially when she eats and she thinks that there are various foods that precipitate this coughing.  However,It should be noted that it is really just the act of eating that precipitates this cough.  In addition, she has throat clearing all the time and she belches all the time.  She does  have reflux disease followed by Dr. Collene Mares.  She still has intermittent regurgitation even though she uses pantoprazole on a consistent basis.  She believes that she has had an upper endoscopy but it has been over 10 years ago.  She consumes a Coke about every 3 days and has chocolate about every other day but no other sources of caffeine.  Third, she may has some slight nasal congestion and occasional sneezing but she has no anosmia or ugly nasal discharge.  She does not have any associated shortness of breath or chest tightness with any of her complaints noted above.  Fourth, she states that when she ate shellfish many decades ago she developed throat swelling.  She has been without shellfish consumption other than eating deviled crab for many years.  Past Medical History:  Diagnosis Date  . Allergy   . Anxiety   . Breast cancer ([place])   . Cancer ([place])    questionable cancer to nipple  . Hyperlipidemia   . Hypertension   . IBS (irritable bowel syndrome)   . Insomnia   . Kidney stones   . Osteopenia   . Personal history of radiation therapy     Past Surgical History:  Procedure Laterality Date  . ABDOMINAL HYSTERECTOMY    . BREAST EXCISIONAL BIOPSY    . BREAST LUMPECTOMY Left    march 2000  . BREAST SURGERY     R lumpectomy 2000  . EXPLORATORY LAPAROTOMY    . EYE SURGERY  bilat cataract removal  . MOUTH SURGERY    . RADIOACTIVE SEED GUIDED EXCISIONAL BREAST BIOPSY Left 09/26/2016   Procedure: LEFT RADIOACTIVE SEED GUIDED EXCISIONAL BREAST BIOPSY ERAS PATHWAY;  Surgeon: Rolm Bookbinder, MD;  Location: [facility];  Service: General;  Laterality: Left;  LMA    Allergies as of 07/03/2018      Reactions   Morphine And Related Other (See Comments)   hallucinations   Codeine Nausea And Vomiting   Dexamethasone Nausea Only   Erythromycin Rash   Hydrocodone Nausea Only   Lyrica [pregabalin] Nausea And Vomiting   Prevacid [lansoprazole] Photosensitivity    Polytrim [polymyxin B-trimethoprim] Other (See Comments)   Sores in mouth, decreases memory   Penicillins Rash      Medication List    amLODipine 5 MG tablet Commonly known as: NORVASC   cyclobenzaprine 5 MG tablet Commonly known as: FLEXERIL cyclobenzaprine 5 mg tablet   enalapril 20 MG tablet Commonly known as: VASOTEC Take 20 mg by mouth daily.   loratadine 10 MG tablet Commonly known as: CLARITIN Take 10 mg by mouth daily.   pantoprazole 40 MG tablet Commonly known as: PROTONIX Take 40 mg by mouth daily.   Probiotic-10 Caps Take by mouth.       Review of systems negative except as noted in HPI / PMHx or noted below:  Review of Systems  Constitutional: Negative.   HENT: Negative.   Eyes: Negative.   Respiratory: Negative.   Cardiovascular: Negative.   Gastrointestinal: Negative.   Genitourinary: Negative.   Musculoskeletal: Negative.   Skin: Negative.   Neurological: Negative.   Endo/Heme/Allergies: Negative.   Psychiatric/Behavioral: Negative.     Family History  Problem Relation Age of Onset  . Congestive Heart Failure Mother        Died 74  . Kidney Stones Father        Died 48 - complications from kidney stone  . Breast cancer Sister 18    Social History   Socioeconomic History  . Marital status: Widowed    Spouse name: Not on file  . Number of children: 2  . Years of education: 10th  . Highest education level: Not on file  Occupational History  . Occupation: Retired  Scientific laboratory technician  . Financial resource strain: Not on file  . Food insecurity    Worry: Not on file    Inability: Not on file  . Transportation needs    Medical: Not on file    Non-medical: Not on file  Tobacco Use  . Smoking status: Never Smoker  . Smokeless tobacco: Never Used  Substance and Sexual Activity  . Alcohol use: No  . Drug use: No  . Sexual activity: Not on file  Lifestyle  . Physical activity    Days per week: Not on file    Minutes per session: Not  on file  . Stress: Not on file  Relationships  . Social Herbalist on phone: Not on file    Gets together: Not on file    Attends religious service: Not on file    Active member of club or organization: Not on file    Attends meetings of clubs or organizations: Not on file    Relationship status: Not on file  . Intimate partner violence    Fear of current or ex partner: Not on file    Emotionally abused: Not on file    Physically abused: Not on file  Forced sexual activity: Not on file  Other Topics Concern  . Not on file  Social History Narrative   Lives at home alone.   Right-handed.   Occasional caffeine use.    Environmental and Social history  Lives in a house with a dry environment, no animals located inside the household, carpet in the bedroom, plastic on the bed, plastic on the pillow, and no smoking ongoing with inside the household.  Objective:   Vitals:   07/03/18 1331  BP: 130/60  Pulse: 71  Resp: 16  Temp: 98.7 F (37.1 C)  SpO2: 94%   Height: 5\' 1"  (154.9 cm) Weight: 149 lb 12.8 oz (67.9 kg)  Physical Exam Constitutional:      Appearance: She is not diaphoretic.  HENT:     Head: Normocephalic. No right periorbital erythema or left periorbital erythema.     Right Ear: Tympanic membrane, ear canal and external ear normal.     Left Ear: Tympanic membrane, ear canal and external ear normal.     Nose: Nose normal. No mucosal edema or rhinorrhea.     Mouth/Throat:     Pharynx: No oropharyngeal exudate.  Eyes:     General: Lids are normal.     Conjunctiva/sclera: Conjunctivae normal.     Pupils: Pupils are equal, round, and reactive to light.  Neck:     Thyroid: No thyromegaly.     Trachea: Trachea normal. No tracheal deviation.  Cardiovascular:     Rate and Rhythm: Normal rate and regular rhythm.     Heart sounds: Normal heart sounds, S1 normal and S2 normal. No murmur.  Pulmonary:     Effort: Pulmonary effort is normal. No respiratory  distress.     Breath sounds: No stridor. No wheezing or rales.  Chest:     Chest wall: No tenderness.  Abdominal:     General: There is no distension.     Palpations: Abdomen is soft. There is no mass.     Tenderness: There is no abdominal tenderness. There is no guarding or rebound.  Musculoskeletal:        General: No tenderness.  Lymphadenopathy:     Head:     Right side of head: No tonsillar adenopathy.     Left side of head: No tonsillar adenopathy.     Cervical: No cervical adenopathy.  Skin:    Coloration: Skin is not pale.     Findings: No erythema or rash.     Nails: There is no clubbing.   Neurological:     Mental Status: She is alert.     Diagnostics: Allergy skin tests were performed.  She did not demonstrate any hypersensitivity against a screening panel of aeroallergens or foods.  Spirometry was performed and demonstrated an FEV1 of 1.29 @ 75 % of predicted. FEV1/FVC = 0.77  Results of a head CT scan obtained 04 July 2016 identified the following:  Sinuses/Orbits: No acute orbital abnormality. Visualized sinuses clear.  Results of a chest x-ray obtained 16 March 2018 identified the following:  Cardiomediastinal silhouette is mildly enlarged. Mediastinal contours appear intact. There is no evidence of focal airspace consolidation, pleural effusion or pneumothorax. Osseous structures are without acute abnormality. Postsurgical changes in the right chest wall/breast.  Results of a barium swallow obtained 04 September 2015 identified the following:  Patient was administered barium contrast orally during fluoroscopic evaluation. Thoracic esophagus appeared normal without mass, ulceration or other focal wall irregularity. Contrast moved promptly through the thoracic esophagus and  into the stomach without evidence of obstruction or dysmotility. No hiatal hernia seen despite Valsalva maneuvers. No gastroesophageal reflux visualized.  Results of blood tests obtained  13 June 2018 identifies creatinine 0.55 mg/DL, AST 17 U/L, ALT 14 U/L, hemoglobin 13.8, absolute eosinophil 100, absolute lymphocyte 2800, platelet 278.  Assessment and Plan:    1. LPRD (laryngopharyngeal reflux disease)   2. Allergic urticaria   3. Other allergic rhinitis   4. On angiotensin-converting enzyme (ACE) inhibitors     1.  Allergen avoidance measures?  2.  Treat and prevent immune system overactivity:   A.  Montelukast 10 mg - 1 tablet daily  B.  Cetirizine 10 mg - 1 tablet daily  3.  Treat and prevent reflux:   A.  Eliminate all consumption of caffeine and chocolate  B.  Pantoprazole 40 mg - 1 tablet in a.m.  C.  Famotidine 40 mg - 1 tablet in p.m.  4.  Change enalapril to losartan 50 mg daily.  Check blood pressure  5.  Blood -shellfish IgE panel, alpha gal panel, TSH, T4, TP    6.  Return to clinic in 4 weeks or earlier if problem  It does sound as though Laquetta has a overactive immune system although it is not entirely clear that atopic disease is contributing to this issue.  I am going to treat her with a combination of a leukotriene modifier and a H 1 receptor blocker on a consistent basis in an attempt to minimize any immune system overactivity and her history is quite consistent with LPR and we will be treating her with a combination of a proton pump inhibitor and H2 receptor blocker and get her to eliminate her consumption of chocolate and occasional caffeine.  Given the fact that she does have these intermittent episodes of feeling as though her throat is swollen up we will eliminate her enalapril and give her losartan and she will need to check her blood pressure and follow-up with her primary care doctor regarding further management of this issue.  To be complete we will check some blood test looking for a systemic disease contributing to some of her symptoms and also further investigating for the possibility of atopic disease.  I will see her back in this clinic  in 4 weeks or earlier if there is a problem.  Jiles Prows, MD Allergy / Immunology [facility]

## 2018-07-04 ENCOUNTER — Encounter: Payer: Self-pay | Admitting: Allergy and Immunology

## 2018-07-06 ENCOUNTER — Other Ambulatory Visit: Payer: Self-pay | Admitting: Family Medicine

## 2018-07-06 DIAGNOSIS — Z1231 Encounter for screening mammogram for malignant neoplasm of breast: Secondary | ICD-10-CM

## 2018-07-08 ENCOUNTER — Other Ambulatory Visit: Payer: Self-pay

## 2018-07-08 ENCOUNTER — Encounter (HOSPITAL_COMMUNITY): Payer: Self-pay | Admitting: Emergency Medicine

## 2018-07-08 ENCOUNTER — Emergency Department (HOSPITAL_COMMUNITY): Payer: Medicare Other

## 2018-07-08 ENCOUNTER — Ambulatory Visit (INDEPENDENT_AMBULATORY_CARE_PROVIDER_SITE_OTHER)
Admission: EM | Admit: 2018-07-08 | Discharge: 2018-07-08 | Disposition: A | Discharge status: Home / Self Care | Payer: Medicare Other | Source: Home / Self Care

## 2018-07-08 ENCOUNTER — Emergency Department (HOSPITAL_COMMUNITY)
Admission: EM | Admit: 2018-07-08 | Discharge: 2018-07-08 | Disposition: A | Discharge status: Home / Self Care | Payer: Medicare Other | Attending: Emergency Medicine | Admitting: Emergency Medicine

## 2018-07-08 ENCOUNTER — Encounter (HOSPITAL_COMMUNITY): Payer: Self-pay | Admitting: *Deleted

## 2018-07-08 DIAGNOSIS — I10 Essential (primary) hypertension: Secondary | ICD-10-CM | POA: Insufficient documentation

## 2018-07-08 DIAGNOSIS — R110 Nausea: Secondary | ICD-10-CM

## 2018-07-08 DIAGNOSIS — R1032 Left lower quadrant pain: Secondary | ICD-10-CM

## 2018-07-08 DIAGNOSIS — Z853 Personal history of malignant neoplasm of breast: Secondary | ICD-10-CM | POA: Diagnosis not present

## 2018-07-08 DIAGNOSIS — R109 Unspecified abdominal pain: Secondary | ICD-10-CM

## 2018-07-08 DIAGNOSIS — N200 Calculus of kidney: Secondary | ICD-10-CM | POA: Diagnosis not present

## 2018-07-08 DIAGNOSIS — Z79899 Other long term (current) drug therapy: Secondary | ICD-10-CM | POA: Insufficient documentation

## 2018-07-08 HISTORY — DX: Gastro-esophageal reflux disease without esophagitis: K21.9

## 2018-07-08 LAB — COMPREHENSIVE METABOLIC PANEL
ALT: 25 U/L (ref 0–44)
AST: 23 U/L (ref 15–41)
Albumin: 3.9 g/dL (ref 3.5–5.0)
Alkaline Phosphatase: 77 U/L (ref 38–126)
Anion gap: 9 (ref 5–15)
BUN: 12 mg/dL (ref 8–23)
CO2: 26 mmol/L (ref 22–32)
Calcium: 9.3 mg/dL (ref 8.9–10.3)
Chloride: 105 mmol/L (ref 98–111)
Creatinine, Ser: 0.66 mg/dL (ref 0.44–1.00)
GFR calc Af Amer: >60 mL/min (ref >60)
GFR calc non Af Amer: >60 mL/min (ref >60)
Glucose, Bld: 130 mg/dL — ABNORMAL HIGH (ref 70–99)
Potassium: 4.1 mmol/L (ref 3.5–5.1)
Sodium: 140 mmol/L (ref 135–145)
Total Bilirubin: 0.6 mg/dL (ref 0.3–1.2)
Total Protein: 6.7 g/dL (ref 6.5–8.1)

## 2018-07-08 LAB — POCT URINALYSIS DIP (DEVICE)
Bilirubin Urine: NEGATIVE
Glucose, UA: NEGATIVE mg/dL
Ketones, ur: NEGATIVE mg/dL
Leukocytes,Ua: NEGATIVE
Nitrite: NEGATIVE
Protein, ur: NEGATIVE mg/dL
Specific Gravity, Urine: 1.02 (ref 1.005–1.030)
Urobilinogen, UA: 0.2 mg/dL (ref 0.0–1.0)
pH: 8.5 — ABNORMAL HIGH (ref 5.0–8.0)

## 2018-07-08 LAB — URINALYSIS, ROUTINE W REFLEX MICROSCOPIC
Bacteria, UA: NONE SEEN
Bilirubin Urine: NEGATIVE
Glucose, UA: NEGATIVE mg/dL
Ketones, ur: NEGATIVE mg/dL
Leukocytes,Ua: NEGATIVE
Nitrite: NEGATIVE
Protein, ur: NEGATIVE mg/dL
RBC / HPF: >50 RBC/hpf — ABNORMAL HIGH (ref 0–5)
Specific Gravity, Urine: 1.012 (ref 1.005–1.030)
pH: 9 — ABNORMAL HIGH (ref 5.0–8.0)

## 2018-07-08 LAB — CBC WITH DIFFERENTIAL/PLATELET
Abs Immature Granulocytes: 0.03 10*3/uL (ref 0.00–0.07)
Basophils Absolute: 0.1 10*3/uL (ref 0.0–0.1)
Basophils Relative: 1 %
Eosinophils Absolute: 0 10*3/uL (ref 0.0–0.5)
Eosinophils Relative: 0 %
HCT: 43.9 % (ref 36.0–46.0)
Hemoglobin: 14.5 g/dL (ref 12.0–15.0)
Immature Granulocytes: 0 %
Lymphocytes Relative: 13 %
Lymphs Abs: 1.4 10*3/uL (ref 0.7–4.0)
MCH: 29.2 pg (ref 26.0–34.0)
MCHC: 33 g/dL (ref 30.0–36.0)
MCV: 88.3 fL (ref 80.0–100.0)
Monocytes Absolute: 0.5 10*3/uL (ref 0.1–1.0)
Monocytes Relative: 5 %
Neutro Abs: 8.8 10*3/uL — ABNORMAL HIGH (ref 1.7–7.7)
Neutrophils Relative %: 81 %
Platelets: 285 10*3/uL (ref 150–400)
RBC: 4.97 MIL/uL (ref 3.87–5.11)
RDW: 13.3 % (ref 11.5–15.5)
WBC: 10.8 10*3/uL — ABNORMAL HIGH (ref 4.0–10.5)
nRBC: 0 % (ref 0.0–0.2)

## 2018-07-08 LAB — LIPASE, BLOOD: Lipase: 28 U/L (ref 11–51)

## 2018-07-08 MED ORDER — TAMSULOSIN HCL 0.4 MG PO CAPS: 0.4000 mg | ORAL_CAPSULE | Freq: Every day | ORAL | 0 refills | Status: DC

## 2018-07-08 MED ORDER — IOHEXOL 300 MG/ML  SOLN
100.0000 mL | Freq: Once | INTRAMUSCULAR | Status: AC | PRN
  Administered 2018-07-08: 100 mL via INTRAVENOUS

## 2018-07-08 MED ORDER — ONDANSETRON 4 MG PO TBDP: 4.0000 mg | ORAL_TABLET | Freq: Three times a day (TID) | ORAL | 0 refills | Status: DC | PRN

## 2018-07-08 MED ORDER — TRAMADOL HCL 50 MG PO TABS: 50.0000 mg | ORAL_TABLET | Freq: Four times a day (QID) | ORAL | 0 refills | Status: DC | PRN

## 2018-07-08 MED ORDER — PROMETHAZINE HCL 25 MG PO TABS: 25.0000 mg | ORAL_TABLET | Freq: Four times a day (QID) | ORAL | 0 refills | Status: DC | PRN

## 2018-07-08 NOTE — ED Notes (Signed)
ED Provider at bedside. 

## 2018-07-08 NOTE — ED Triage Notes (Signed)
Pt reports having abd spasms. States she was at urgent care and wanted to get it checked out to make sure it wouldn't;t happen tonight again. Also reports some burning in her vagina.

## 2018-07-08 NOTE — ED Notes (Signed)
Advised to go to ED FROM PROVIDER

## 2018-07-08 NOTE — Discharge Instructions (Signed)
As per your preference, go to the ED for further evaluation.

## 2018-07-08 NOTE — ED Triage Notes (Signed)
C/O starting yesterday with slight abd pain yesterday.  Today c/o severe left flank pain with pain "in my vagina in up in my rectum" as well.  C/O nausea, but "not able to vomit".

## 2018-07-08 NOTE — ED Notes (Signed)
Patient transported to CT 

## 2018-07-08 NOTE — Discharge Instructions (Addendum)
Take Tylenol 1000 mg 4 times a day for 1 week. This is the maximum dose of Tylenol (acetaminophen) you can take from all sources. Please check other over-the-counter medications and prescriptions to ensure you are not taking other medications that contain acetaminophen.  You may also take ibuprofen 400 mg 6 times a day alternating with or at the same time as tylenol.  Take tramadol as needed for breakthrough pain.  This medication can be addicting, sedating and cause constipation.

## 2018-07-08 NOTE — ED Provider Notes (Signed)
[facility]    CSN: [id number] Arrival date & time: 07/08/18  1700     History   Chief Complaint Chief Complaint  Patient presents with  . Flank Pain  . Nausea    HPI Alexandra Gonzales is a 78 y.o. female.   78 year old female with history of breast cancer in remission, GERD, HLD, HTN comes in for acute onset of abdominal pain.  Patient is a poor historian, for stated symptoms started yesterday, now stating pain started after eating this afternoon.  States pain is severe, "I thought I was dying".  States she was laying in bed "screaming because it hurts so bad".  She states she took some Maalox with mild relief.  Since pain started, she has not had any aggravating or alleviating factor.  She feels nauseous but "not able to vomit".  She had 2 bowel movements today, states one after the pain started with mild relief of pain.  Denies diarrhea or constipation.  She states since then, has had increased bloating and not really passing flatus.  She does have increased burping.  Denies fever, chills, night sweats.  States has dysuria without hematuria or urinary frequency.  She feels that the pain is "in my vagina and in my rectum".  History of abdominal hysterectomy, exploratory laparotomy.     Past Medical History:  Diagnosis Date  . Allergy   . Anxiety   . Breast cancer ([place])   . Cancer ([place])    questionable cancer to nipple  . GERD (gastroesophageal reflux disease)   . Hyperlipidemia   . Hypertension   . IBS (irritable bowel syndrome)   . Insomnia   . Kidney stones   . Osteopenia   . Personal history of radiation therapy     Patient Active Problem List   Diagnosis Date Noted  . Facial spasm 02/29/2016  . Esophageal reflux 02/18/2016  . Neuropathy 02/18/2016    Past Surgical History:  Procedure Laterality Date  . ABDOMINAL HYSTERECTOMY    . BREAST EXCISIONAL BIOPSY    . BREAST LUMPECTOMY Left    march 2000  . BREAST SURGERY     R lumpectomy 2000  .  EXPLORATORY LAPAROTOMY    . EYE SURGERY     bilat cataract removal  . MOUTH SURGERY    . RADIOACTIVE SEED GUIDED EXCISIONAL BREAST BIOPSY Left 09/26/2016   Procedure: LEFT RADIOACTIVE SEED GUIDED EXCISIONAL BREAST BIOPSY ERAS PATHWAY;  Surgeon: Rolm Bookbinder, MD;  Location: [facility];  Service: General;  Laterality: Left;  LMA    OB History   No obstetric history on file.      Home Medications    Prior to Admission medications   Medication Sig Start Date End Date Taking? Authorizing Provider  amLODipine (NORVASC) 5 MG tablet  03/14/18  Yes [provider]  cetirizine (ZYRTEC) 10 MG tablet Take 1 tablet (10 mg total) by mouth daily. 07/03/18  Yes Kozlow, Donnamarie Poag, MD  cyclobenzaprine (FLEXERIL) 5 MG tablet cyclobenzaprine 5 mg tablet   Yes [provider]  famotidine (PEPCID) 40 MG tablet Take 1 tablet (40 mg total) by mouth at bedtime. 07/03/18  Yes Kozlow, Donnamarie Poag, MD  losartan (COZAAR) 50 MG tablet Take 1 tablet (50 mg total) by mouth daily. 07/03/18  Yes Kozlow, Donnamarie Poag, MD  montelukast (SINGULAIR) 10 MG tablet Take 1 tablet (10 mg total) by mouth at bedtime. 07/03/18  Yes Kozlow, Donnamarie Poag, MD  pantoprazole (PROTONIX) 40 MG tablet Take  40 mg by mouth daily.   Yes [provider]  enalapril (VASOTEC) 20 MG tablet Take 20 mg by mouth daily.    [provider]  loratadine (CLARITIN) 10 MG tablet Take 10 mg by mouth daily.    [provider]  Probiotic Product (PROBIOTIC-10) CAPS Take by mouth.    [provider]    Family History Family History  Problem Relation Age of Onset  . Congestive Heart Failure Mother        Died 74  . Kidney Stones Father        Died 48 - complications from kidney stone  . Breast cancer Sister 33    Social History Social History   Tobacco Use  . Smoking status: Never Smoker  . Smokeless tobacco: Never Used  Substance Use Topics  . Alcohol use: No  . Drug use: No     Allergies    Morphine and related, Codeine, Dexamethasone, Erythromycin, Hydrocodone, Lyrica [pregabalin], Prevacid [lansoprazole], Polytrim [polymyxin b-trimethoprim], and Penicillins   Review of Systems Review of Systems  Reason unable to perform ROS: See HPI as above.     Physical Exam Triage Vital Signs ED Triage Vitals  Enc Vitals Group     BP 07/08/18 1752 (!) 151/57     Pulse Rate 07/08/18 1752 64     Resp 07/08/18 1752 20     Temp 07/08/18 1755 97.8 F (36.6 C)     Temp Source 07/08/18 1755 Oral     SpO2 07/08/18 1752 97 %     Weight --      Height --      Head Circumference --      Peak Flow --      Pain Score 07/08/18 1753 10     Pain Loc --      Pain Edu? --      Excl. in [place]? --    No data found.  Updated Vital Signs BP (!) 151/57   Pulse 64   Temp 97.8 F (36.6 C) (Oral)   Resp 20   SpO2 97%     Physical Exam Constitutional:      General: She is not in acute distress.    Appearance: Normal appearance. She is well-developed. She is not ill-appearing, toxic-appearing or diaphoretic.     Comments: Does not appear in any distress or pain.  Standing and sitting comfortably.  HENT:     Head: Normocephalic and atraumatic.  Eyes:     Conjunctiva/sclera: Conjunctivae normal.     Pupils: Pupils are equal, round, and reactive to light.  Cardiovascular:     Rate and Rhythm: Normal rate and regular rhythm.     Heart sounds: Normal heart sounds. No murmur. No friction rub. No gallop.   Pulmonary:     Effort: Pulmonary effort is normal.     Breath sounds: Normal breath sounds. No wheezing or rales.  Abdominal:     Comments: Abdomen slightly distended.  Soft to palpation.  Normal bowel sounds.  Patient with guarding throughout, worse at the left lower quadrant without obvious rebound.  No CVA tenderness.  Skin:    General: Skin is warm and dry.  Neurological:     Mental Status: She is alert and oriented to person, place, and time.  Psychiatric:        Behavior: Behavior  normal.        Judgment: Judgment normal.      UC Treatments / Results  Labs (all  labs ordered are listed, but only abnormal results are displayed) Labs Reviewed  POCT URINALYSIS DIP (DEVICE) - Abnormal; Notable for the following components:      Result Value   Hgb urine dipstick LARGE (*)    pH 8.5 (*)    All other components within normal limits    EKG None  Radiology No results found.  Procedures Procedures (including critical care time)  Medications Ordered in UC Medications - No data to display  Initial Impression / Assessment and Plan / UC Course  I have reviewed the triage vital signs and the nursing notes.  Pertinent labs & imaging results that were available during my care of the patient were reviewed by me and considered in my medical decision making (see chart for details).    Urine with large blood.  Given left lower quadrant pain with some changes in bowel movements/bloating sensation, discussed possibility of diverticulitis.  Although patient states to be in severe pain, she is sitting comfortably at this time.  Some guarding without rebound on exam.  Discussed treatment for diverticulitis with strict return precautions and close follow-up.  Patient stating "every time I change medications I have problems".  Also stating "I have a lot of problems with antibiotics".  Patient would like to be seen at the emergency department for further evaluation prior to starting any medications.  Patient discharged in stable condition to the emergency department for further evaluation.  Final Clinical Impressions(s) / UC Diagnoses   Final diagnoses:  LLQ abdominal pain  Nausea    ED Prescriptions    None        Ok Edwards, PA-C 07/08/18 1830

## 2018-07-08 NOTE — ED Provider Notes (Signed)
[facility] EMERGENCY DEPARTMENT Provider Note   CSN: [id number] Arrival date & time: 07/08/18  1832    History   Chief Complaint Chief Complaint  Patient presents with  . Abdominal Pain    HPI Alexandra Gonzales is a 78 y.o. female.     HPI  Yesterday began to have some stomach pain, at at 120PM ate then 230PM was in the bed with severe pain. Was screaming in pain.  Was shaking all over because of pain.  Hx of chronic bloating not worse.  Having nausea but could not throw up.  Felt gas. Has not been able to pass flatus since yesterday, but did have 2BM today.  No diarrhea.  Dysuria after urinating, had 6/23 at dr.  Now having mild pain but not as severe. Was better after taking miralax took for pain.    Middle abdomen radiating to the LLQ and to the back and radiated to the vagina Does have hx of stomach spasms, which is why she is on abx "IB", since she has been on the abx she has not had any. When she was hurting severely took miralax, had 2BM and threw up some and felt a little better. Felt like could not belch or throw up. Has had kidney stones, a little bit felt similar.   Sees Dr. Collene Mares for stomach problems, stomach can't tolerate meat or raw foods  Past Medical History:  Diagnosis Date  . Allergy   . Anxiety   . Breast cancer ([place])   . Cancer ([place])    questionable cancer to nipple  . GERD (gastroesophageal reflux disease)   . Hyperlipidemia   . Hypertension   . IBS (irritable bowel syndrome)   . Insomnia   . Kidney stones   . Osteopenia   . Personal history of radiation therapy     Patient Active Problem List   Diagnosis Date Noted  . Facial spasm 02/29/2016  . Esophageal reflux 02/18/2016  . Neuropathy 02/18/2016    Past Surgical History:  Procedure Laterality Date  . ABDOMINAL HYSTERECTOMY    . BREAST EXCISIONAL BIOPSY    . BREAST LUMPECTOMY Left    march 2000  . BREAST SURGERY     R lumpectomy 2000  . EXPLORATORY LAPAROTOMY    . EYE  SURGERY     bilat cataract removal  . MOUTH SURGERY    . RADIOACTIVE SEED GUIDED EXCISIONAL BREAST BIOPSY Left 09/26/2016   Procedure: LEFT RADIOACTIVE SEED GUIDED EXCISIONAL BREAST BIOPSY ERAS PATHWAY;  Surgeon: Rolm Bookbinder, MD;  Location: [facility];  Service: General;  Laterality: Left;  LMA     OB History   No obstetric history on file.      Home Medications    Prior to Admission medications   Medication Sig Start Date End Date Taking? Authorizing Provider  amLODipine (NORVASC) 5 MG tablet  03/14/18   [provider]  cetirizine (ZYRTEC) 10 MG tablet Take 1 tablet (10 mg total) by mouth daily. 07/03/18   Kozlow, Donnamarie Poag, MD  cyclobenzaprine (FLEXERIL) 5 MG tablet cyclobenzaprine 5 mg tablet    [provider]  enalapril (VASOTEC) 20 MG tablet Take 20 mg by mouth daily.    [provider]  famotidine (PEPCID) 40 MG tablet Take 1 tablet (40 mg total) by mouth at bedtime. 07/03/18   Kozlow, Donnamarie Poag, MD  loratadine (CLARITIN) 10 MG tablet Take 10 mg by mouth daily.    [provider]  losartan (COZAAR) 50 MG tablet Take 1 tablet (50 mg total) by mouth daily. 07/03/18   Kozlow, Donnamarie Poag, MD  montelukast (SINGULAIR) 10 MG tablet Take 1 tablet (10 mg total) by mouth at bedtime. 07/03/18   Kozlow, Donnamarie Poag, MD  pantoprazole (PROTONIX) 40 MG tablet Take 40 mg by mouth daily.    [provider]  Probiotic Product (PROBIOTIC-10) CAPS Take by mouth.    [provider]  promethazine (PHENERGAN) 25 MG tablet Take 1 tablet (25 mg total) by mouth every 6 (six) hours as needed for nausea or vomiting. 07/08/18   Gareth Morgan, MD  tamsulosin (FLOMAX) 0.4 MG CAPS capsule Take 1 capsule (0.4 mg total) by mouth daily. 07/08/18   Gareth Morgan, MD  traMADol (ULTRAM) 50 MG tablet Take 1 tablet (50 mg total) by mouth every 6 (six) hours as needed. 07/08/18   Gareth Morgan, MD    Family History Family History  Problem Relation Age of  Onset  . Congestive Heart Failure Mother        Died 74  . Kidney Stones Father        Died 48 - complications from kidney stone  . Breast cancer Sister 47    Social History Social History   Tobacco Use  . Smoking status: Never Smoker  . Smokeless tobacco: Never Used  Substance Use Topics  . Alcohol use: No  . Drug use: No     Allergies   Morphine and related, Codeine, Dexamethasone, Erythromycin, Hydrocodone, Lyrica [pregabalin], Prevacid [lansoprazole], Polytrim [polymyxin b-trimethoprim], and Penicillins   Review of Systems Review of Systems  Constitutional: Negative for fever.  HENT: Negative for sore throat.   Eyes: Negative for visual disturbance.  Respiratory: Negative for cough and shortness of breath.   Cardiovascular: Negative for chest pain.  Gastrointestinal: Positive for abdominal pain, nausea and vomiting. Negative for constipation and diarrhea.  Genitourinary: Positive for dysuria. Negative for difficulty urinating and vaginal bleeding.  Musculoskeletal: Positive for back pain. Negative for neck pain.  Skin: Negative for rash.  Neurological: Negative for syncope and headaches.     Physical Exam Updated Vital Signs BP (!) 169/70   Pulse 79   Temp 98.4 F (36.9 C) (Oral)   Resp 16   Ht 5\' 1"  (1.549 m)   Wt 67.9 kg   SpO2 96%   BMI 28.30 kg/m   Physical Exam Vitals signs and nursing note reviewed.  Constitutional:      General: She is not in acute distress.    Appearance: She is well-developed. She is not diaphoretic.  HENT:     Head: Normocephalic and atraumatic.  Eyes:     Conjunctiva/sclera: Conjunctivae normal.  Neck:     Musculoskeletal: Normal range of motion.  Cardiovascular:     Rate and Rhythm: Normal rate and regular rhythm.     Heart sounds: Normal heart sounds. No murmur. No friction rub. No gallop.   Pulmonary:     Effort: Pulmonary effort is normal. No respiratory distress.     Breath sounds: Normal breath sounds. No  wheezing or rales.  Abdominal:     General: There is no distension.     Palpations: Abdomen is soft.     Tenderness: There is abdominal tenderness in the left lower quadrant. There is no guarding.  Musculoskeletal:        General: No tenderness.  Skin:    General: Skin is warm and dry.     Findings: No erythema or rash.  Neurological:     Mental Status: She is alert and oriented to person, place, and time.      ED Treatments / Results  Labs (all labs ordered are listed, but only abnormal results are displayed) Labs Reviewed  CBC WITH DIFFERENTIAL/PLATELET - Abnormal; Notable for the following components:      Result Value   WBC 10.8 (*)    Neutro Abs 8.8 (*)    All other components within normal limits  COMPREHENSIVE METABOLIC PANEL - Abnormal; Notable for the following components:   Glucose, Bld 130 (*)    All other components within normal limits  URINALYSIS, ROUTINE W REFLEX MICROSCOPIC - Abnormal; Notable for the following components:   Color, Urine STRAW (*)    pH 9.0 (*)    Hgb urine dipstick LARGE (*)    RBC / HPF >50 (*)    All other components within normal limits  URINE CULTURE  LIPASE, BLOOD    EKG None  Radiology Ct Abdomen Pelvis W Contrast  Result Date: 07/08/2018 CLINICAL DATA:  No diverticulitis follow-up. History of kidney stones and IBS. EXAM: CT ABDOMEN AND PELVIS WITH CONTRAST TECHNIQUE: Multidetector CT imaging of the abdomen and pelvis was performed using the standard protocol following bolus administration of intravenous contrast. CONTRAST:  125mL OMNIPAQUE IOHEXOL 300 MG/ML  SOLN COMPARISON:  CT abdomen pelvis dated May 27, 2014 FINDINGS: Lower chest: The lung bases are clear. The heart size is normal. Hepatobiliary: The liver is normal. Normal gallbladder.There is no biliary ductal dilation. Pancreas: Normal contours without ductal dilatation. No peripancreatic fluid collection. Spleen: No splenic laceration or hematoma. Adrenals/Urinary Tract:  --Adrenal glands: No adrenal hemorrhage. --Right kidney/ureter: There is a stable hyperdense cyst arising from the upper pole. There is no hydronephrosis. There are no radiopaque obstructing kidney stones. --Left kidney/ureter: There is mild left-sided hydroureteronephrosis secondary to an obstructing 2-3 mm stone at the left UVJ. --Urinary bladder: Unremarkable. Stomach/Bowel: --Stomach/Duodenum: No hiatal hernia or other gastric abnormality. Normal duodenal course and caliber. --Small bowel: No dilatation or inflammation. --Colon: No focal abnormality. --Appendix: Not visualized. No right lower quadrant inflammation or free fluid. Vascular/Lymphatic: Atherosclerotic calcification is present within the non-aneurysmal abdominal aorta, without hemodynamically significant stenosis. --No retroperitoneal lymphadenopathy. --No mesenteric lymphadenopathy. --No pelvic or inguinal lymphadenopathy. Reproductive: Status post hysterectomy. No adnexal mass. Other: No ascites or free air. The abdominal wall is normal. Musculoskeletal. No acute displaced fractures. There is a stable lipoma in the right iliacus muscle. IMPRESSION: 1. Mild left-sided hydroureteronephrosis secondary to an obstructing 2-3 mm stone at the left UVJ. 2. No residual radiopaque stones identified in either kidney. 3. No CT evidence for diverticulitis. Electronically Signed   By: Constance Holster M.D.   On: 07/08/2018 21:37    Procedures Procedures (including critical care time)  Medications Ordered in ED Medications  iohexol (OMNIPAQUE) 300 MG/ML solution 100 mL (100 mLs Intravenous Contrast Given 07/08/18 2120)     Initial Impression / Assessment and Plan / ED Course  I have reviewed the triage vital signs and the nursing notes.  Pertinent labs & imaging results that were available during my care of the patient were reviewed by me and considered in my medical decision making (see chart for details).        78yo female with history  above presents with concern for left sided abdominal pain and flank pain. DDx includes pyelonephritis, diverticulitis, nephrolithiasis, AAA.   UA without infection. CT shows 2-74mm UVJ stone with mild hydronephrosis. Discussed importance of hydration.  Gave rx for flomax, rec tylenol/ibuprofen with tramadol for breakthrough pain. Pt requesting phenergan, reports this has worked well for her nausea and pain in the past.  Discussed reasons for return and recommend follow up with Urology. Patient discharged in stable condition with understanding of reasons to return.   Final Clinical Impressions(s) / ED Diagnoses   Final diagnoses:  Left flank pain  Nephrolithiasis    ED Discharge Orders         Ordered    tamsulosin (FLOMAX) 0.4 MG CAPS capsule  Daily     07/08/18 2203    traMADol (ULTRAM) 50 MG tablet  Every 6 hours PRN     07/08/18 2209    ondansetron (ZOFRAN ODT) 4 MG disintegrating tablet  Every 8 hours PRN,   Status:  Discontinued     07/08/18 2209    promethazine (PHENERGAN) 25 MG tablet  Every 6 hours PRN     07/08/18 2216           Gareth Morgan, MD 07/09/18 1441

## 2018-07-08 NOTE — ED Notes (Signed)
Discharge instructions discussed with pt. Pt verbalized understanding. No questions at this time. Pt to go home with daughter.

## 2018-07-09 LAB — URINE CULTURE: Culture: <10000 — AB

## 2018-07-10 LAB — ALLERGEN PROFILE, SHELLFISH
Clam IgE: <0.1 kU/L
F023-IgE Crab: <0.1 kU/L
F080-IgE Lobster: <0.1 kU/L
F290-IgE Oyster: <0.1 kU/L
Scallop IgE: <0.1 kU/L
Shrimp IgE: <0.1 kU/L

## 2018-07-10 LAB — ALPHA-GAL PANEL
Alpha Gal IgE*: <0.1 kU/L (ref <0.10)
Beef (Bos spp) IgE: <0.1 kU/L (ref <0.35)
Class Interpretation: 0
Class Interpretation: 0
Class Interpretation: 0
Lamb/Mutton (Ovis spp) IgE: <0.1 kU/L (ref <0.35)
Pork (Sus spp) IgE: <0.1 kU/L (ref <0.35)

## 2018-07-10 LAB — T4, FREE: Free T4: 1.2 ng/dL (ref 0.82–1.77)

## 2018-07-10 LAB — THYROID PEROXIDASE ANTIBODY: Thyroperoxidase Ab SerPl-aCnc: 7 IU/mL (ref 0–34)

## 2018-07-10 LAB — TSH: TSH: 0.746 u[IU]/mL (ref 0.450–4.500)

## 2018-07-11 ENCOUNTER — Telehealth: Payer: Self-pay | Admitting: Allergy and Immunology

## 2018-07-11 NOTE — Telephone Encounter (Signed)
Dr Kozlow please advise 

## 2018-07-11 NOTE — Telephone Encounter (Signed)
Daughter calling back for results.  Please call daughter with results and not the patient.

## 2018-07-11 NOTE — Telephone Encounter (Signed)
Patient's daughter is calling to see if her test results are in yet.

## 2018-08-07 ENCOUNTER — Other Ambulatory Visit: Payer: Self-pay

## 2018-08-07 ENCOUNTER — Ambulatory Visit (INDEPENDENT_AMBULATORY_CARE_PROVIDER_SITE_OTHER): Payer: Medicare Other | Admitting: Allergy and Immunology

## 2018-08-07 ENCOUNTER — Encounter: Payer: Self-pay | Admitting: Allergy and Immunology

## 2018-08-07 VITALS — BP 158/62 | HR 65 | Temp 98.6°F | Resp 16 | Ht 62.0 in | Wt 148.4 lb

## 2018-08-07 DIAGNOSIS — K219 Gastro-esophageal reflux disease without esophagitis: Secondary | ICD-10-CM | POA: Diagnosis not present

## 2018-08-07 DIAGNOSIS — J3089 Other allergic rhinitis: Secondary | ICD-10-CM | POA: Diagnosis not present

## 2018-08-07 DIAGNOSIS — L500 Allergic urticaria: Secondary | ICD-10-CM | POA: Diagnosis not present

## 2018-08-07 MED ORDER — PANTOPRAZOLE SODIUM 40 MG PO TBEC: 40.0000 mg | DELAYED_RELEASE_TABLET | Freq: Every day | ORAL | 1 refills | Status: DC

## 2018-08-07 NOTE — Progress Notes (Signed)
[name]   Follow-up Note  Referring Provider: Shirline Frees, MD Primary Provider: Shirline Frees, MD Date of Office Visit: 08/07/2018  Subjective:   Alexandra Gonzales (DOB: 21-Apr-1940) is a 78 y.o. female who returns to the [facility] on 08/07/2018 in re-evaluation of the following:  HPI: Alexandra Gonzales returns to this clinic in reevaluation of her LPR, urticaria, and rhinitis.  I last saw her in this clinic during her initial evaluation of 03 July 2018 at which point in time we addressed each issue.  She has discontinued her ACE inhibitor and is now using a ARB and she has been consistently treating reflux with a combination of her preexistent pantoprazole plus additional famotidine and has been making an attempt to consolidate her caffeine and chocolate consumption.  While doing so she has had almost complete elimination of all of her throat sensations.  She no longer coughs when she eats and she no longer feels as though her throat is swelling and she no longer has throat clearing and she has resolved all of her belching and regurgitation.  She has not had any skin issues.  She does not have patches of itchy skin.  She has no nasal symptoms.  Since I have seen her in this clinic she has had a kidney stone and apparently required a urgent care/ER evaluation and treatment but fortunately that issue appears to have resolved.  Allergies as of 08/07/2018      Reactions   Morphine And Related Other (See Comments)   hallucinations   Codeine Nausea And Vomiting   Dexamethasone Nausea Only   Erythromycin Rash   Hydrocodone Nausea Only   Lyrica [pregabalin] Nausea And Vomiting   Prevacid [lansoprazole] Photosensitivity   Polytrim [polymyxin B-trimethoprim] Other (See Comments)   Sores in mouth, decreases memory   Penicillins Rash      Medication List    amLODipine 5 MG tablet Commonly known as: NORVASC   cetirizine 10 MG tablet  Commonly known as: ZYRTEC Take 1 tablet (10 mg total) by mouth daily.   cyclobenzaprine 5 MG tablet Commonly known as: FLEXERIL cyclobenzaprine 5 mg tablet   famotidine 40 MG tablet Commonly known as: PEPCID Take 1 tablet (40 mg total) by mouth at bedtime.   loratadine 10 MG tablet Commonly known as: CLARITIN Take 10 mg by mouth daily.   losartan 50 MG tablet Commonly known as: COZAAR Take 1 tablet (50 mg total) by mouth daily.   montelukast 10 MG tablet Commonly known as: SINGULAIR Take 1 tablet (10 mg total) by mouth at bedtime.   Myrbetriq 25 MG Tb24 tablet Generic drug: mirabegron ER Take 25 mg by mouth daily.   pantoprazole 40 MG tablet Commonly known as: PROTONIX Take 40 mg by mouth daily.   Probiotic-10 Caps Take by mouth.   promethazine 25 MG tablet Commonly known as: PHENERGAN Take 1 tablet (25 mg total) by mouth every 6 (six) hours as needed for nausea or vomiting.   tamsulosin 0.4 MG Caps capsule Commonly known as: FLOMAX Take 1 capsule (0.4 mg total) by mouth daily.   traMADol 50 MG tablet Commonly known as: ULTRAM Take 1 tablet (50 mg total) by mouth every 6 (six) hours as needed.       Past Medical History:  Diagnosis Date  . Allergy   . Anxiety   . Breast cancer ([place])   . Cancer ([place])    questionable cancer to nipple  .  GERD (gastroesophageal reflux disease)   . Hyperlipidemia   . Hypertension   . IBS (irritable bowel syndrome)   . Insomnia   . Kidney stones   . Osteopenia   . Personal history of radiation therapy     Past Surgical History:  Procedure Laterality Date  . ABDOMINAL HYSTERECTOMY    . BREAST EXCISIONAL BIOPSY    . BREAST LUMPECTOMY Left    march 2000  . BREAST SURGERY     R lumpectomy 2000  . EXPLORATORY LAPAROTOMY    . EYE SURGERY     bilat cataract removal  . MOUTH SURGERY    . RADIOACTIVE SEED GUIDED EXCISIONAL BREAST BIOPSY Left 09/26/2016   Procedure: LEFT RADIOACTIVE SEED GUIDED EXCISIONAL BREAST BIOPSY  ERAS PATHWAY;  Surgeon: Rolm Bookbinder, MD;  Location: [facility];  Service: General;  Laterality: Left;  LMA    Review of systems negative except as noted in HPI / PMHx or noted below:  Review of Systems  Constitutional: Negative.   HENT: Negative.   Eyes: Negative.   Respiratory: Negative.   Cardiovascular: Negative.   Gastrointestinal: Negative.   Genitourinary: Negative.   Musculoskeletal: Negative.   Skin: Negative.   Neurological: Negative.   Endo/Heme/Allergies: Negative.   Psychiatric/Behavioral: Negative.      Objective:   Vitals:   08/07/18 1122  BP: (!) 158/62  Pulse: 65  Resp: 16  Temp: 98.6 F (37 C)  SpO2: 97%   Height: 5\' 2"  (157.5 cm)  Weight: 148 lb 6.4 oz (67.3 kg)    Physical Exam Constitutional:      Appearance: She is not diaphoretic.  HENT:     Head: Normocephalic.     Right Ear: Tympanic membrane, ear canal and external ear normal.     Left Ear: Tympanic membrane, ear canal and external ear normal.     Nose: Nose normal. No mucosal edema or rhinorrhea.     Mouth/Throat:     Pharynx: Uvula midline. No oropharyngeal exudate.  Eyes:     Conjunctiva/sclera: Conjunctivae normal.  Neck:     Thyroid: No thyromegaly.     Trachea: Trachea normal. No tracheal tenderness or tracheal deviation.  Cardiovascular:     Rate and Rhythm: Normal rate and regular rhythm.     Heart sounds: Normal heart sounds, S1 normal and S2 normal. No murmur.  Pulmonary:     Effort: No respiratory distress.     Breath sounds: Normal breath sounds. No stridor. No wheezing or rales.  Lymphadenopathy:     Head:     Right side of head: No tonsillar adenopathy.     Left side of head: No tonsillar adenopathy.     Cervical: No cervical adenopathy.  Skin:    Findings: No erythema or rash.     Nails: There is no clubbing.   Neurological:     Mental Status: She is alert.     Diagnostics:    Results of blood tests obtained 08 July 2018 identified  creatinine 0.66 mg/DL, AST 20 3U/L, ALT 20 5U/L, WBC 10.8, absolute eosinophil 0, absolute lymphocyte 1400, TSH 0.746 IU/mL, free T4 1.20 NG/DL, thyroid peroxidase antibody 7 IU/mL, no IgE specific antibodies directed against either shellfish or mammal or alpha gal.  Assessment and Plan:   1. LPRD (laryngopharyngeal reflux disease)   2. Allergic urticaria   3. Other allergic rhinitis     1.  Continue to Treat and prevent immune system overactivity:   A.  Montelukast 10  mg - 1 tablet daily  B.  Cetirizine 10 mg - 1 tablet daily  2.  Continue to Treat and prevent reflux:   A.  Eliminate all consumption of caffeine and chocolate  B.  Pantoprazole 40 mg - 1 tablet in a.m.  C.  Famotidine 40 mg - 1 tablet in p.m.  3.  Continue losartan 50 mg daily.  See Primary MD about BP  4.  Return to clinic in 8 weeks or earlier if problem  5. Obtain fall flu vaccine (and COVID vaccine)  Alexandra Gonzales has had a very good response to medical treatment directed against immunological hyperreactivity and LPR and elimination of her ACE inhibitor.  I am going to continue to have her use the plan noted above for full 12 weeks.  She has already completed 4 weeks of treatment and thus I will see her back as in this clinic in 8 weeks.  I would like for her to revisit with her primary doctor about her blood pressure issue.  Allena Katz, MD Allergy / Immunology [place]

## 2018-08-07 NOTE — Patient Instructions (Addendum)
  1.  Continue to Treat and prevent immune system overactivity:   A.  Montelukast 10 mg - 1 tablet daily  B.  Cetirizine 10 mg - 1 tablet daily  2.  Continue to Treat and prevent reflux:   A.  Eliminate all consumption of caffeine and chocolate  B.  Pantoprazole 40 mg - 1 tablet in a.m.  C.  Famotidine 40 mg - 1 tablet in p.m.  3.  Continue losartan 50 mg daily.  See Primary MD about BP  4.  Return to clinic in 8 weeks or earlier if problem  5. Obtain fall flu vaccine (and COVID vaccine)

## 2018-08-07 NOTE — Telephone Encounter (Addendum)
Pt is supposed to call us back with the correct pharmacy.  She states that if we send it to this particular company if it is a level 1 or 2 preferred she will get the medication for little or no cost.    She needs refills on:    Famotidine Montelukast  Waiting for her call back to see what the correct pharmacy is.

## 2018-08-08 ENCOUNTER — Ambulatory Visit
Admission: RE | Admit: 2018-08-08 | Discharge: 2018-08-08 | Disposition: A | Discharge status: Home / Self Care | Payer: Medicare Other | Source: Ambulatory Visit | Attending: Family Medicine | Admitting: Family Medicine

## 2018-08-08 ENCOUNTER — Other Ambulatory Visit: Payer: Self-pay | Admitting: Family Medicine

## 2018-08-08 ENCOUNTER — Encounter: Payer: Self-pay | Admitting: Allergy and Immunology

## 2018-08-08 DIAGNOSIS — M25572 Pain in left ankle and joints of left foot: Secondary | ICD-10-CM

## 2018-08-08 MED ORDER — FAMOTIDINE 40 MG PO TABS: 40.0000 mg | ORAL_TABLET | Freq: Every day | ORAL | 3 refills | Status: DC

## 2018-08-08 MED ORDER — CETIRIZINE HCL 10 MG PO TABS: 10.0000 mg | ORAL_TABLET | Freq: Every day | ORAL | 3 refills | Status: DC

## 2018-08-08 MED ORDER — PANTOPRAZOLE SODIUM 40 MG PO TBEC: 40.0000 mg | DELAYED_RELEASE_TABLET | Freq: Every day | ORAL | 3 refills | Status: DC

## 2018-08-08 MED ORDER — MONTELUKAST SODIUM 10 MG PO TABS: 10.0000 mg | ORAL_TABLET | Freq: Every day | ORAL | 3 refills | Status: DC

## 2018-08-08 NOTE — Telephone Encounter (Signed)
Call to patient, let her know that medications were sent in to [facility].  Pt will call [facility] to see how much medications will cost her.

## 2018-08-08 NOTE — Telephone Encounter (Signed)
Patient called back with Pharmacy.  [facility] [address] [place]  [phone]) [phone]).   Patient said we need to call the pharmacy and see what tier the medications are in. Tier 1 and tier 2 are free for her. If it's more than tier 1 or 2, let patient know how much it'll cost. Please call patient when this has been done.

## 2018-08-17 ENCOUNTER — Ambulatory Visit
Admission: RE | Admit: 2018-08-17 | Discharge: 2018-08-17 | Disposition: A | Discharge status: Home / Self Care | Payer: Medicare Other | Source: Ambulatory Visit | Attending: Family Medicine | Admitting: Family Medicine

## 2018-08-17 ENCOUNTER — Other Ambulatory Visit: Payer: Self-pay

## 2018-08-17 DIAGNOSIS — Z1231 Encounter for screening mammogram for malignant neoplasm of breast: Secondary | ICD-10-CM

## 2018-09-12 ENCOUNTER — Other Ambulatory Visit: Payer: Self-pay | Admitting: *Deleted

## 2018-09-12 MED ORDER — LOSARTAN POTASSIUM 50 MG PO TABS: 50.0000 mg | ORAL_TABLET | Freq: Every day | ORAL | 1 refills | Status: DC

## 2018-09-25 ENCOUNTER — Encounter: Payer: Self-pay | Admitting: Allergy and Immunology

## 2018-09-25 ENCOUNTER — Ambulatory Visit (INDEPENDENT_AMBULATORY_CARE_PROVIDER_SITE_OTHER): Payer: Medicare Other | Admitting: Allergy and Immunology

## 2018-09-25 ENCOUNTER — Other Ambulatory Visit: Payer: Self-pay

## 2018-09-25 VITALS — BP 122/70 | HR 63 | Temp 98.0°F | Resp 16

## 2018-09-25 DIAGNOSIS — J3089 Other allergic rhinitis: Secondary | ICD-10-CM

## 2018-09-25 DIAGNOSIS — L500 Allergic urticaria: Secondary | ICD-10-CM

## 2018-09-25 DIAGNOSIS — K219 Gastro-esophageal reflux disease without esophagitis: Secondary | ICD-10-CM | POA: Diagnosis not present

## 2018-09-25 NOTE — Progress Notes (Signed)
[place] [facility]   Follow-up Note  Referring Provider: Shirline Frees, MD Primary Provider: Shirline Frees, MD Date of Office Visit: 09/25/2018  Subjective:   Alexandra Gonzales (DOB: 1940-12-03) is a 78 y.o. female who returns to the [facility] on 09/25/2018 in re-evaluation of the following:  HPI: Alexandra Gonzales returns to this clinic in reevaluation of allergic urticaria, allergic rhinitis, and LPR.  Her last visit to this clinic was 07 August 2018.  She has continued to do very well regarding her pruritic disorder and hives and has not had any significant outbreak other than a very small itchy episode that lasted a few hours.  She has not had any issues with her nose.  She has had no issues with reflux and she has had no issues with her throat.  She continues on a collection of montelukast, cetirizine, and treats her reflux with a combination of pantoprazole and famotidine.  Allergies as of 09/25/2018      Reactions   Morphine And Related Other (See Comments)   hallucinations   Codeine Nausea And Vomiting   Dexamethasone Nausea Only   Erythromycin Rash   Hydrocodone Nausea Only   Lyrica [pregabalin] Nausea And Vomiting   Prevacid [lansoprazole] Photosensitivity   Polytrim [polymyxin B-trimethoprim] Other (See Comments)   Sores in mouth, decreases memory   Penicillins Rash      Medication List      amLODipine 5 MG tablet Commonly known as: NORVASC   cetirizine 10 MG tablet Commonly known as: ZYRTEC Take 1 tablet (10 mg total) by mouth daily.   cyclobenzaprine 5 MG tablet Commonly known as: FLEXERIL cyclobenzaprine 5 mg tablet   famotidine 40 MG tablet Commonly known as: PEPCID Take 1 tablet (40 mg total) by mouth at bedtime.   loratadine 10 MG tablet Commonly known as: CLARITIN Take 10 mg by mouth daily.   losartan 50 MG tablet Commonly known as: COZAAR Take 1 tablet (50 mg total) by mouth daily.   montelukast 10  MG tablet Commonly known as: SINGULAIR Take 1 tablet (10 mg total) by mouth at bedtime.   Myrbetriq 25 MG Tb24 tablet Generic drug: mirabegron ER Take 25 mg by mouth daily.   pantoprazole 40 MG tablet Commonly known as: PROTONIX Take 1 tablet (40 mg total) by mouth daily.   Probiotic-10 Caps Take by mouth.   promethazine 25 MG tablet Commonly known as: PHENERGAN Take 1 tablet (25 mg total) by mouth every 6 (six) hours as needed for nausea or vomiting.   tamsulosin 0.4 MG Caps capsule Commonly known as: FLOMAX Take 1 capsule (0.4 mg total) by mouth daily.   traMADol 50 MG tablet Commonly known as: ULTRAM Take 1 tablet (50 mg total) by mouth every 6 (six) hours as needed.       Past Medical History:  Diagnosis Date  . Allergy   . Anxiety   . Breast cancer ([place])   . Cancer ([place])    questionable cancer to nipple  . GERD (gastroesophageal reflux disease)   . Hyperlipidemia   . Hypertension   . IBS (irritable bowel syndrome)   . Insomnia   . Kidney stones   . Osteopenia   . Personal history of radiation therapy     Past Surgical History:  Procedure Laterality Date  . ABDOMINAL HYSTERECTOMY    . BREAST EXCISIONAL BIOPSY    . BREAST LUMPECTOMY Left    march 2000  . BREAST  SURGERY     R lumpectomy 2000  . EXPLORATORY LAPAROTOMY    . EYE SURGERY     bilat cataract removal  . MOUTH SURGERY    . RADIOACTIVE SEED GUIDED EXCISIONAL BREAST BIOPSY Left 09/26/2016   Procedure: LEFT RADIOACTIVE SEED GUIDED EXCISIONAL BREAST BIOPSY ERAS PATHWAY;  Surgeon: Rolm Bookbinder, MD;  Location: [facility];  Service: General;  Laterality: Left;  LMA    Review of systems negative except as noted in HPI / PMHx or noted below:  Review of Systems  Constitutional: Negative.   HENT: Negative.   Eyes: Negative.   Respiratory: Negative.   Cardiovascular: Negative.   Gastrointestinal: Negative.   Genitourinary: Negative.   Musculoskeletal: Negative.   Skin:  Negative.   Neurological: Negative.   Endo/Heme/Allergies: Negative.   Psychiatric/Behavioral: Negative.      Objective:   Vitals:   09/25/18 1135  BP: 122/70  Pulse: 63  Resp: 16  Temp: 98 F (36.7 C)  SpO2: 96%          Physical Exam Constitutional:      Appearance: She is not diaphoretic.  HENT:     Head: Normocephalic.     Right Ear: Tympanic membrane, ear canal and external ear normal.     Left Ear: Tympanic membrane, ear canal and external ear normal.     Nose: Nose normal. No mucosal edema or rhinorrhea.     Mouth/Throat:     Pharynx: Uvula midline. No oropharyngeal exudate.  Eyes:     Conjunctiva/sclera: Conjunctivae normal.  Neck:     Thyroid: No thyromegaly.     Trachea: Trachea normal. No tracheal tenderness or tracheal deviation.  Cardiovascular:     Rate and Rhythm: Normal rate and regular rhythm.     Heart sounds: Normal heart sounds, S1 normal and S2 normal. No murmur.  Pulmonary:     Effort: No respiratory distress.     Breath sounds: Normal breath sounds. No stridor. No wheezing or rales.  Lymphadenopathy:     Head:     Right side of head: No tonsillar adenopathy.     Left side of head: No tonsillar adenopathy.     Cervical: No cervical adenopathy.  Skin:    Findings: No erythema or rash.     Nails: There is no clubbing.   Neurological:     Mental Status: She is alert.     Diagnostics: none  Assessment and Plan:   1. Allergic urticaria   2. Other allergic rhinitis   3. LPRD (laryngopharyngeal reflux disease)     1.  Continue to Treat and prevent immune system overactivity:   A.  Montelukast 10 mg - 1 tablet daily  B.  Cetirizine 10 mg - 1 tablet daily  2.  Continue to Treat and prevent reflux:   A.  Eliminate all consumption of caffeine and chocolate  B.  Pantoprazole 40 mg - 1 tablet in a.m.  C.  Can attempt to DISCONTINUE Famotidine 40 mg - 1 tablet in p.m.  3. Return to clinic in December 2020 or earlier if problem  4.  Obtain fall flu vaccine (and COVID vaccine)  Alexandra Gonzales really appears to be doing very well with her current medical plan directed against immune system overactivity and reflux.  She can now attempt to consolidate her treatment by discontinuing her famotidine.  Assuming she continues to do well I will see her back in this clinic in late December 2020 and there may be an  opportunity to further consolidate her treatment with that visit.   Allena Katz, MD Allergy / Immunology [place]

## 2018-09-25 NOTE — Patient Instructions (Addendum)
  1.  Continue to Treat and prevent immune system overactivity:   A.  Montelukast 10 mg - 1 tablet daily  B.  Cetirizine 10 mg - 1 tablet daily  2.  Continue to Treat and prevent reflux:   A.  Eliminate all consumption of caffeine and chocolate  B.  Pantoprazole 40 mg - 1 tablet in a.m.  C.  Can attempt to DISCONTINUE Famotidine 40 mg - 1 tablet in p.m.  3. Return to clinic in December 2020 or earlier if problem  4. Obtain fall flu vaccine (and COVID vaccine)

## 2018-09-26 ENCOUNTER — Encounter: Payer: Self-pay | Admitting: Allergy and Immunology

## 2018-10-05 ENCOUNTER — Ambulatory Visit (INDEPENDENT_AMBULATORY_CARE_PROVIDER_SITE_OTHER): Payer: Medicare Other | Admitting: Obstetrics and Gynecology

## 2018-10-05 ENCOUNTER — Other Ambulatory Visit: Payer: Self-pay

## 2018-10-05 ENCOUNTER — Encounter: Payer: Self-pay | Admitting: Obstetrics and Gynecology

## 2018-10-05 VITALS — BP 128/80 | HR 68 | Temp 97.7°F | Resp 12 | Ht 61.25 in | Wt 145.0 lb

## 2018-10-05 DIAGNOSIS — N8110 Cystocele, unspecified: Secondary | ICD-10-CM

## 2018-10-05 DIAGNOSIS — N816 Rectocele: Secondary | ICD-10-CM | POA: Diagnosis not present

## 2018-10-05 NOTE — Progress Notes (Signed)
GYNECOLOGY  VISIT   HPI: 78 y.o.   Widowed  Caucasian  female   G2P2001 with No LMP recorded (lmp unknown). Patient has had a hysterectomy.   here for bladder and pelvic pain. Patient has seen [facility] and would like a second opinion. Daughter is a current patient here and recommended our office. Patient referred from PCP  Dr. Shirline Frees. Per patient, had Urinalysis with Urologist two days ago.  Hx renal stones. She had pelvic pain with mowing her yard recently, and this prompted a series of evaluations and consultations.  She went to the ER and had  CT scan showing left sided hydroureteronephrosis and a stone at the left UVJ.  She saw her urologist in follow up, and she was told she passed the stone.  Ultimately she ended up in the hands of a physical therapist at her urology office. She had various sessions for improved bladder control.  Patient is concerned she has a dropped bladder and has not had any confirmation of this to date. She feels pressure, especially when she is drinking water.  No urinary leakage.  She has internal vaginal pain when she coughs.  She can leak a drip of urine when she gets up to void.  Denies dysuria.  She can have fecal incontinence and difficulty passing stool. She does vaginal and perianal splinting, once every 3 weeks.  She has less pressure sensation when she has a bowel movement.   Patient has an anal fissure.   Not sexually active.   She states she had an abdominal hysterectomy with right oophorectomy and bladder tack with Dr. Idolina Primer years ago.  She reports her left ovary remains.   GYNECOLOGIC HISTORY: No LMP recorded (lmp unknown). Patient has had a hysterectomy. Contraception:  Hysterectomy Menopausal hormone therapy:  none Last mammogram:  08/17/18 BIRADS 1 negative/density b Last pap smear:   Years ago        OB History    Gravida  2   Para  2   Term  2   Preterm  0   AB  0   Living  1     SAB  0   TAB  0    Ectopic  0   Multiple  0   Live Births  1              Patient Active Problem List   Diagnosis Date Noted  . Facial spasm 02/29/2016  . Esophageal reflux 02/18/2016  . Neuropathy 02/18/2016    Past Medical History:  Diagnosis Date  . Allergy   . Anxiety   . Bilateral carotid artery stenosis   . Breast cancer ([place])   . Bronchitis   . Cancer ([place])    questionable cancer to nipple  . Chronic pain   . Colon spasm   . Diverticulitis   . Dysphagia   . Functional gait abnormality   . GERD (gastroesophageal reflux disease)   . Hyperlipidemia   . Hyperlipidemia   . Hypertension   . IBS (irritable bowel syndrome)   . Insomnia   . Kidney stones   . Low back pain   . Lumbar radiculopathy   . Myalgia   . Neuropathy   . Osteopenia   . Personal history of radiation therapy   . Sciatica   . Sleep disorder     Past Surgical History:  Procedure Laterality Date  . ABDOMINAL HYSTERECTOMY    . BREAST EXCISIONAL BIOPSY    .  BREAST LUMPECTOMY Left    march 2000  . BREAST SURGERY     R lumpectomy 2000  . CATARACT EXTRACTION, BILATERAL    . EXPLORATORY LAPAROTOMY    . EXPLORATORY LAPAROTOMY    . EYE SURGERY     bilat cataract removal  . MOUTH SURGERY    . RADIOACTIVE SEED GUIDED EXCISIONAL BREAST BIOPSY Left 09/26/2016   Procedure: LEFT RADIOACTIVE SEED GUIDED EXCISIONAL BREAST BIOPSY ERAS PATHWAY;  Surgeon: Rolm Bookbinder, MD;  Location: [facility];  Service: General;  Laterality: Left;  LMA    Current Outpatient Medications  Medication Sig Dispense Refill  . amLODipine (NORVASC) 5 MG tablet     . cetirizine (ZYRTEC) 10 MG tablet Take 1 tablet (10 mg total) by mouth daily. 90 tablet 3  . cyclobenzaprine (FLEXERIL) 5 MG tablet cyclobenzaprine 5 mg tablet    . loratadine (CLARITIN) 10 MG tablet Take 10 mg by mouth daily.    Marland Kitchen losartan (COZAAR) 50 MG tablet Take 1 tablet (50 mg total) by mouth daily. 90 tablet 1  . Melatonin 10 MG TABS Take by  mouth.    . montelukast (SINGULAIR) 10 MG tablet Take 1 tablet (10 mg total) by mouth at bedtime. 90 tablet 3  . pantoprazole (PROTONIX) 40 MG tablet Take 1 tablet (40 mg total) by mouth daily. 90 tablet 3  . Probiotic Product (PROBIOTIC-10) CAPS Take by mouth.    . mirabegron ER (MYRBETRIQ) 25 MG TB24 tablet Take 25 mg by mouth daily.     No current facility-administered medications for this visit.      ALLERGIES: Morphine and related, Codeine, Dexamethasone, Erythromycin, Hydrocodone, Lyrica [pregabalin], Prevacid [lansoprazole], Clarithromycin, Famotidine, Polytrim [polymyxin b-trimethoprim], Zantac [ranitidine hcl], and Penicillins  Family History  Problem Relation Age of Onset  . Congestive Heart Failure Mother        Died 74  . Kidney Stones Father        Died 48 - complications from kidney stone  . Breast cancer Sister 59  . Leukemia Maternal Uncle   . Colon cancer Maternal Uncle     Social History   Socioeconomic History  . Marital status: Widowed    Spouse name: Not on file  . Number of children: 2  . Years of education: 10th  . Highest education level: Not on file  Occupational History  . Occupation: Retired  Scientific laboratory technician  . Financial resource strain: Not on file  . Food insecurity    Worry: Not on file    Inability: Not on file  . Transportation needs    Medical: Not on file    Non-medical: Not on file  Tobacco Use  . Smoking status: Never Smoker  . Smokeless tobacco: Never Used  Substance and Sexual Activity  . Alcohol use: No  . Drug use: No  . Sexual activity: Not Currently    Birth control/protection: Surgical, Post-menopausal    Comment: Hysterectomy  Lifestyle  . Physical activity    Days per week: Not on file    Minutes per session: Not on file  . Stress: Not on file  Relationships  . Social Herbalist on phone: Not on file    Gets together: Not on file    Attends religious service: Not on file    Active member of club or  organization: Not on file    Attends meetings of clubs or organizations: Not on file    Relationship status: Not on file  .  Intimate partner violence    Fear of current or ex partner: Not on file    Emotionally abused: Not on file    Physically abused: Not on file    Forced sexual activity: Not on file  Other Topics Concern  . Not on file  Social History Narrative   Lives at home alone.   Right-handed.   Occasional caffeine use.    Review of Systems  Constitutional: Negative.   HENT: Negative.   Eyes: Negative.   Respiratory: Negative.   Cardiovascular: Negative.   Gastrointestinal: Negative.   Endocrine: Negative.   Genitourinary: Negative.   Musculoskeletal: Negative.   Skin: Negative.   Allergic/Immunologic: Negative.   Neurological: Negative.   Hematological: Negative.   Psychiatric/Behavioral: Negative.     PHYSICAL EXAMINATION:    BP 128/80 (BP Location: Left Arm, Patient Position: Sitting, Cuff Size: Normal)   Pulse 68   Temp 97.7 F (36.5 C) (Temporal)   Resp 12   Ht 5' 1.25" (1.556 m)   Wt 145 lb (65.8 kg)   LMP  (LMP Unknown)   BMI 27.17 kg/m     General appearance: alert, cooperative and appears stated age Head: Normocephalic, without obvious abnormality, atraumatic Lungs: clear to auscultation bilaterally Heart: regular rate and rhythm Abdomen: vertical midline incision with a depression in the inferior part of the incision.  Abdomen is soft, non-tender, no masses,  no organomegaly Extremities: extremities normal, atraumatic, no cyanosis or edema Skin: Skin color, texture, turgor normal. No rashes or lesions No abnormal inguinal nodes palpated Neurologic: Grossly normal  Pelvic: External genitalia:  no lesions              Urethra:  normal appearing urethra with no masses, tenderness or lesions              Bartholins and Skenes: normal                 Vagina: normal appearing vagina with normal color and discharge, no lesions.. Second degree  cystocele, first degree rectocele.  Good apical support.  Good urethral support.               Cervix: Absent.                Bimanual Exam:  Uterus:  Absent.               Adnexa: no mass, fullness, tenderness              Rectal exam: Yes.  .  Confirms.              Anus:  normal sphincter tone, hemorrhoid.   Chaperone was present for exam.  ASSESSMENT  Status post TAH/right oophorectomy/bladder tack in 1964 or 1965.  Still has her left ovary.  I suspect she had a Burch or MMK procedure and a suprapubic catheter. Cystocele, rectocele.  Hx IBS and anal fissure. Myrbetriq use. Hx breast cancer.  Reaction to anesthesia per patient.   PLAN  We had a comprehensive discussion of pelvic organ prolapse - etiologies, signs and symptoms, life history, and different kinds of bladder prolapse surgery. We reviewed options for care in detail including observation, physical therapy, pessary use and vaginal surgical correction.  We did discuss the risks and benefits of surgery - bleeding, infection, damage to surrounding organs, reaction to anesthesia, DVT, PE, death, need for reoperation, recurrence of prolapse. For now she will continue PT, and she will consider her options.  I did give  her ACOG HOs on pelvic organ prolapse and surgery for these conditions.    An After Visit Summary was printed and given to the patient.  __45____ minutes face to face time of which over 50% was spent in counseling.

## 2018-10-11 ENCOUNTER — Ambulatory Visit: Payer: Medicare Other | Admitting: Podiatry

## 2018-10-11 ENCOUNTER — Ambulatory Visit (INDEPENDENT_AMBULATORY_CARE_PROVIDER_SITE_OTHER): Payer: Medicare Other

## 2018-10-11 ENCOUNTER — Ambulatory Visit: Payer: Medicare Other

## 2018-10-11 ENCOUNTER — Other Ambulatory Visit: Payer: Self-pay

## 2018-10-11 DIAGNOSIS — M76821 Posterior tibial tendinitis, right leg: Secondary | ICD-10-CM

## 2018-10-11 DIAGNOSIS — M79671 Pain in right foot: Secondary | ICD-10-CM

## 2018-10-11 DIAGNOSIS — M79672 Pain in left foot: Secondary | ICD-10-CM

## 2018-10-11 DIAGNOSIS — M76822 Posterior tibial tendinitis, left leg: Secondary | ICD-10-CM

## 2018-10-11 DIAGNOSIS — M722 Plantar fascial fibromatosis: Secondary | ICD-10-CM | POA: Diagnosis not present

## 2018-10-11 NOTE — Progress Notes (Signed)
Subjective:  Patient ID: Alexandra Gonzales, female    DOB: 1940/05/19,  MRN: [id number]  Chief Complaint  Patient presents with  . Foot Pain    pt is here for foot pain in both of her feet, and possible ankles as well    78 y.o. female presents with the above complaint. States the inside of the ankle hurts most when walking. Pain into the heels as well.  Review of Systems: Negative except as noted in the HPI. Denies N/V/F/Ch.  Past Medical History:  Diagnosis Date  . Allergy   . Anxiety   . Bilateral carotid artery stenosis   . Breast cancer ([place])   . Bronchitis   . Cancer ([place])    questionable cancer to nipple  . Chronic pain   . Colon spasm   . Diverticulitis   . Dysphagia   . Functional gait abnormality   . GERD (gastroesophageal reflux disease)   . Hyperlipidemia   . Hyperlipidemia   . Hypertension   . IBS (irritable bowel syndrome)   . Insomnia   . Kidney stones   . Low back pain   . Lumbar radiculopathy   . Myalgia   . Neuropathy   . Osteopenia   . Personal history of radiation therapy   . Sciatica   . Sleep disorder     Current Outpatient Medications:  .  amLODipine (NORVASC) 5 MG tablet, , Disp: , Rfl:  .  cetirizine (ZYRTEC) 10 MG tablet, Take 1 tablet (10 mg total) by mouth daily., Disp: 90 tablet, Rfl: 3 .  cyclobenzaprine (FLEXERIL) 5 MG tablet, cyclobenzaprine 5 mg tablet, Disp: , Rfl:  .  loratadine (CLARITIN) 10 MG tablet, Take 10 mg by mouth daily., Disp: , Rfl:  .  losartan (COZAAR) 50 MG tablet, Take 1 tablet (50 mg total) by mouth daily., Disp: 90 tablet, Rfl: 1 .  Melatonin 10 MG TABS, Take by mouth., Disp: , Rfl:  .  mirabegron ER (MYRBETRIQ) 25 MG TB24 tablet, Take 25 mg by mouth daily., Disp: , Rfl:  .  montelukast (SINGULAIR) 10 MG tablet, Take 1 tablet (10 mg total) by mouth at bedtime., Disp: 90 tablet, Rfl: 3 .  pantoprazole (PROTONIX) 40 MG tablet, Take 1 tablet (40 mg total) by mouth daily., Disp: 90 tablet, Rfl: 3 .  Probiotic Product  (PROBIOTIC-10) CAPS, Take by mouth., Disp: , Rfl:   Social History   Tobacco Use  Smoking Status Never Smoker  Smokeless Tobacco Never Used    Allergies  Allergen Reactions  . Morphine And Related Other (See Comments)    hallucinations  . Codeine Nausea And Vomiting  . Dexamethasone Nausea Only  . Erythromycin Rash  . Hydrocodone Nausea Only  . Lyrica [Pregabalin] Nausea And Vomiting  . Prevacid [Lansoprazole] Photosensitivity  . Clarithromycin   . Famotidine     Affected memory  . Polytrim [Polymyxin B-Trimethoprim] Other (See Comments)    Sores in mouth, decreases memory  . Zantac [Ranitidine Hcl]     Affected memory  . Penicillins Rash   Objective:  There were no vitals filed for this visit. There is no height or weight on file to calculate BMI. Constitutional Well developed. Well nourished.  Vascular Dorsalis pedis pulses palpable bilaterally. Posterior tibial pulses palpable bilaterally. Capillary refill normal to all digits.  No cyanosis or clubbing noted. Pedal hair growth normal.  Neurologic Normal speech. Oriented to person, place, and time. Epicritic sensation to light touch grossly present bilaterally.  Dermatologic Nails well  groomed and normal in appearance. No open wounds. No skin lesions.  Orthopedic: POP PT tendon bilat, medial plantar fascia, navicular tuberosity bilat.   Radiographs: Taken and reviewed no acute fractures or dislocations. Assessment:   1. Plantar fasciitis   2. Posterior tibialis tendinitis of both lower extremities   3. Pain in both feet    Plan:  Patient was evaluated and treated and all questions answered.  Plantar fasciitis, PT tendonitis -Dispense PF brace x2 to rest the medial arch and reduce strain on the PT tendon. -Would benefit from orthotics at a later date  No follow-ups on file.

## 2018-10-11 NOTE — Patient Instructions (Signed)

## 2018-10-12 ENCOUNTER — Other Ambulatory Visit: Payer: Self-pay | Admitting: Podiatry

## 2018-10-12 DIAGNOSIS — M722 Plantar fascial fibromatosis: Secondary | ICD-10-CM

## 2018-10-12 DIAGNOSIS — M76821 Posterior tibial tendinitis, right leg: Secondary | ICD-10-CM

## 2018-10-12 DIAGNOSIS — M76822 Posterior tibial tendinitis, left leg: Secondary | ICD-10-CM

## 2018-11-07 ENCOUNTER — Other Ambulatory Visit: Payer: Self-pay | Admitting: *Deleted

## 2018-11-07 MED ORDER — CETIRIZINE HCL 10 MG PO TABS: 10.0000 mg | ORAL_TABLET | Freq: Every day | ORAL | 3 refills | Status: DC

## 2018-11-14 ENCOUNTER — Ambulatory Visit
Admission: RE | Admit: 2018-11-14 | Discharge: 2018-11-14 | Disposition: A | Discharge status: Home / Self Care | Payer: Medicare Other | Source: Ambulatory Visit | Attending: Family Medicine | Admitting: Family Medicine

## 2018-11-14 ENCOUNTER — Other Ambulatory Visit: Payer: Self-pay | Admitting: Family Medicine

## 2018-11-14 DIAGNOSIS — R200 Anesthesia of skin: Secondary | ICD-10-CM

## 2018-11-22 ENCOUNTER — Ambulatory Visit: Payer: Medicare Other | Admitting: Podiatry

## 2018-11-23 ENCOUNTER — Encounter: Payer: Self-pay | Admitting: Neurology

## 2018-11-28 ENCOUNTER — Institutional Professional Consult (permissible substitution): Payer: Medicare Other | Admitting: Neurology

## 2018-12-25 ENCOUNTER — Ambulatory Visit: Payer: Medicare Other | Admitting: Allergy and Immunology

## 2018-12-27 NOTE — Progress Notes (Addendum)
NEUROLOGY CONSULTATION NOTE  Alexandra Gonzales MRN: [id number] DOB: 12-07-1940  Referring provider: Shirline Frees, MD Primary care provider: Shirline Frees, MD  Reason for consult:  Facial numbness  HISTORY OF PRESENT ILLNESS: Alexandra Gonzales is a 78 year old female who presents for facial numbness.  History supplemented by Sports Medicine, prior neurologist, and referring provider notes.  She has a history of chronic neck pain off and on for several years.  In October, she developed right sharp and burning periorbital pain after taking prednisone for neck pain (no preceding injury or strenuous activity).  She then developed right facial numbness (like novocaine) which resolved but now has numbness and tingling in the right side of neck to the shoulder.  No rash, hearing changes or tinnitus.  No pain, numbness or weakness of the arm.  No jaw pain or headache.  Around that time, she reports some slurred speech and felt like she was leaning to the right.  CT head from 11/14/2018 personally reviewed showed chronic small vessel ischemic changes but no acute intracranial abnormality.  Once in awhile, she has some right eye pain.  11/13/2018 LABS:  TSH 0.35; Sed rate 2; B12 396; CBC with WBC 8.4, HGB 15.1, HCT 45.4, PLT 329; CMP with Na 141, K+ 4.9, Cl 101, CO2 30;  t bili 0.6, ALP 82, AST 16, ALT 18  She has been evaluated by neurology in 2018 for bilateral (left worse than right) facial twitching.    PAST MEDICAL HISTORY: Past Medical History:  Diagnosis Date  . Allergy   . Anxiety   . Bilateral carotid artery stenosis   . Breast cancer ([place])   . Bronchitis   . Cancer ([place])    questionable cancer to nipple  . Chronic pain   . Colon spasm   . Diverticulitis   . Dysphagia   . Functional gait abnormality   . GERD (gastroesophageal reflux disease)   . Hyperlipidemia   . Hyperlipidemia   . Hypertension   . IBS (irritable bowel syndrome)   . Insomnia   . Kidney stones   . Low back pain   .  Lumbar radiculopathy   . Myalgia   . Neuropathy   . Osteopenia   . Personal history of radiation therapy   . Sciatica   . Sleep disorder     PAST SURGICAL HISTORY: Past Surgical History:  Procedure Laterality Date  . ABDOMINAL HYSTERECTOMY    . BREAST EXCISIONAL BIOPSY    . BREAST LUMPECTOMY Left    march 2000  . BREAST SURGERY     R lumpectomy 2000  . CATARACT EXTRACTION, BILATERAL    . EXPLORATORY LAPAROTOMY    . EXPLORATORY LAPAROTOMY    . EYE SURGERY     bilat cataract removal  . MOUTH SURGERY    . RADIOACTIVE SEED GUIDED EXCISIONAL BREAST BIOPSY Left 09/26/2016   Procedure: LEFT RADIOACTIVE SEED GUIDED EXCISIONAL BREAST BIOPSY ERAS PATHWAY;  Surgeon: Rolm Bookbinder, MD;  Location: [facility];  Service: General;  Laterality: Left;  LMA    MEDICATIONS: Current Outpatient Medications on File Prior to Visit  Medication Sig Dispense Refill  . amLODipine (NORVASC) 5 MG tablet     . cetirizine (ZYRTEC) 10 MG tablet Take 1 tablet (10 mg total) by mouth daily. 90 tablet 3  . cyclobenzaprine (FLEXERIL) 5 MG tablet cyclobenzaprine 5 mg tablet    . loratadine (CLARITIN) 10 MG tablet Take 10 mg by mouth daily.    Marland Kitchen losartan (  COZAAR) 50 MG tablet Take 1 tablet (50 mg total) by mouth daily. 90 tablet 1  . Melatonin 10 MG TABS Take by mouth.    . mirabegron ER (MYRBETRIQ) 25 MG TB24 tablet Take 25 mg by mouth daily.    . montelukast (SINGULAIR) 10 MG tablet Take 1 tablet (10 mg total) by mouth at bedtime. 90 tablet 3  . pantoprazole (PROTONIX) 40 MG tablet Take 1 tablet (40 mg total) by mouth daily. 90 tablet 3  . Probiotic Product (PROBIOTIC-10) CAPS Take by mouth.     No current facility-administered medications on file prior to visit.    ALLERGIES: Allergies  Allergen Reactions  . Morphine And Related Other (See Comments)    hallucinations  . Codeine Nausea And Vomiting  . Dexamethasone Nausea Only  . Erythromycin Rash  . Hydrocodone Nausea Only  .  Lyrica [Pregabalin] Nausea And Vomiting  . Prevacid [Lansoprazole] Photosensitivity  . Clarithromycin   . Famotidine     Affected memory  . Polytrim [Polymyxin B-Trimethoprim] Other (See Comments)    Sores in mouth, decreases memory  . Zantac [Ranitidine Hcl]     Affected memory  . Penicillins Rash    FAMILY HISTORY: Family History  Problem Relation Age of Onset  . Congestive Heart Failure Mother        Died 74  . Kidney Stones Father        Died 48 - complications from kidney stone  . Breast cancer Sister 59  . Leukemia Maternal Uncle   . Colon cancer Maternal Uncle    SOCIAL HISTORY: Social History   Socioeconomic History  . Marital status: Widowed    Spouse name: Not on file  . Number of children: 2  . Years of education: 10th  . Highest education level: Not on file  Occupational History  . Occupation: Retired  Tobacco Use  . Smoking status: Never Smoker  . Smokeless tobacco: Never Used  Substance and Sexual Activity  . Alcohol use: No  . Drug use: No  . Sexual activity: Not Currently    Birth control/protection: Surgical, Post-menopausal    Comment: Hysterectomy  Other Topics Concern  . Not on file  Social History Narrative   Lives at home alone.   Right-handed.   Occasional caffeine use.   Social Determinants of Health   Financial Resource Strain:   . Difficulty of Paying Living Expenses: Not on file  Food Insecurity:   . Worried About Charity fundraiser in the Last Year: Not on file  . Ran Out of Food in the Last Year: Not on file  Transportation Needs:   . Lack of Transportation (Medical): Not on file  . Lack of Transportation (Non-Medical): Not on file  Physical Activity:   . Days of Exercise per Week: Not on file  . Minutes of Exercise per Session: Not on file  Stress:   . Feeling of Stress : Not on file  Social Connections:   . Frequency of Communication with Friends and Family: Not on file  . Frequency of Social Gatherings with Friends  and Family: Not on file  . Attends Religious Services: Not on file  . Active Member of Clubs or Organizations: Not on file  . Attends Archivist Meetings: Not on file  . Marital Status: Not on file  Intimate Partner Violence:   . Fear of Current or Ex-Partner: Not on file  . Emotionally Abused: Not on file  . Physically Abused: Not on file  .  Sexually Abused: Not on file    REVIEW OF SYSTEMS: Constitutional: No fevers, chills, or sweats, no generalized fatigue, change in appetite Eyes: No visual changes, double vision, eye pain Ear, nose and throat: No hearing loss, ear pain, nasal congestion, sore throat Cardiovascular: No chest pain, palpitations Respiratory:  No shortness of breath at rest or with exertion, wheezes GastrointestinaI: No nausea, vomiting, diarrhea, abdominal pain, fecal incontinence Genitourinary:  No dysuria, urinary retention or frequency Musculoskeletal:  No neck pain, back pain Integumentary: No rash, pruritus, skin lesions Neurological: as above Psychiatric: No depression, insomnia, anxiety Endocrine: No palpitations, fatigue, diaphoresis, mood swings, change in appetite, change in weight, increased thirst Hematologic/Lymphatic:  No purpura, petechiae. Allergic/Immunologic: no itchy/runny eyes, nasal congestion, recent allergic reactions, rashes  PHYSICAL EXAM: Blood pressure (!) 148/72, pulse 72, height 5\' 3"  (1.6 m), weight 146 lb (66.2 kg), SpO2 98 %. General: No acute distress.  Patient appears well-groomed.   Head:  Normocephalic/atraumatic.  No temporal pain to palpation.  No TMJ tenderness. Eyes:  fundi examined but not visualized Neck: supple, no paraspinal tenderness, full range of motion Back: No paraspinal tenderness Heart: regular rate and rhythm Lungs: Clear to auscultation bilaterally. Vascular: No carotid bruits. Neurological Exam: Mental status: alert and oriented to person, place, and time, delayed recall 2 out of 3 words;  remote memory intact, fund of knowledge intact, attention and concentration intact, speech fluent and not dysarthric, language intact. Cranial nerves: CN I: not tested CN II: pupils equal, round and reactive to light, visual fields intact CN III, IV, VI:  full range of motion, no nystagmus, no ptosis CN V: facial sensation intact CN VII: upper and lower face symmetric CN VIII: hearing intact CN IX, X: gag intact, uvula midline CN XI: sternocleidomastoid and trapezius muscles intact CN XII: tongue midline Bulk & Tone: normal, no fasciculations. Motor:  5/5 throughout  Sensation:  Decreased pinprick sensation on right side of neck to right deltoid; and vibration sensation intact. Deep Tendon Reflexes:  2+ throughout, toes downgoing.   Finger to nose testing:  Without dysmetria.   Heel to shin:  Without dysmetria.   Gait:  Normal station and stride.  Able to turn; tandem walk deferred.  Romberg with mild sway.  IMPRESSION: 1.  Right sided facial and neck numbness 2.  Right sided neck pain 3.  Right eye pain 4.  Episode of slurred speech and unsteadiness  Unclear etiology.  No specific trauma.  Do not suspect temporal arteritis or TMJ dysfunction (no tenderness to palpation).  I would want to rule out an intracranial abnormality or cervical spine abnormality.  I would also want to evaluate for an ischemic event or vascular etiology such as carotid artery dissection or stenosis.  PLAN: 1.  MRI of brain and cervical spine.  She has significant claustrophobia, so we will try to order with sedation. 2.  CTA of head and neck 3.  Follow up after testing.  Thank you for allowing me to take part in the care of this patient.  Metta Clines, DO  CC: Shirline Frees, MD

## 2018-12-31 ENCOUNTER — Telehealth: Payer: Self-pay

## 2018-12-31 ENCOUNTER — Ambulatory Visit: Payer: Medicare Other | Admitting: Neurology

## 2018-12-31 ENCOUNTER — Encounter: Payer: Self-pay | Admitting: Neurology

## 2018-12-31 ENCOUNTER — Other Ambulatory Visit: Payer: Self-pay

## 2018-12-31 VITALS — BP 148/72 | HR 72 | Ht 63.0 in | Wt 146.0 lb

## 2018-12-31 DIAGNOSIS — H5711 Ocular pain, right eye: Secondary | ICD-10-CM | POA: Diagnosis not present

## 2018-12-31 DIAGNOSIS — R200 Anesthesia of skin: Secondary | ICD-10-CM | POA: Diagnosis not present

## 2018-12-31 DIAGNOSIS — M542 Cervicalgia: Secondary | ICD-10-CM

## 2018-12-31 NOTE — Telephone Encounter (Signed)
She will do CT, you want ct of brain and CT of cervical spine with and without correct?

## 2018-12-31 NOTE — Telephone Encounter (Signed)
MRI of brain and cervical spine cant be done at [facility] per them, with sedation. Can we done with oral meds. Please readvise, they suggest [facility]

## 2018-12-31 NOTE — Telephone Encounter (Signed)
Per Daughter patient cant do Open Mri, she refuses, please readvise

## 2018-12-31 NOTE — Telephone Encounter (Signed)
Then we cannot do MRI.  We can check CT of cervical spine with and without contrast.

## 2018-12-31 NOTE — Telephone Encounter (Signed)
Has she ever been to the open MRI at [facility]?  I would see if the patient is willing to go to the open MRI at [facility].  It is like a CT scan.  She should still take one of her Valiums about 30 to 40 minutes prior to the scan and will need a driver.

## 2018-12-31 NOTE — Patient Instructions (Addendum)
1.  Will order MRI of brain and cervical spine without contrast under sedation 2.  Will order CTA of neck 3.  Follow up after testing.  [facility] will contact you regarding MRI of brain and cervical spine. [facility] will contact you regarding CTA of neck and Brain

## 2019-01-01 ENCOUNTER — Other Ambulatory Visit: Payer: Self-pay

## 2019-01-01 DIAGNOSIS — G5139 Clonic hemifacial spasm, unspecified: Secondary | ICD-10-CM

## 2019-01-01 DIAGNOSIS — H5711 Ocular pain, right eye: Secondary | ICD-10-CM

## 2019-01-01 DIAGNOSIS — M542 Cervicalgia: Secondary | ICD-10-CM

## 2019-01-01 DIAGNOSIS — R200 Anesthesia of skin: Secondary | ICD-10-CM

## 2019-01-01 NOTE — Telephone Encounter (Signed)
Orders placed at [facility] for all

## 2019-01-01 NOTE — Telephone Encounter (Signed)
CT cervical spine with and without contrast as well as CTA of head and neck

## 2019-01-09 ENCOUNTER — Ambulatory Visit
Admission: RE | Admit: 2019-01-09 | Discharge: 2019-01-09 | Disposition: A | Discharge status: Home / Self Care | Payer: Medicare Other | Source: Ambulatory Visit | Attending: Neurology | Admitting: Neurology

## 2019-01-09 ENCOUNTER — Other Ambulatory Visit: Payer: Medicare Other

## 2019-01-09 ENCOUNTER — Other Ambulatory Visit: Payer: Self-pay | Admitting: Allergy and Immunology

## 2019-01-09 ENCOUNTER — Other Ambulatory Visit: Payer: Self-pay

## 2019-01-09 DIAGNOSIS — H5711 Ocular pain, right eye: Secondary | ICD-10-CM

## 2019-01-09 DIAGNOSIS — R200 Anesthesia of skin: Secondary | ICD-10-CM

## 2019-01-09 DIAGNOSIS — M542 Cervicalgia: Secondary | ICD-10-CM

## 2019-01-09 MED ORDER — IOPAMIDOL (ISOVUE-370) INJECTION 76%
75.0000 mL | Freq: Once | INTRAVENOUS | Status: AC | PRN
  Administered 2019-01-09: 75 mL via INTRAVENOUS

## 2019-01-10 ENCOUNTER — Telehealth: Payer: Self-pay

## 2019-01-10 DIAGNOSIS — E041 Nontoxic single thyroid nodule: Secondary | ICD-10-CM

## 2019-01-10 DIAGNOSIS — M542 Cervicalgia: Secondary | ICD-10-CM

## 2019-01-10 NOTE — Telephone Encounter (Signed)
Informed patient of results and recommendations.  She is willing to do the ultrasound of the thyroid.  Order placed.  She will wait for further direction from [facility] on MRI.  She says she has tried medication prior to imaging and it didn't seem to work until.

## 2019-01-10 NOTE — Telephone Encounter (Signed)
-----   Message from Alda Berthold, DO sent at 01/10/2019  9:35 AM EST ----- Please inform patient that CT head shows mild age-related changes, nothing new to explain her symptoms.  Her blood vessels look good, with only minimal narrowing.    On her neck angiogram, there is note of (1) thyroid nodule and if she has not had thyroid ultrasound, this is recommended and (2) there are boney changes in the spine which may be arthritic or inflammation - to further evaluate this, MRI is recommended. This was initially suggested by Dr. Tomi Likens, but patient reported severe claustrophobia. Dr. Tomi Likens will return to the office next week and decide if imaging under sedation is the next step, especially if she was unable to have it performed at [facility] ([facility]).

## 2019-01-17 ENCOUNTER — Telehealth: Payer: Self-pay | Admitting: Neurology

## 2019-01-17 NOTE — Telephone Encounter (Signed)
Patient called wanting to know if her Ultrasound would show her teeth?? Or around her Ear? She said that's where her pain is. She also wanted to know if her results would go to her PCP? Please Call. Thank you

## 2019-01-17 NOTE — Telephone Encounter (Signed)
No answer at 324, ultrasound will not show her teeth, fyi

## 2019-01-22 ENCOUNTER — Ambulatory Visit
Admission: RE | Admit: 2019-01-22 | Discharge: 2019-01-22 | Disposition: A | Discharge status: Home / Self Care | Payer: Medicare Other | Source: Ambulatory Visit | Attending: Neurology | Admitting: Neurology

## 2019-01-22 DIAGNOSIS — M542 Cervicalgia: Secondary | ICD-10-CM

## 2019-01-22 DIAGNOSIS — E041 Nontoxic single thyroid nodule: Secondary | ICD-10-CM

## 2019-01-23 NOTE — Telephone Encounter (Signed)
Patient returned call to [name]. 

## 2019-01-24 ENCOUNTER — Other Ambulatory Visit: Payer: Self-pay

## 2019-01-25 ENCOUNTER — Other Ambulatory Visit: Payer: Self-pay | Admitting: Allergy and Immunology

## 2019-01-30 ENCOUNTER — Other Ambulatory Visit: Payer: Self-pay | Admitting: Family Medicine

## 2019-01-30 DIAGNOSIS — E041 Nontoxic single thyroid nodule: Secondary | ICD-10-CM

## 2019-02-10 ENCOUNTER — Other Ambulatory Visit: Payer: Self-pay | Admitting: Allergy and Immunology

## 2019-02-13 ENCOUNTER — Ambulatory Visit
Admission: RE | Admit: 2019-02-13 | Discharge: 2019-02-13 | Disposition: A | Discharge status: Home / Self Care | Payer: Medicare Other | Source: Ambulatory Visit | Attending: Family Medicine | Admitting: Family Medicine

## 2019-02-13 ENCOUNTER — Other Ambulatory Visit (HOSPITAL_COMMUNITY)
Admission: RE | Admit: 2019-02-13 | Discharge: 2019-02-13 | Disposition: A | Discharge status: Home / Self Care | Payer: Medicare Other | Source: Ambulatory Visit | Attending: Dermatology | Admitting: Dermatology

## 2019-02-13 DIAGNOSIS — E041 Nontoxic single thyroid nodule: Secondary | ICD-10-CM

## 2019-02-14 LAB — CYTOLOGY - NON PAP

## 2019-02-18 ENCOUNTER — Other Ambulatory Visit: Payer: Self-pay

## 2019-02-18 ENCOUNTER — Telehealth: Payer: Self-pay | Admitting: Neurology

## 2019-02-18 DIAGNOSIS — G5139 Clonic hemifacial spasm, unspecified: Secondary | ICD-10-CM

## 2019-02-18 DIAGNOSIS — R200 Anesthesia of skin: Secondary | ICD-10-CM

## 2019-02-18 DIAGNOSIS — G629 Polyneuropathy, unspecified: Secondary | ICD-10-CM

## 2019-02-18 NOTE — Telephone Encounter (Signed)
Ordered and faxed, sent message to referral for authroization faxed to [phone].

## 2019-02-18 NOTE — Telephone Encounter (Signed)
Radiology is calling in on this patient for the MRI orders. She said they received thembut it said with sedation- they don't do that, only anesthesia. She said to please resubmit the orders and make the correction for anesthesia and not sedation. Thanks!

## 2019-03-01 ENCOUNTER — Telehealth: Payer: Self-pay | Admitting: Neurology

## 2019-03-01 NOTE — Telephone Encounter (Signed)
Patient called regarding her MRI that is already set up. She said she received a call to set one up. She wants to make sure that another was ordered or she will have to pay for it. Please Call to confirm. Thank you

## 2019-03-01 NOTE — Telephone Encounter (Signed)
Left message for patient she wanted anesthesia per [facility] they schedule at [place], left message on patients voicemail.

## 2019-03-07 NOTE — Telephone Encounter (Signed)
Pt called and informed that MRI of brain looks okay.  MRI of cervical spine shows a lot of arthritic changes that are pressing on nerves,which may be contributing to the right sided neck pain.  This would be treated by spine specialist.  I have no explanation for facial numbness.  If she has further questions, she may make a follow up virtual visit to discuss.  Otherwise, I would return to the Sports Medicine doctor or spine specialist for neck pain, pt stated she would follow up with spine specialist

## 2019-03-07 NOTE — Telephone Encounter (Signed)
Called both numbers in chart and was unable to leave a message for the patient

## 2019-03-07 NOTE — Telephone Encounter (Signed)
Patient called for recent MRI results. 

## 2019-03-07 NOTE — Telephone Encounter (Signed)
MRI of brain looks okay.  MRI of cervical spine shows a lot of arthritic changes that are pressing on nerves,which may be contributing to the right sided neck pain.  This would be treated by spine specialist.  I have no explanation for facial numbness.  If she has further questions, she may make a follow up virtual visit to discuss.  Otherwise, I would return to the Sports Medicine doctor or spine specialist for neck pain

## 2019-03-11 ENCOUNTER — Telehealth: Payer: Self-pay | Admitting: Neurology

## 2019-03-11 NOTE — Telephone Encounter (Signed)
Patient returned call to nurse re: results.

## 2019-03-11 NOTE — Telephone Encounter (Signed)
Patient called regarding her MRI results. She said she forgot about getting the call. I explained to her from her last phone note she is to follow up with her Orthopedic Doctor, but she seemed very confused. Please Call. Thank you

## 2019-03-12 NOTE — Telephone Encounter (Signed)
Patient's daughter called and left a message on behalf of the patient requesting a call back. She said she needs MRI results again because the patient is confused.   Please call patient.

## 2019-03-13 NOTE — Telephone Encounter (Signed)
Left message for patient to call office back.

## 2019-04-19 ENCOUNTER — Telehealth: Payer: Self-pay

## 2019-04-19 NOTE — Telephone Encounter (Signed)
Patient called to schedule appointment regarding prolapse and vaginal irritation.

## 2019-04-19 NOTE — Telephone Encounter (Signed)
Spoke with patient. Patient is requesting OV to revisit options for tx of bladder prolapse and rectocele. Patient was seen 10/05/18 for evaluation and discussed options. Patient states symptoms have gotten worse. Reports increased pressure in rectum with increased activity and vaginal irritation. Patient denies urinary symptoms, vaginal bleeding or pelvic pain. Patient is voiding without difficulty. BMs are loose, no diarrhea or constipation.   OV scheduled for 4/15 at 8am with Dr. Quincy Simmonds. Patient verbalizes understanding and is agreeable.   Routing to provider for final review. Patient is agreeable to disposition. Will close encounter.

## 2019-04-22 ENCOUNTER — Telehealth: Payer: Self-pay | Admitting: Obstetrics and Gynecology

## 2019-04-22 NOTE — Telephone Encounter (Signed)
Left message regarding upcoming appointment for bladder prolapse/rectocele has been canceled and needs to be rescheduled.

## 2019-04-25 ENCOUNTER — Ambulatory Visit: Payer: Self-pay | Admitting: Obstetrics and Gynecology

## 2019-05-15 ENCOUNTER — Ambulatory Visit: Payer: Self-pay | Admitting: Obstetrics and Gynecology

## 2019-05-21 ENCOUNTER — Other Ambulatory Visit: Payer: Self-pay

## 2019-05-21 NOTE — Progress Notes (Signed)
GYNECOLOGY  VISIT   HPI: 79 y.o.   Widowed  Caucasian  female   [id number] with No LMP recorded (lmp unknown). Patient has had a hysterectomy.   here for pelvic prolapse.    Patient has been evaluated by [facility] and [facility] for her symptoms; she saw Ileana Roup also. She states she feels anxious that she may need surgery to be done.   Patient states she has vaginal burning/irritation after BM. Patient also states she has trouble voiding. Hx of anal fissure.  She denies feeling a vaginal bulge.   She has periods where she voids more and less.  No urinary incontinence.  She is not taking Myrbetriq.  She used it only 3 times and stopped.   She has some fecal incontinence when she takes Maalox for stomach spasms.  She is asking about a pap smear, which she states she has been told she needs yearly.  She has received her annual care at [facility].   Urine Dip:Neg  GYNECOLOGIC HISTORY: No LMP recorded (lmp unknown). Patient has had a hysterectomy. Contraception:  Hyst Menopausal hormone therapy:  none Last mammogram: 08/17/18 BIRADS 1 negative/density b Last pap smear:  Pap normal in 2011.   Hx ASCUS pap and positive HR HPV.  Unknown year.        OB History    Gravida  2   Para  2   Term  2   Preterm  0   AB  0   Living  1     SAB  0   TAB  0   Ectopic  0   Multiple  0   Live Births  1              Patient Active Problem List   Diagnosis Date Noted  . Facial spasm 02/29/2016  . Esophageal reflux 02/18/2016  . Neuropathy 02/18/2016    Past Medical History:  Diagnosis Date  . Allergy   . Anxiety   . Bilateral carotid artery stenosis   . Breast cancer ([place])   . Bronchitis   . Cancer ([place])    questionable cancer to nipple  . Chronic pain   . Colon spasm   . Diverticulitis   . Dysphagia   . Functional gait abnormality   . GERD (gastroesophageal reflux disease)   . Hyperlipidemia   . Hyperlipidemia   . Hypertension    . IBS (irritable bowel syndrome)   . Insomnia   . Kidney stones   . Low back pain   . Lumbar radiculopathy   . Myalgia   . Neuropathy   . Osteopenia   . Personal history of radiation therapy   . Sciatica   . Sleep disorder     Past Surgical History:  Procedure Laterality Date  . ABDOMINAL HYSTERECTOMY    . BREAST EXCISIONAL BIOPSY    . BREAST LUMPECTOMY Left    march 2000  . BREAST SURGERY     R lumpectomy 2000  . CATARACT EXTRACTION, BILATERAL    . EXPLORATORY LAPAROTOMY    . EXPLORATORY LAPAROTOMY    . EYE SURGERY     bilat cataract removal  . MOUTH SURGERY    . RADIOACTIVE SEED GUIDED EXCISIONAL BREAST BIOPSY Left 09/26/2016   Procedure: LEFT RADIOACTIVE SEED GUIDED EXCISIONAL BREAST BIOPSY ERAS PATHWAY;  Surgeon: Rolm Bookbinder, MD;  Location: [facility];  Service: General;  Laterality: Left;  LMA    Current Outpatient Medications  Medication Sig Dispense Refill  . amLODipine (NORVASC) 5 MG tablet     . cetirizine (ZYRTEC) 10 MG tablet Take 1 tablet (10 mg total) by mouth daily. 90 tablet 3  . losartan (COZAAR) 100 MG tablet Take 100 mg by mouth daily.    . montelukast (SINGULAIR) 10 MG tablet Take 1 tablet (10 mg total) by mouth at bedtime. 90 tablet 3  . pantoprazole (PROTONIX) 20 MG tablet Take by mouth.    . predniSONE (DELTASONE) 10 MG tablet     . Probiotic Product (PROBIOTIC-10) CAPS Take by mouth.     No current facility-administered medications for this visit.     ALLERGIES: Morphine and related, Codeine, Dexamethasone, Erythromycin, Hydrocodone, Lyrica [pregabalin], Prevacid [lansoprazole], Clarithromycin, Famotidine, Polytrim [polymyxin b-trimethoprim], Prednisone, Zantac [ranitidine hcl], and Penicillins  Family History  Problem Relation Age of Onset  . Congestive Heart Failure Mother        Died 74  . Kidney Stones Father        Died 48 - complications from kidney stone  . Breast cancer Sister 62  . Leukemia Maternal Uncle   .  Colon cancer Maternal Uncle     Social History   Socioeconomic History  . Marital status: Widowed    Spouse name: Not on file  . Number of children: 2  . Years of education: 10th  . Highest education level: Not on file  Occupational History  . Occupation: Retired  Tobacco Use  . Smoking status: Never Smoker  . Smokeless tobacco: Never Used  Substance and Sexual Activity  . Alcohol use: No  . Drug use: No  . Sexual activity: Not Currently    Birth control/protection: Surgical, Post-menopausal    Comment: Hysterectomy  Other Topics Concern  . Not on file  Social History Narrative   Lives at home alone.   Right-handed.   Occasional caffeine use.   Social Determinants of Health   Financial Resource Strain:   . Difficulty of Paying Living Expenses:   Food Insecurity:   . Worried About Charity fundraiser in the Last Year:   . Arboriculturist in the Last Year:   Transportation Needs:   . Film/video editor (Medical):   Marland Kitchen Lack of Transportation (Non-Medical):   Physical Activity:   . Days of Exercise per Week:   . Minutes of Exercise per Session:   Stress:   . Feeling of Stress :   Social Connections:   . Frequency of Communication with Friends and Family:   . Frequency of Social Gatherings with Friends and Family:   . Attends Religious Services:   . Active Member of Clubs or Organizations:   . Attends Archivist Meetings:   Marland Kitchen Marital Status:   Intimate Partner Violence:   . Fear of Current or Ex-Partner:   . Emotionally Abused:   Marland Kitchen Physically Abused:   . Sexually Abused:     Review of Systems  Genitourinary:       Pelvic prolapse Has trouble voiding  All other systems reviewed and are negative.   PHYSICAL EXAMINATION:    BP 140/82   Pulse 66   Temp (!) 97.4 F (36.3 C) (Temporal)   Ht 5' 1.25" (1.556 m)   Wt 147 lb (66.7 kg)   LMP  (LMP Unknown)   BMI 27.55 kg/m     General appearance: alert, cooperative and appears stated age    Abdomen: soft, non-tender, no masses,  no organomegaly  Pelvic: External genitalia:  no lesions              Urethra:  normal appearing urethra with no masses, tenderness or lesions              Bartholins and Skenes: normal                 Vagina: normal appearing vagina with normal color and discharge, no lesions Second degree cystocele, first degree rectocele.  Good apical support.               Cervix: absent                Bimanual Exam:  Uterus:  absent              Adnexa: no mass, fullness, tenderness              Rectal exam: Yes.  .  Confirms.              Anus:  normal sphincter tone, no lesions  Chaperone was present for exam.  ASSESSMENT  Status post TAH/right oophorectomy/bladder tack in 1964 or 1965.  Still has her left ovary.  I suspect she had a Burch or MMK procedure and a suprapubic catheter. Hx breast cancer. Second degree cystocele and first degree rectocele.  Stable since 09/2018. Difficulty voiding.  Normal urine dip.  Vaginal atrophy.  Hx ASCUS pap and positive HR HPV. Uncertain timing of this.  Hx renal stones.  Hx carotid artery stenosis.   PLAN  We discussed her stable pelvic organ prolapse.  We again reviewed options for care for her prolapse.  I reassured her that I do not see progression and that surgery is not strongly recommended.  She may follow up with urology for her voiding difficulty.  I do not see documentation in the limited records she brings today regarding recommendation for a yearly pap.  Fu prn.  An After Visit Summary was printed and given to the patient.  ___30___ minutes face to face time of which over 50% was spent in counseling.

## 2019-05-22 ENCOUNTER — Ambulatory Visit: Payer: Medicare Other | Admitting: Obstetrics and Gynecology

## 2019-05-22 ENCOUNTER — Encounter: Payer: Self-pay | Admitting: Obstetrics and Gynecology

## 2019-05-22 VITALS — BP 140/82 | HR 66 | Temp 97.4°F | Ht 61.25 in | Wt 147.0 lb

## 2019-05-22 DIAGNOSIS — N8110 Cystocele, unspecified: Secondary | ICD-10-CM | POA: Diagnosis not present

## 2019-05-22 DIAGNOSIS — N816 Rectocele: Secondary | ICD-10-CM | POA: Diagnosis not present

## 2019-05-22 LAB — POCT URINALYSIS DIPSTICK
Bilirubin, UA: NEGATIVE
Blood, UA: NEGATIVE
Glucose, UA: NEGATIVE
Ketones, UA: NEGATIVE
Leukocytes, UA: NEGATIVE
Nitrite, UA: NEGATIVE
Protein, UA: NEGATIVE
Urobilinogen, UA: 0.2 E.U./dL
pH, UA: 7 (ref 5.0–8.0)

## 2019-05-22 NOTE — Patient Instructions (Addendum)
Try Replens or a cooking oil for vaginal hydration.     Atrophic Vaginitis  Atrophic vaginitis is a condition in which the tissues that line the vagina become dry and thin. This condition is most common in women who have stopped having regular menstrual periods (are in menopause). This usually starts when a woman is 36-79 years old. That is the time when a woman's estrogen levels begin to drop (decrease). Estrogen is a female hormone. It helps to keep the tissues of the vagina moist. It stimulates the vagina to produce a clear fluid that lubricates the vagina for sexual intercourse. This fluid also protects the vagina from infection. Lack of estrogen can cause the lining of the vagina to get thinner and dryer. The vagina may also shrink in size. It may become less elastic. Atrophic vaginitis tends to get worse over time as a woman's estrogen level drops. What are the causes? This condition is caused by the normal drop in estrogen that happens around the time of menopause. What increases the risk? Certain conditions or situations may lower a woman's estrogen level, leading to a higher risk for atrophic vaginitis. You are more likely to develop this condition if:  You are taking medicines that block estrogen.  You have had your ovaries removed.  You are being treated for cancer with X-ray (radiation) or medicines (chemotherapy).  You have given birth or are breastfeeding.  You are older than age 7.  You smoke. What are the signs or symptoms? Symptoms of this condition include:  Pain, soreness, or bleeding during sexual intercourse (dyspareunia).  Vaginal burning, irritation, or itching.  Pain or bleeding when a speculum is used in a vaginal exam (pelvic exam).  Having burning pain when passing urine.  Vaginal discharge that is brown or yellow. In some cases, there are no symptoms. How is this diagnosed? This condition is diagnosed by taking a medical history and doing a physical  exam. This will include a pelvic exam that checks the vaginal tissues. Though rare, you may also have other tests, including:  A urine test.  A test that checks the acid balance in your vagina (acid balance test). How is this treated? Treatment for this condition depends on how severe your symptoms are. Treatment may include:  Using an over-the-counter vaginal lubricant before sex.  Using a long-acting vaginal moisturizer.  Using low-dose vaginal estrogen for moderate to severe symptoms that do not respond to other treatments. Options include creams, tablets, and inserts (vaginal rings). Before you use a vaginal estrogen, tell your health care provider if you have a history of: ? Breast cancer. ? Endometrial cancer. ? Blood clots. If you are not sexually active and your symptoms are very mild, you may not need treatment. Follow these instructions at home: Medicines  Take over-the-counter and prescription medicines only as told by your health care provider. Do not use herbal or alternative medicines unless your health care provider says that you can.  Use over-the-counter creams, lubricants, or moisturizers for dryness only as directed by your health care provider. General instructions  If your atrophic vaginitis is caused by menopause, discuss all of your menopause symptoms and treatment options with your health care provider.  Do not douche.  Do not use products that can make your vagina dry. These include: ? Scented feminine sprays. ? Scented tampons. ? Scented soaps.  Vaginal intercourse can help to improve blood flow and elasticity of vaginal tissue. If it hurts to have sex, try using a lubricant  or moisturizer just before having intercourse. Contact a health care provider if:  Your discharge looks different than normal.  Your vagina has an unusual smell.  You have new symptoms.  Your symptoms do not improve with treatment.  Your symptoms get  worse. Summary  Atrophic vaginitis is a condition in which the tissues that line the vagina become dry and thin. It is most common in women who have stopped having regular menstrual periods (are in menopause).  Treatment options include using vaginal lubricants and low-dose vaginal estrogen.  Contact a health care provider if your vagina has an unusual smell, or if your symptoms get worse or do not improve after treatment. This information is not intended to replace advice given to you by your health care provider. Make sure you discuss any questions you have with your health care provider. Document Revised: 12/09/2016 Document Reviewed: 09/22/2016 Elsevier Patient Education  2020 [employer].

## 2019-07-17 ENCOUNTER — Other Ambulatory Visit: Payer: Self-pay | Admitting: Family Medicine

## 2019-07-17 DIAGNOSIS — Z1231 Encounter for screening mammogram for malignant neoplasm of breast: Secondary | ICD-10-CM

## 2019-08-20 ENCOUNTER — Other Ambulatory Visit: Payer: Self-pay

## 2019-08-20 ENCOUNTER — Ambulatory Visit
Admission: RE | Admit: 2019-08-20 | Discharge: 2019-08-20 | Disposition: A | Discharge status: Home / Self Care | Payer: Medicare Other | Source: Ambulatory Visit | Attending: Family Medicine | Admitting: Family Medicine

## 2019-08-20 DIAGNOSIS — Z1231 Encounter for screening mammogram for malignant neoplasm of breast: Secondary | ICD-10-CM

## 2019-08-21 ENCOUNTER — Other Ambulatory Visit: Payer: Self-pay | Admitting: Family Medicine

## 2019-08-21 DIAGNOSIS — R928 Other abnormal and inconclusive findings on diagnostic imaging of breast: Secondary | ICD-10-CM

## 2019-09-03 ENCOUNTER — Other Ambulatory Visit: Payer: Self-pay

## 2019-09-03 ENCOUNTER — Ambulatory Visit
Admission: RE | Admit: 2019-09-03 | Discharge: 2019-09-03 | Disposition: A | Discharge status: Home / Self Care | Payer: Medicare Other | Source: Ambulatory Visit | Attending: Family Medicine | Admitting: Family Medicine

## 2019-09-03 ENCOUNTER — Ambulatory Visit: Payer: Medicare Other

## 2019-09-03 DIAGNOSIS — R928 Other abnormal and inconclusive findings on diagnostic imaging of breast: Secondary | ICD-10-CM

## 2019-11-14 ENCOUNTER — Other Ambulatory Visit: Payer: Self-pay | Admitting: Gastroenterology

## 2019-11-14 DIAGNOSIS — R1033 Periumbilical pain: Secondary | ICD-10-CM

## 2019-11-14 DIAGNOSIS — R634 Abnormal weight loss: Secondary | ICD-10-CM

## 2019-12-02 ENCOUNTER — Other Ambulatory Visit: Payer: Self-pay | Admitting: Allergy and Immunology

## 2019-12-12 ENCOUNTER — Other Ambulatory Visit: Payer: Medicare Other

## 2019-12-13 ENCOUNTER — Ambulatory Visit
Admission: RE | Admit: 2019-12-13 | Discharge: 2019-12-13 | Disposition: A | Discharge status: Home / Self Care | Payer: Medicare Other | Source: Ambulatory Visit | Attending: Gastroenterology | Admitting: Gastroenterology

## 2019-12-13 ENCOUNTER — Other Ambulatory Visit: Payer: Self-pay

## 2019-12-13 DIAGNOSIS — R1033 Periumbilical pain: Secondary | ICD-10-CM

## 2019-12-13 DIAGNOSIS — R634 Abnormal weight loss: Secondary | ICD-10-CM

## 2019-12-13 MED ORDER — IOPAMIDOL (ISOVUE-300) INJECTION 61%
100.0000 mL | Freq: Once | INTRAVENOUS | Status: AC | PRN
  Administered 2019-12-13: 100 mL via INTRAVENOUS

## 2019-12-17 ENCOUNTER — Other Ambulatory Visit: Payer: Medicare Other

## 2019-12-26 ENCOUNTER — Ambulatory Visit
Admission: RE | Admit: 2019-12-26 | Discharge: 2019-12-26 | Disposition: A | Discharge status: Home / Self Care | Payer: PRIVATE HEALTH INSURANCE | Source: Ambulatory Visit | Attending: Family Medicine | Admitting: Family Medicine

## 2019-12-26 ENCOUNTER — Other Ambulatory Visit: Payer: Self-pay

## 2019-12-26 ENCOUNTER — Other Ambulatory Visit: Payer: Self-pay | Admitting: Family Medicine

## 2019-12-26 DIAGNOSIS — S161XXA Strain of muscle, fascia and tendon at neck level, initial encounter: Secondary | ICD-10-CM

## 2020-03-24 DIAGNOSIS — K219 Gastro-esophageal reflux disease without esophagitis: Secondary | ICD-10-CM | POA: Diagnosis not present

## 2020-03-24 DIAGNOSIS — E7800 Pure hypercholesterolemia, unspecified: Secondary | ICD-10-CM | POA: Diagnosis not present

## 2020-03-24 DIAGNOSIS — R7303 Prediabetes: Secondary | ICD-10-CM | POA: Diagnosis not present

## 2020-03-24 DIAGNOSIS — I10 Essential (primary) hypertension: Secondary | ICD-10-CM | POA: Diagnosis not present

## 2020-03-24 DIAGNOSIS — Z0000 Encounter for general adult medical examination without abnormal findings: Secondary | ICD-10-CM | POA: Diagnosis not present

## 2020-04-17 DIAGNOSIS — R1013 Epigastric pain: Secondary | ICD-10-CM | POA: Diagnosis not present

## 2020-04-17 DIAGNOSIS — K529 Noninfective gastroenteritis and colitis, unspecified: Secondary | ICD-10-CM | POA: Diagnosis not present

## 2020-04-27 DIAGNOSIS — R1013 Epigastric pain: Secondary | ICD-10-CM | POA: Diagnosis not present

## 2020-04-30 ENCOUNTER — Emergency Department (HOSPITAL_COMMUNITY): Payer: Medicare Other

## 2020-04-30 ENCOUNTER — Emergency Department (HOSPITAL_COMMUNITY)
Admission: EM | Admit: 2020-04-30 | Discharge: 2020-04-30 | Disposition: A | Discharge status: Home / Self Care | Payer: Medicare Other | Attending: Emergency Medicine | Admitting: Emergency Medicine

## 2020-04-30 DIAGNOSIS — M5431 Sciatica, right side: Secondary | ICD-10-CM | POA: Diagnosis not present

## 2020-04-30 DIAGNOSIS — Z853 Personal history of malignant neoplasm of breast: Secondary | ICD-10-CM | POA: Diagnosis not present

## 2020-04-30 DIAGNOSIS — I10 Essential (primary) hypertension: Secondary | ICD-10-CM | POA: Insufficient documentation

## 2020-04-30 DIAGNOSIS — Z79899 Other long term (current) drug therapy: Secondary | ICD-10-CM | POA: Insufficient documentation

## 2020-04-30 DIAGNOSIS — Z743 Need for continuous supervision: Secondary | ICD-10-CM | POA: Diagnosis not present

## 2020-04-30 DIAGNOSIS — M25551 Pain in right hip: Secondary | ICD-10-CM | POA: Diagnosis not present

## 2020-04-30 DIAGNOSIS — R110 Nausea: Secondary | ICD-10-CM | POA: Diagnosis not present

## 2020-04-30 DIAGNOSIS — M25572 Pain in left ankle and joints of left foot: Secondary | ICD-10-CM | POA: Diagnosis not present

## 2020-04-30 DIAGNOSIS — R1111 Vomiting without nausea: Secondary | ICD-10-CM | POA: Diagnosis not present

## 2020-04-30 DIAGNOSIS — M549 Dorsalgia, unspecified: Secondary | ICD-10-CM | POA: Diagnosis not present

## 2020-04-30 LAB — CBC WITH DIFFERENTIAL/PLATELET
Abs Immature Granulocytes: 0.04 10*3/uL (ref 0.00–0.07)
Basophils Absolute: 0.1 10*3/uL (ref 0.0–0.1)
Basophils Relative: 0 %
Eosinophils Absolute: 0 10*3/uL (ref 0.0–0.5)
Eosinophils Relative: 0 %
HCT: 39.3 % (ref 36.0–46.0)
Hemoglobin: 13.5 g/dL (ref 12.0–15.0)
Immature Granulocytes: 0 %
Lymphocytes Relative: 11 %
Lymphs Abs: 1.4 10*3/uL (ref 0.7–4.0)
MCH: 30.3 pg (ref 26.0–34.0)
MCHC: 34.4 g/dL (ref 30.0–36.0)
MCV: 88.1 fL (ref 80.0–100.0)
Monocytes Absolute: 1.6 10*3/uL — ABNORMAL HIGH (ref 0.1–1.0)
Monocytes Relative: 13 %
Neutro Abs: 9.2 10*3/uL — ABNORMAL HIGH (ref 1.7–7.7)
Neutrophils Relative %: 76 %
Platelets: 250 10*3/uL (ref 150–400)
RBC: 4.46 MIL/uL (ref 3.87–5.11)
RDW: 12.9 % (ref 11.5–15.5)
WBC: 12.3 10*3/uL — ABNORMAL HIGH (ref 4.0–10.5)
nRBC: 0 % (ref 0.0–0.2)

## 2020-04-30 LAB — BASIC METABOLIC PANEL
Anion gap: 6 (ref 5–15)
BUN: 10 mg/dL (ref 8–23)
CO2: 26 mmol/L (ref 22–32)
Calcium: 8.8 mg/dL — ABNORMAL LOW (ref 8.9–10.3)
Chloride: 105 mmol/L (ref 98–111)
Creatinine, Ser: 0.38 mg/dL — ABNORMAL LOW (ref 0.44–1.00)
GFR, Estimated: >60 mL/min (ref >60)
Glucose, Bld: 124 mg/dL — ABNORMAL HIGH (ref 70–99)
Potassium: 3.6 mmol/L (ref 3.5–5.1)
Sodium: 137 mmol/L (ref 135–145)

## 2020-04-30 MED ORDER — SODIUM CHLORIDE 0.9 % IV SOLN: INTRAVENOUS | Status: DC

## 2020-04-30 MED ORDER — IBUPROFEN 400 MG PO TABS: 400.0000 mg | ORAL_TABLET | Freq: Four times a day (QID) | ORAL | 0 refills | Status: DC | PRN

## 2020-04-30 MED ORDER — OXYCODONE-ACETAMINOPHEN 5-325 MG PO TABS
1.0000 | ORAL_TABLET | Freq: Once | ORAL | Status: AC
  Administered 2020-04-30: 1 via ORAL
  Filled 2020-04-30: qty 1

## 2020-04-30 MED ORDER — OXYCODONE-ACETAMINOPHEN 5-325 MG PO TABS: 1.0000 | ORAL_TABLET | Freq: Four times a day (QID) | ORAL | 0 refills | Status: DC | PRN

## 2020-04-30 MED ORDER — ONDANSETRON 8 MG PO TBDP
8.0000 mg | ORAL_TABLET | Freq: Once | ORAL | Status: AC
  Administered 2020-04-30: 8 mg via ORAL
  Filled 2020-04-30: qty 1

## 2020-04-30 MED ORDER — FENTANYL CITRATE (PF) 100 MCG/2ML IJ SOLN
50.0000 ug | Freq: Once | INTRAMUSCULAR | Status: AC
  Administered 2020-04-30: 50 ug via INTRAVENOUS
  Filled 2020-04-30: qty 2

## 2020-04-30 MED ORDER — DIAZEPAM 5 MG PO TABS
5.0000 mg | ORAL_TABLET | Freq: Once | ORAL | Status: AC
  Administered 2020-04-30: 5 mg via ORAL
  Filled 2020-04-30: qty 1

## 2020-04-30 MED ORDER — KETOROLAC TROMETHAMINE 15 MG/ML IJ SOLN
15.0000 mg | Freq: Once | INTRAMUSCULAR | Status: AC
  Administered 2020-04-30: 15 mg via INTRAVENOUS
  Filled 2020-04-30: qty 1

## 2020-04-30 MED ORDER — ONDANSETRON 8 MG PO TBDP: 8.0000 mg | ORAL_TABLET | Freq: Three times a day (TID) | ORAL | 0 refills | Status: DC | PRN

## 2020-04-30 MED ORDER — METHOCARBAMOL 1000 MG/10ML IJ SOLN
500.0000 mg | Freq: Once | INTRAMUSCULAR | Status: AC
  Administered 2020-04-30: 500 mg via INTRAMUSCULAR
  Filled 2020-04-30: qty 5

## 2020-04-30 MED ORDER — METHOCARBAMOL 500 MG PO TABS: 500.0000 mg | ORAL_TABLET | Freq: Two times a day (BID) | ORAL | 0 refills | Status: DC

## 2020-04-30 MED ORDER — DIAZEPAM 2 MG PO TABS: 2.0000 mg | ORAL_TABLET | Freq: Four times a day (QID) | ORAL | 0 refills | Status: DC | PRN

## 2020-04-30 MED ORDER — MORPHINE SULFATE (PF) 4 MG/ML IV SOLN
4.0000 mg | Freq: Once | INTRAVENOUS | Status: DC
  Filled 2020-04-30: qty 1

## 2020-04-30 NOTE — ED Triage Notes (Signed)
Pt coming in from home for right hip pain that started yesterday. Pt denies any injury.

## 2020-04-30 NOTE — ED Provider Notes (Addendum)
[facility] DEPT Provider Note   CSN: [id number] Arrival date & time: 04/30/20  9518     History Chief Complaint  Patient presents with  . Hip Pain    Alexandra Gonzales is a 80 y.o. female.  80 year old female presents with pain to her right gluteal region.  Pain does not radiate down her leg.  Is been atraumatic.  Is characterizes sharp and worse with standing.  No prior history of same.  No treatment use prior to arrival        Past Medical History:  Diagnosis Date  . Allergy   . Anxiety   . Bilateral carotid artery stenosis   . Breast cancer ([place])   . Bronchitis   . Cancer ([place])    questionable cancer to nipple  . Chronic pain   . Colon spasm   . Diverticulitis   . Dysphagia   . Functional gait abnormality   . GERD (gastroesophageal reflux disease)   . Hyperlipidemia   . Hyperlipidemia   . Hypertension   . IBS (irritable bowel syndrome)   . Insomnia   . Kidney stones   . Low back pain   . Lumbar radiculopathy   . Myalgia   . Neuropathy   . Osteopenia   . Personal history of radiation therapy   . Sciatica   . Sleep disorder     Patient Active Problem List   Diagnosis Date Noted  . Facial spasm 02/29/2016  . Esophageal reflux 02/18/2016  . Neuropathy 02/18/2016    Past Surgical History:  Procedure Laterality Date  . ABDOMINAL HYSTERECTOMY    . BREAST EXCISIONAL BIOPSY    . BREAST LUMPECTOMY Left    march 2000  . BREAST SURGERY     R lumpectomy 2000  . CATARACT EXTRACTION, BILATERAL    . EXPLORATORY LAPAROTOMY    . EXPLORATORY LAPAROTOMY    . EYE SURGERY     bilat cataract removal  . MOUTH SURGERY    . RADIOACTIVE SEED GUIDED EXCISIONAL BREAST BIOPSY Left 09/26/2016   Procedure: LEFT RADIOACTIVE SEED GUIDED EXCISIONAL BREAST BIOPSY ERAS PATHWAY;  Surgeon: Rolm Bookbinder, MD;  Location: [facility];  Service: General;  Laterality: Left;  LMA     OB History    Gravida  2   Para  2   Term  2    Preterm  0   AB  0   Living  1     SAB  0   IAB  0   Ectopic  0   Multiple  0   Live Births  1           Family History  Problem Relation Age of Onset  . Congestive Heart Failure Mother        Died 74  . Kidney Stones Father        Died 48 - complications from kidney stone  . Breast cancer Sister 78  . Leukemia Maternal Uncle   . Colon cancer Maternal Uncle     Social History   Tobacco Use  . Smoking status: Never Smoker  . Smokeless tobacco: Never Used  Vaping Use  . Vaping Use: Never used  Substance Use Topics  . Alcohol use: No  . Drug use: No    Home Medications Prior to Admission medications   Medication Sig Start Date End Date Taking? Authorizing Provider  amLODipine (NORVASC) 5 MG tablet  03/14/18   [provider]  cetirizine (ZYRTEC)  10 MG tablet Take 1 tablet (10 mg total) by mouth daily. 11/07/18   Kozlow, Donnamarie Poag, MD  losartan (COZAAR) 100 MG tablet Take 100 mg by mouth daily. 04/23/19   [provider]  montelukast (SINGULAIR) 10 MG tablet Take 1 tablet (10 mg total) by mouth at bedtime. 08/08/18   Kozlow, Donnamarie Poag, MD  pantoprazole (PROTONIX) 20 MG tablet Take by mouth.    [provider]  predniSONE (DELTASONE) 10 MG tablet  10/31/18   [provider]  Probiotic Product (PROBIOTIC-10) CAPS Take by mouth.    [provider]    Allergies    Morphine and related, Codeine, Dexamethasone, Erythromycin, Hydrocodone, Lyrica [pregabalin], Prevacid [lansoprazole], Clarithromycin, Famotidine, Polytrim [polymyxin b-trimethoprim], Prednisone, Zantac [ranitidine hcl], and Penicillins  Review of Systems   Review of Systems  All other systems reviewed and are negative.   Physical Exam Updated Vital Signs BP (!) 175/61   Pulse 67   Temp 98.3 F (36.8 C) (Oral)   Resp 18   Ht 1.575 m (5\' 2" )   Wt 63.5 kg   LMP  (LMP Unknown)   SpO2 96%   BMI 25.61 kg/m   Physical Exam Vitals and nursing note reviewed.   Constitutional:      General: She is not in acute distress.    Appearance: Normal appearance. She is well-developed. She is not toxic-appearing.  HENT:     Head: Normocephalic and atraumatic.  Eyes:     General: Lids are normal.     Conjunctiva/sclera: Conjunctivae normal.     Pupils: Pupils are equal, round, and reactive to light.  Neck:     Thyroid: No thyroid mass.     Trachea: No tracheal deviation.  Cardiovascular:     Rate and Rhythm: Normal rate and regular rhythm.     Heart sounds: Normal heart sounds. No murmur heard. No gallop.   Pulmonary:     Effort: Pulmonary effort is normal. No respiratory distress.     Breath sounds: Normal breath sounds. No stridor. No decreased breath sounds, wheezing, rhonchi or rales.  Abdominal:     General: Bowel sounds are normal. There is no distension.     Palpations: Abdomen is soft.     Tenderness: There is no abdominal tenderness. There is no rebound.  Musculoskeletal:        General: No tenderness. Normal range of motion.     Cervical back: Normal range of motion and neck supple.       Legs:     Comments: Neurovascular intact at right foot.  Strength is 5 of 5 in upper as well as lower extremities  Skin:    General: Skin is warm and dry.     Findings: No abrasion or rash.  Neurological:     Mental Status: She is alert and oriented to person, place, and time.     GCS: GCS eye subscore is 4. GCS verbal subscore is 5. GCS motor subscore is 6.     Cranial Nerves: No cranial nerve deficit.     Sensory: No sensory deficit.  Psychiatric:        Speech: Speech normal.        Behavior: Behavior normal.     ED Results / Procedures / Treatments   Labs (all labs ordered are listed, but only abnormal results are displayed) Labs Reviewed - No data to display  EKG None  Radiology No results found.  Procedures Procedures   Medications Ordered in ED  Medications  ondansetron (ZOFRAN-ODT) disintegrating tablet 8 mg (has no  administration in time range)  morphine 4 MG/ML injection 4 mg (has no administration in time range)    ED Course  I have reviewed the triage vital signs and the nursing notes.  Pertinent labs & imaging results that were available during my care of the patient were reviewed by me and considered in my medical decision making (see chart for details).    MDM Rules/Calculators/A&P                          Patient with likely sciatica medicated.  Will give short course of analgesics and return precautions 3:24 PM Patient given multiple doses of medication here and cannot ambulate.  CT of right hip negative.  Plan to place patient on low-dose Valium, Percocet.  Informed to return if worse.  Daughter and patient are comfortable with this Final Clinical Impression(s) / ED Diagnoses Final diagnoses:  None    Rx / DC Orders ED Discharge Orders    None       Lacretia Leigh, MD 04/30/20 1030    Lacretia Leigh, MD 04/30/20 1525

## 2020-05-08 DIAGNOSIS — M5431 Sciatica, right side: Secondary | ICD-10-CM | POA: Diagnosis not present

## 2020-05-25 ENCOUNTER — Other Ambulatory Visit: Payer: Self-pay | Admitting: Radiology

## 2020-05-25 DIAGNOSIS — Z1231 Encounter for screening mammogram for malignant neoplasm of breast: Secondary | ICD-10-CM

## 2020-05-27 DIAGNOSIS — G8929 Other chronic pain: Secondary | ICD-10-CM | POA: Diagnosis not present

## 2020-05-27 DIAGNOSIS — E7800 Pure hypercholesterolemia, unspecified: Secondary | ICD-10-CM | POA: Diagnosis not present

## 2020-05-27 DIAGNOSIS — K219 Gastro-esophageal reflux disease without esophagitis: Secondary | ICD-10-CM | POA: Diagnosis not present

## 2020-05-27 DIAGNOSIS — I10 Essential (primary) hypertension: Secondary | ICD-10-CM | POA: Diagnosis not present

## 2020-05-27 DIAGNOSIS — J4521 Mild intermittent asthma with (acute) exacerbation: Secondary | ICD-10-CM | POA: Diagnosis not present

## 2020-05-27 DIAGNOSIS — E785 Hyperlipidemia, unspecified: Secondary | ICD-10-CM | POA: Diagnosis not present

## 2020-07-02 DIAGNOSIS — I10 Essential (primary) hypertension: Secondary | ICD-10-CM | POA: Diagnosis not present

## 2020-07-02 DIAGNOSIS — M7062 Trochanteric bursitis, left hip: Secondary | ICD-10-CM | POA: Diagnosis not present

## 2020-07-02 DIAGNOSIS — E162 Hypoglycemia, unspecified: Secondary | ICD-10-CM | POA: Diagnosis not present

## 2020-07-02 DIAGNOSIS — K582 Mixed irritable bowel syndrome: Secondary | ICD-10-CM | POA: Diagnosis not present

## 2020-07-02 DIAGNOSIS — E7800 Pure hypercholesterolemia, unspecified: Secondary | ICD-10-CM | POA: Diagnosis not present

## 2020-07-02 DIAGNOSIS — M5431 Sciatica, right side: Secondary | ICD-10-CM | POA: Diagnosis not present

## 2020-07-02 DIAGNOSIS — R7303 Prediabetes: Secondary | ICD-10-CM | POA: Diagnosis not present

## 2020-07-08 DIAGNOSIS — E7800 Pure hypercholesterolemia, unspecified: Secondary | ICD-10-CM | POA: Diagnosis not present

## 2020-07-08 DIAGNOSIS — G8929 Other chronic pain: Secondary | ICD-10-CM | POA: Diagnosis not present

## 2020-07-08 DIAGNOSIS — E785 Hyperlipidemia, unspecified: Secondary | ICD-10-CM | POA: Diagnosis not present

## 2020-07-08 DIAGNOSIS — J4521 Mild intermittent asthma with (acute) exacerbation: Secondary | ICD-10-CM | POA: Diagnosis not present

## 2020-07-08 DIAGNOSIS — I10 Essential (primary) hypertension: Secondary | ICD-10-CM | POA: Diagnosis not present

## 2020-07-08 DIAGNOSIS — K219 Gastro-esophageal reflux disease without esophagitis: Secondary | ICD-10-CM | POA: Diagnosis not present

## 2020-09-04 ENCOUNTER — Ambulatory Visit: Payer: PRIVATE HEALTH INSURANCE

## 2020-09-09 ENCOUNTER — Ambulatory Visit
Admission: RE | Admit: 2020-09-09 | Discharge: 2020-09-09 | Disposition: A | Discharge status: Home / Self Care | Payer: Medicare Other | Source: Ambulatory Visit | Attending: Radiology | Admitting: Radiology

## 2020-09-09 ENCOUNTER — Other Ambulatory Visit: Payer: Self-pay

## 2020-09-09 DIAGNOSIS — Z1231 Encounter for screening mammogram for malignant neoplasm of breast: Secondary | ICD-10-CM

## 2020-09-23 DIAGNOSIS — E785 Hyperlipidemia, unspecified: Secondary | ICD-10-CM | POA: Diagnosis not present

## 2020-09-23 DIAGNOSIS — J4521 Mild intermittent asthma with (acute) exacerbation: Secondary | ICD-10-CM | POA: Diagnosis not present

## 2020-09-23 DIAGNOSIS — K582 Mixed irritable bowel syndrome: Secondary | ICD-10-CM | POA: Diagnosis not present

## 2020-09-23 DIAGNOSIS — K219 Gastro-esophageal reflux disease without esophagitis: Secondary | ICD-10-CM | POA: Diagnosis not present

## 2020-09-23 DIAGNOSIS — G8929 Other chronic pain: Secondary | ICD-10-CM | POA: Diagnosis not present

## 2020-09-23 DIAGNOSIS — I10 Essential (primary) hypertension: Secondary | ICD-10-CM | POA: Diagnosis not present

## 2020-09-23 DIAGNOSIS — E7800 Pure hypercholesterolemia, unspecified: Secondary | ICD-10-CM | POA: Diagnosis not present

## 2020-10-09 DIAGNOSIS — F5101 Primary insomnia: Secondary | ICD-10-CM | POA: Diagnosis not present

## 2020-10-09 DIAGNOSIS — I10 Essential (primary) hypertension: Secondary | ICD-10-CM | POA: Diagnosis not present

## 2020-10-09 DIAGNOSIS — K219 Gastro-esophageal reflux disease without esophagitis: Secondary | ICD-10-CM | POA: Diagnosis not present

## 2020-10-09 DIAGNOSIS — R7303 Prediabetes: Secondary | ICD-10-CM | POA: Diagnosis not present

## 2020-10-09 DIAGNOSIS — E7800 Pure hypercholesterolemia, unspecified: Secondary | ICD-10-CM | POA: Diagnosis not present

## 2020-12-21 DIAGNOSIS — K219 Gastro-esophageal reflux disease without esophagitis: Secondary | ICD-10-CM | POA: Diagnosis not present

## 2020-12-21 DIAGNOSIS — F5101 Primary insomnia: Secondary | ICD-10-CM | POA: Diagnosis not present

## 2020-12-21 DIAGNOSIS — N3000 Acute cystitis without hematuria: Secondary | ICD-10-CM | POA: Diagnosis not present

## 2020-12-21 DIAGNOSIS — R7303 Prediabetes: Secondary | ICD-10-CM | POA: Diagnosis not present

## 2020-12-21 DIAGNOSIS — E7800 Pure hypercholesterolemia, unspecified: Secondary | ICD-10-CM | POA: Diagnosis not present

## 2020-12-21 DIAGNOSIS — I10 Essential (primary) hypertension: Secondary | ICD-10-CM | POA: Diagnosis not present

## 2020-12-29 ENCOUNTER — Emergency Department (HOSPITAL_COMMUNITY): Payer: Medicare Other

## 2020-12-29 ENCOUNTER — Other Ambulatory Visit: Payer: Self-pay

## 2020-12-29 ENCOUNTER — Inpatient Hospital Stay (HOSPITAL_COMMUNITY)
Admission: EM | Admit: 2020-12-29 | Discharge: 2020-12-31 | DRG: 690 | Disposition: A | Discharge status: Home / Self Care | Payer: Medicare Other | Attending: Internal Medicine | Admitting: Internal Medicine

## 2020-12-29 ENCOUNTER — Ambulatory Visit
Admission: EM | Admit: 2020-12-29 | Discharge: 2020-12-29 | Disposition: A | Discharge status: Home / Self Care | Payer: Medicare Other

## 2020-12-29 ENCOUNTER — Encounter: Payer: Self-pay | Admitting: Emergency Medicine

## 2020-12-29 DIAGNOSIS — F419 Anxiety disorder, unspecified: Secondary | ICD-10-CM | POA: Diagnosis present

## 2020-12-29 DIAGNOSIS — N390 Urinary tract infection, site not specified: Secondary | ICD-10-CM | POA: Diagnosis present

## 2020-12-29 DIAGNOSIS — Z880 Allergy status to penicillin: Secondary | ICD-10-CM

## 2020-12-29 DIAGNOSIS — Z9842 Cataract extraction status, left eye: Secondary | ICD-10-CM | POA: Diagnosis not present

## 2020-12-29 DIAGNOSIS — J309 Allergic rhinitis, unspecified: Secondary | ICD-10-CM | POA: Diagnosis present

## 2020-12-29 DIAGNOSIS — M5136 Other intervertebral disc degeneration, lumbar region: Secondary | ICD-10-CM | POA: Diagnosis present

## 2020-12-29 DIAGNOSIS — D72829 Elevated white blood cell count, unspecified: Secondary | ICD-10-CM

## 2020-12-29 DIAGNOSIS — K589 Irritable bowel syndrome without diarrhea: Secondary | ICD-10-CM | POA: Diagnosis not present

## 2020-12-29 DIAGNOSIS — Z884 Allergy status to anesthetic agent status: Secondary | ICD-10-CM

## 2020-12-29 DIAGNOSIS — E785 Hyperlipidemia, unspecified: Secondary | ICD-10-CM | POA: Diagnosis present

## 2020-12-29 DIAGNOSIS — Z9841 Cataract extraction status, right eye: Secondary | ICD-10-CM

## 2020-12-29 DIAGNOSIS — N200 Calculus of kidney: Secondary | ICD-10-CM | POA: Diagnosis not present

## 2020-12-29 DIAGNOSIS — Z803 Family history of malignant neoplasm of breast: Secondary | ICD-10-CM

## 2020-12-29 DIAGNOSIS — R778 Other specified abnormalities of plasma proteins: Secondary | ICD-10-CM

## 2020-12-29 DIAGNOSIS — G8929 Other chronic pain: Secondary | ICD-10-CM | POA: Diagnosis not present

## 2020-12-29 DIAGNOSIS — F05 Delirium due to known physiological condition: Secondary | ICD-10-CM

## 2020-12-29 DIAGNOSIS — Z90710 Acquired absence of both cervix and uterus: Secondary | ICD-10-CM

## 2020-12-29 DIAGNOSIS — R0602 Shortness of breath: Secondary | ICD-10-CM | POA: Diagnosis not present

## 2020-12-29 DIAGNOSIS — I10 Essential (primary) hypertension: Secondary | ICD-10-CM | POA: Diagnosis not present

## 2020-12-29 DIAGNOSIS — Z79899 Other long term (current) drug therapy: Secondary | ICD-10-CM | POA: Diagnosis not present

## 2020-12-29 DIAGNOSIS — Z7952 Long term (current) use of systemic steroids: Secondary | ICD-10-CM | POA: Diagnosis not present

## 2020-12-29 DIAGNOSIS — Z8249 Family history of ischemic heart disease and other diseases of the circulatory system: Secondary | ICD-10-CM

## 2020-12-29 DIAGNOSIS — Z87442 Personal history of urinary calculi: Secondary | ICD-10-CM | POA: Diagnosis not present

## 2020-12-29 DIAGNOSIS — Z882 Allergy status to sulfonamides status: Secondary | ICD-10-CM

## 2020-12-29 DIAGNOSIS — Z888 Allergy status to other drugs, medicaments and biological substances status: Secondary | ICD-10-CM

## 2020-12-29 DIAGNOSIS — I248 Other forms of acute ischemic heart disease: Secondary | ICD-10-CM | POA: Diagnosis present

## 2020-12-29 DIAGNOSIS — K219 Gastro-esophageal reflux disease without esophagitis: Secondary | ICD-10-CM | POA: Diagnosis not present

## 2020-12-29 DIAGNOSIS — N3000 Acute cystitis without hematuria: Principal | ICD-10-CM | POA: Diagnosis present

## 2020-12-29 DIAGNOSIS — Z881 Allergy status to other antibiotic agents status: Secondary | ICD-10-CM

## 2020-12-29 DIAGNOSIS — Z923 Personal history of irradiation: Secondary | ICD-10-CM

## 2020-12-29 DIAGNOSIS — Z20822 Contact with and (suspected) exposure to covid-19: Secondary | ICD-10-CM | POA: Diagnosis not present

## 2020-12-29 DIAGNOSIS — R519 Headache, unspecified: Secondary | ICD-10-CM | POA: Diagnosis not present

## 2020-12-29 DIAGNOSIS — R109 Unspecified abdominal pain: Secondary | ICD-10-CM | POA: Diagnosis not present

## 2020-12-29 LAB — CBC WITH DIFFERENTIAL/PLATELET
Abs Immature Granulocytes: 0.11 10*3/uL — ABNORMAL HIGH (ref 0.00–0.07)
Basophils Absolute: 0 10*3/uL (ref 0.0–0.1)
Basophils Relative: 0 %
Eosinophils Absolute: 0.5 10*3/uL (ref 0.0–0.5)
Eosinophils Relative: 2 %
HCT: 41 % (ref 36.0–46.0)
Hemoglobin: 13.8 g/dL (ref 12.0–15.0)
Immature Granulocytes: 1 %
Lymphocytes Relative: 5 %
Lymphs Abs: 1 10*3/uL (ref 0.7–4.0)
MCH: 30.3 pg (ref 26.0–34.0)
MCHC: 33.7 g/dL (ref 30.0–36.0)
MCV: 89.9 fL (ref 80.0–100.0)
Monocytes Absolute: 1.8 10*3/uL — ABNORMAL HIGH (ref 0.1–1.0)
Monocytes Relative: 8 %
Neutro Abs: 18.9 10*3/uL — ABNORMAL HIGH (ref 1.7–7.7)
Neutrophils Relative %: 84 %
Platelets: 272 10*3/uL (ref 150–400)
RBC: 4.56 MIL/uL (ref 3.87–5.11)
RDW: 13.2 % (ref 11.5–15.5)
WBC: 22.3 10*3/uL — ABNORMAL HIGH (ref 4.0–10.5)
nRBC: 0 % (ref 0.0–0.2)

## 2020-12-29 LAB — COMPREHENSIVE METABOLIC PANEL
ALT: 20 U/L (ref 0–44)
AST: 23 U/L (ref 15–41)
Albumin: 3.8 g/dL (ref 3.5–5.0)
Alkaline Phosphatase: 74 U/L (ref 38–126)
Anion gap: 10 (ref 5–15)
BUN: 12 mg/dL (ref 8–23)
CO2: 24 mmol/L (ref 22–32)
Calcium: 9.3 mg/dL (ref 8.9–10.3)
Chloride: 103 mmol/L (ref 98–111)
Creatinine, Ser: 0.63 mg/dL (ref 0.44–1.00)
GFR, Estimated: >60 mL/min (ref >60)
Glucose, Bld: 143 mg/dL — ABNORMAL HIGH (ref 70–99)
Potassium: 3.7 mmol/L (ref 3.5–5.1)
Sodium: 137 mmol/L (ref 135–145)
Total Bilirubin: 0.6 mg/dL (ref 0.3–1.2)
Total Protein: 6.6 g/dL (ref 6.5–8.1)

## 2020-12-29 LAB — URINALYSIS, ROUTINE W REFLEX MICROSCOPIC
Bilirubin Urine: NEGATIVE
Glucose, UA: >=500 mg/dL — AB
Hgb urine dipstick: NEGATIVE
Ketones, ur: 5 mg/dL — AB
Nitrite: NEGATIVE
Protein, ur: NEGATIVE mg/dL
Specific Gravity, Urine: 1.014 (ref 1.005–1.030)
pH: 5 (ref 5.0–8.0)

## 2020-12-29 LAB — RESP PANEL BY RT-PCR (FLU A&B, COVID) ARPGX2
Influenza A by PCR: NEGATIVE
Influenza B by PCR: NEGATIVE
SARS Coronavirus 2 by RT PCR: NEGATIVE

## 2020-12-29 NOTE — ED Provider Notes (Signed)
Patient here today with daughter for evaluation of confusion, worsening "shaking" and shortness of breath. She was recently treated for UTI but states that she started feeling better and stopped antibiotics. I recommended further eval in the ED as I suspect she will need stat labs. Daughter is agreeable and will drive her to ED after leaving our office.    Francene Finders, PA-C 12/29/20 1836

## 2020-12-29 NOTE — ED Triage Notes (Signed)
Diagnosed with a UTI 5 days ago, took antibiotics for a few days then started feeling cold chills/shaky, stopped antibiotics Sunday, started feeling better. Sunday felt SOB, weak, felt better after stopping antibiotics. Felt bad Monday, worse today, started taking the antibiotic again.   Symptoms have worsened into considerable shortness of breath, worse with movement, shaking, confused, states she's been seeing things that haven't really been there, reports "a nerve pain or something in my head" and bilateral hip pain. Denies hx of diabetes, chest pain, head injury.

## 2020-12-29 NOTE — ED Provider Notes (Signed)
Emergency Medicine Provider Triage Evaluation Note  Alexandra Gonzales , a 80 y.o. female  was evaluated in triage.  Pt complains of 3 days of chills, shortness of breath, sore throat.  She has generalized abdominal pain with nausea but no vomiting.  No known sick contacts.  Reports generalized malaise.  Recent diagnosis of UTI, taking Bactrim.  Review of Systems  Positive: Chills, shortness of breath, cough Negative: Vomiting  Physical Exam  LMP  (LMP Unknown)  Gen:   Awake, no distress   Resp:  Normal effort  MSK:   Moves extremities without difficulty  Other:  Lungs clear to auscultation bilaterally, generalized abdominal pain, mild  Medical Decision Making  Medically screening exam initiated at 8:13 PM.  Appropriate orders placed.  Alexandra Gonzales was informed that the remainder of the evaluation will be completed by another provider, this initial triage assessment does not replace that evaluation, and the importance of remaining in the ED until their evaluation is complete.     Carlisle Cater, PA-C 12/29/20 2014    Teressa Lower, MD 12/29/20 [phone]

## 2020-12-29 NOTE — ED Provider Notes (Deleted)
Emergency Medicine Provider Triage Evaluation Note  Alexandra Gonzales , a 80 y.o. female  was evaluated in triage.  Pt complains of fall.  Patient slipped on the floor last night.  She struck her right shoulder and lateral ribs on the post of the bed and then her head on the ground.  She has been dizzy since that time.  She had some shaking today while trying to get up.  She is typically on 2 L nasal cannula oxygen at home due to hypoxia with exertion.  No vomiting or confusion.  No anticoagulation.  Review of Systems  Positive: Shoulder pain, dizziness Negative: Vomiting  Physical Exam  LMP  (LMP Unknown)  Gen:   Awake, no distress   Resp:  Normal effort  MSK:   Moves extremities without difficulty  Other:  Decreased range of motion actively of the right shoulder, tenderness anteriorly and in the axillary area.  Lungs are clear.  Gross neuro exam negative.  Medical Decision Making  Medically screening exam initiated at 8:05 PM.  Appropriate orders placed.  KHALILA BUECHNER was informed that the remainder of the evaluation will be completed by another provider, this initial triage assessment does not replace that evaluation, and the importance of remaining in the ED until their evaluation is complete.     Carlisle Cater, PA-C 12/29/20 2006

## 2020-12-29 NOTE — ED Triage Notes (Signed)
Pt c/o shaking, SHOB x3 days. Also endorses sore throat, abd pain & HA. On sulfa for UTI, endorses nausea. States she "just feels awful."

## 2020-12-30 ENCOUNTER — Emergency Department (HOSPITAL_COMMUNITY): Payer: Medicare Other

## 2020-12-30 ENCOUNTER — Encounter (HOSPITAL_COMMUNITY): Payer: Self-pay | Admitting: Family Medicine

## 2020-12-30 DIAGNOSIS — Z884 Allergy status to anesthetic agent status: Secondary | ICD-10-CM | POA: Diagnosis not present

## 2020-12-30 DIAGNOSIS — Z90710 Acquired absence of both cervix and uterus: Secondary | ICD-10-CM | POA: Diagnosis not present

## 2020-12-30 DIAGNOSIS — N200 Calculus of kidney: Secondary | ICD-10-CM | POA: Diagnosis present

## 2020-12-30 DIAGNOSIS — Z87442 Personal history of urinary calculi: Secondary | ICD-10-CM | POA: Diagnosis not present

## 2020-12-30 DIAGNOSIS — Z923 Personal history of irradiation: Secondary | ICD-10-CM | POA: Diagnosis not present

## 2020-12-30 DIAGNOSIS — I248 Other forms of acute ischemic heart disease: Secondary | ICD-10-CM | POA: Diagnosis present

## 2020-12-30 DIAGNOSIS — D72829 Elevated white blood cell count, unspecified: Secondary | ICD-10-CM

## 2020-12-30 DIAGNOSIS — J309 Allergic rhinitis, unspecified: Secondary | ICD-10-CM

## 2020-12-30 DIAGNOSIS — N390 Urinary tract infection, site not specified: Secondary | ICD-10-CM | POA: Diagnosis not present

## 2020-12-30 DIAGNOSIS — G8929 Other chronic pain: Secondary | ICD-10-CM | POA: Diagnosis present

## 2020-12-30 DIAGNOSIS — Z9842 Cataract extraction status, left eye: Secondary | ICD-10-CM | POA: Diagnosis not present

## 2020-12-30 DIAGNOSIS — Z803 Family history of malignant neoplasm of breast: Secondary | ICD-10-CM | POA: Diagnosis not present

## 2020-12-30 DIAGNOSIS — Z79899 Other long term (current) drug therapy: Secondary | ICD-10-CM | POA: Diagnosis not present

## 2020-12-30 DIAGNOSIS — R778 Other specified abnormalities of plasma proteins: Secondary | ICD-10-CM

## 2020-12-30 DIAGNOSIS — Z7952 Long term (current) use of systemic steroids: Secondary | ICD-10-CM | POA: Diagnosis not present

## 2020-12-30 DIAGNOSIS — Z882 Allergy status to sulfonamides status: Secondary | ICD-10-CM | POA: Diagnosis not present

## 2020-12-30 DIAGNOSIS — N3000 Acute cystitis without hematuria: Secondary | ICD-10-CM | POA: Diagnosis present

## 2020-12-30 DIAGNOSIS — Z881 Allergy status to other antibiotic agents status: Secondary | ICD-10-CM | POA: Diagnosis not present

## 2020-12-30 DIAGNOSIS — I10 Essential (primary) hypertension: Secondary | ICD-10-CM | POA: Diagnosis present

## 2020-12-30 DIAGNOSIS — K589 Irritable bowel syndrome without diarrhea: Secondary | ICD-10-CM | POA: Diagnosis present

## 2020-12-30 DIAGNOSIS — Z8249 Family history of ischemic heart disease and other diseases of the circulatory system: Secondary | ICD-10-CM | POA: Diagnosis not present

## 2020-12-30 DIAGNOSIS — E785 Hyperlipidemia, unspecified: Secondary | ICD-10-CM | POA: Diagnosis present

## 2020-12-30 DIAGNOSIS — Z20822 Contact with and (suspected) exposure to covid-19: Secondary | ICD-10-CM | POA: Diagnosis present

## 2020-12-30 DIAGNOSIS — Z9841 Cataract extraction status, right eye: Secondary | ICD-10-CM | POA: Diagnosis not present

## 2020-12-30 DIAGNOSIS — K219 Gastro-esophageal reflux disease without esophagitis: Secondary | ICD-10-CM | POA: Diagnosis present

## 2020-12-30 DIAGNOSIS — M5136 Other intervertebral disc degeneration, lumbar region: Secondary | ICD-10-CM | POA: Diagnosis present

## 2020-12-30 DIAGNOSIS — F419 Anxiety disorder, unspecified: Secondary | ICD-10-CM | POA: Diagnosis present

## 2020-12-30 LAB — TROPONIN I (HIGH SENSITIVITY)
Troponin I (High Sensitivity): 24 ng/L — ABNORMAL HIGH (ref <18)
Troponin I (High Sensitivity): 10 ng/L (ref <18)
Troponin I (High Sensitivity): 10 ng/L (ref <18)

## 2020-12-30 LAB — BASIC METABOLIC PANEL
Anion gap: 7 (ref 5–15)
BUN: 8 mg/dL (ref 8–23)
CO2: 23 mmol/L (ref 22–32)
Calcium: 8.1 mg/dL — ABNORMAL LOW (ref 8.9–10.3)
Chloride: 108 mmol/L (ref 98–111)
Creatinine, Ser: 0.5 mg/dL (ref 0.44–1.00)
GFR, Estimated: >60 mL/min (ref >60)
Glucose, Bld: 102 mg/dL — ABNORMAL HIGH (ref 70–99)
Potassium: 3.9 mmol/L (ref 3.5–5.1)
Sodium: 138 mmol/L (ref 135–145)

## 2020-12-30 LAB — CBC
HCT: 35.3 % — ABNORMAL LOW (ref 36.0–46.0)
Hemoglobin: 11.9 g/dL — ABNORMAL LOW (ref 12.0–15.0)
MCH: 30.4 pg (ref 26.0–34.0)
MCHC: 33.7 g/dL (ref 30.0–36.0)
MCV: 90.1 fL (ref 80.0–100.0)
Platelets: 258 10*3/uL (ref 150–400)
RBC: 3.92 MIL/uL (ref 3.87–5.11)
RDW: 13.2 % (ref 11.5–15.5)
WBC: 15.9 10*3/uL — ABNORMAL HIGH (ref 4.0–10.5)
nRBC: 0 % (ref 0.0–0.2)

## 2020-12-30 LAB — LACTIC ACID, PLASMA
Lactic Acid, Venous: 2 mmol/L (ref 0.5–1.9)
Lactic Acid, Venous: 1.2 mmol/L (ref 0.5–1.9)

## 2020-12-30 MED ORDER — AMLODIPINE BESYLATE 5 MG PO TABS
5.0000 mg | ORAL_TABLET | Freq: Every day | ORAL | Status: DC
  Administered 2020-12-30 – 2020-12-31 (×2): 5 mg via ORAL
  Filled 2020-12-30 (×2): qty 1

## 2020-12-30 MED ORDER — SODIUM CHLORIDE 0.9 % IV BOLUS
1000.0000 mL | Freq: Once | INTRAVENOUS | Status: AC
  Administered 2020-12-30: 03:00:00 1000 mL via INTRAVENOUS

## 2020-12-30 MED ORDER — ENOXAPARIN SODIUM 40 MG/0.4ML IJ SOSY
40.0000 mg | PREFILLED_SYRINGE | Freq: Every day | INTRAMUSCULAR | Status: DC
  Administered 2020-12-30: 10:00:00 40 mg via SUBCUTANEOUS
  Filled 2020-12-30: qty 0.4

## 2020-12-30 MED ORDER — IOHEXOL 300 MG/ML  SOLN
80.0000 mL | Freq: Once | INTRAMUSCULAR | Status: AC | PRN
  Administered 2020-12-30: 03:00:00 80 mL via INTRAVENOUS

## 2020-12-30 MED ORDER — PANTOPRAZOLE SODIUM 40 MG PO TBEC
40.0000 mg | DELAYED_RELEASE_TABLET | Freq: Two times a day (BID) | ORAL | Status: DC
  Administered 2020-12-30 – 2020-12-31 (×3): 40 mg via ORAL
  Filled 2020-12-30 (×2): qty 1

## 2020-12-30 MED ORDER — SENNOSIDES-DOCUSATE SODIUM 8.6-50 MG PO TABS: 1.0000 | ORAL_TABLET | Freq: Every evening | ORAL | Status: DC | PRN

## 2020-12-30 MED ORDER — IPRATROPIUM-ALBUTEROL 0.5-2.5 (3) MG/3ML IN SOLN: 3.0000 mL | RESPIRATORY_TRACT | Status: DC | PRN

## 2020-12-30 MED ORDER — LOSARTAN POTASSIUM 50 MG PO TABS
100.0000 mg | ORAL_TABLET | Freq: Every day | ORAL | Status: DC
  Administered 2020-12-30 – 2020-12-31 (×2): 100 mg via ORAL
  Filled 2020-12-30 (×2): qty 2

## 2020-12-30 MED ORDER — ALBUTEROL SULFATE HFA 108 (90 BASE) MCG/ACT IN AERS: 2.0000 | INHALATION_SPRAY | RESPIRATORY_TRACT | Status: DC | PRN

## 2020-12-30 MED ORDER — LORATADINE 10 MG PO TABS
10.0000 mg | ORAL_TABLET | Freq: Every day | ORAL | Status: DC
  Administered 2020-12-30 – 2020-12-31 (×2): 10 mg via ORAL
  Filled 2020-12-30 (×2): qty 1

## 2020-12-30 MED ORDER — ACETAMINOPHEN 325 MG PO TABS: 650.0000 mg | ORAL_TABLET | Freq: Four times a day (QID) | ORAL | Status: DC | PRN

## 2020-12-30 MED ORDER — METOPROLOL TARTRATE 5 MG/5ML IV SOLN: 5.0000 mg | INTRAVENOUS | Status: DC | PRN

## 2020-12-30 MED ORDER — ALBUTEROL SULFATE (2.5 MG/3ML) 0.083% IN NEBU: 2.5000 mg | INHALATION_SOLUTION | RESPIRATORY_TRACT | Status: DC | PRN

## 2020-12-30 MED ORDER — ONDANSETRON HCL 4 MG PO TABS: 4.0000 mg | ORAL_TABLET | Freq: Four times a day (QID) | ORAL | Status: DC | PRN

## 2020-12-30 MED ORDER — HYDRALAZINE HCL 20 MG/ML IJ SOLN: 10.0000 mg | INTRAMUSCULAR | Status: DC | PRN

## 2020-12-30 MED ORDER — ACETAMINOPHEN 650 MG RE SUPP: 650.0000 mg | Freq: Four times a day (QID) | RECTAL | Status: DC | PRN

## 2020-12-30 MED ORDER — OXYCODONE HCL 5 MG PO TABS: 5.0000 mg | ORAL_TABLET | ORAL | Status: DC | PRN

## 2020-12-30 MED ORDER — MONTELUKAST SODIUM 10 MG PO TABS
10.0000 mg | ORAL_TABLET | Freq: Every day | ORAL | Status: DC
  Administered 2020-12-30: 10 mg via ORAL
  Filled 2020-12-30 (×2): qty 1

## 2020-12-30 MED ORDER — ONDANSETRON HCL 4 MG/2ML IJ SOLN: 4.0000 mg | Freq: Four times a day (QID) | INTRAMUSCULAR | Status: DC | PRN

## 2020-12-30 MED ORDER — SODIUM CHLORIDE 0.9 % IV SOLN
1.0000 g | INTRAVENOUS | Status: DC
  Administered 2020-12-31: 1 g via INTRAVENOUS
  Filled 2020-12-30: qty 10

## 2020-12-30 MED ORDER — ACETAMINOPHEN 500 MG PO TABS
1000.0000 mg | ORAL_TABLET | Freq: Once | ORAL | Status: AC
  Administered 2020-12-30: 03:00:00 1000 mg via ORAL
  Filled 2020-12-30: qty 2

## 2020-12-30 MED ORDER — ONDANSETRON HCL 4 MG/2ML IJ SOLN
4.0000 mg | Freq: Once | INTRAMUSCULAR | Status: AC
  Administered 2020-12-30: 03:00:00 4 mg via INTRAVENOUS
  Filled 2020-12-30: qty 2

## 2020-12-30 MED ORDER — FLUTICASONE PROPIONATE 50 MCG/ACT NA SUSP
2.0000 | Freq: Every day | NASAL | Status: DC
  Administered 2020-12-30: 10:00:00 2 via NASAL
  Filled 2020-12-30: qty 16

## 2020-12-30 MED ORDER — TRAZODONE HCL 50 MG PO TABS
50.0000 mg | ORAL_TABLET | Freq: Every evening | ORAL | Status: DC | PRN
  Administered 2020-12-30: 50 mg via ORAL
  Filled 2020-12-30: qty 1

## 2020-12-30 MED ORDER — LACTATED RINGERS IV SOLN: INTRAVENOUS | Status: DC

## 2020-12-30 MED ORDER — SODIUM CHLORIDE 0.9 % IV SOLN
1.0000 g | Freq: Once | INTRAVENOUS | Status: AC
  Administered 2020-12-30: 05:00:00 1 g via INTRAVENOUS
  Filled 2020-12-30: qty 10

## 2020-12-30 NOTE — ED Provider Notes (Signed)
[facility] EMERGENCY DEPARTMENT Provider Note   CSN: [id number] Arrival date & time: 12/29/20  1907     History Chief Complaint  Patient presents with   Shaking   Shortness of Breath    Alexandra Gonzales is a 80 y.o. female.  79 yo F with a cc of not feeling well.  This been going on for about a week or so.  Family felt like she was uncomfortable and took her to see her family doctor to obtain the urine sample and felt her urine is infected and started on antibiotics.  She feels like the antibiotics made her much worse and she started having episodes where she shook all over and felt very cold.  She therefore stopped the antibiotics after about 3 or 4 days of treatment and continued to feel unwell.  Developed a cough today and feeling like her throat hurts.  She feels like her left ear hurts as well.  She denies any sick contacts.  Has chronic abdominal pain that she feels is unchanged.   Shortness of Breath Associated symptoms: abdominal pain (chronic and unchanged)   Associated symptoms: no chest pain, no fever, no headaches, no rash, no vomiting and no wheezing       Past Medical History:  Diagnosis Date   Allergy    Anxiety    Bilateral carotid artery stenosis    Breast cancer (HCC)    Bronchitis    Cancer (HCC)    questionable cancer to nipple   Chronic pain    Colon spasm    Diverticulitis    Dysphagia    Functional gait abnormality    GERD (gastroesophageal reflux disease)    Hyperlipidemia    Hyperlipidemia    Hypertension    IBS (irritable bowel syndrome)    Insomnia    Kidney stones    Low back pain    Lumbar radiculopathy    Myalgia    Neuropathy    Osteopenia    Personal history of radiation therapy    Sciatica    Sleep disorder     Patient Active Problem List   Diagnosis Date Noted   Complicated UTI (urinary tract infection) 12/30/2020   Leukocytosis 12/30/2020   Elevated troponin 12/30/2020   Essential hypertension 12/30/2020    Allergic rhinitis 12/30/2020   Facial spasm 02/29/2016   Esophageal reflux 02/18/2016   Neuropathy 02/18/2016    Past Surgical History:  Procedure Laterality Date   ABDOMINAL HYSTERECTOMY     BREAST EXCISIONAL BIOPSY     BREAST LUMPECTOMY Left    march 2000   BREAST SURGERY     R lumpectomy 2000   CATARACT EXTRACTION, BILATERAL     EXPLORATORY LAPAROTOMY     EXPLORATORY LAPAROTOMY     EYE SURGERY     bilat cataract removal   MOUTH SURGERY     RADIOACTIVE SEED GUIDED EXCISIONAL BREAST BIOPSY Left 09/26/2016   Procedure: LEFT RADIOACTIVE SEED GUIDED EXCISIONAL BREAST BIOPSY ERAS PATHWAY;  Surgeon: Rolm Bookbinder, MD;  Location: [facility];  Service: General;  Laterality: Left;  LMA     OB History     Gravida  2   Para  2   Term  2   Preterm  0   AB  0   Living  1      SAB  0   IAB  0   Ectopic  0   Multiple  0   Live Births  1  Family History  Problem Relation Age of Onset   Congestive Heart Failure Mother        Died 74   Kidney Stones Father        Died 48 - complications from kidney stone   Breast cancer Sister 21   Leukemia Maternal Uncle    Colon cancer Maternal Uncle     Social History   Tobacco Use   Smoking status: Never   Smokeless tobacco: Never  Vaping Use   Vaping Use: Never used  Substance Use Topics   Alcohol use: No   Drug use: No    Home Medications Prior to Admission medications   Medication Sig Start Date End Date Taking? Authorizing Provider  amLODipine (NORVASC) 5 MG tablet  03/14/18   [provider]  cetirizine (ZYRTEC) 10 MG tablet Take 1 tablet (10 mg total) by mouth daily. 11/07/18   Kozlow, Donnamarie Poag, MD  diazepam (VALIUM) 2 MG tablet Take 1 tablet (2 mg total) by mouth every 6 (six) hours as needed for muscle spasms. 04/30/20   Lacretia Leigh, MD  ibuprofen (ADVIL) 400 MG tablet Take 1 tablet (400 mg total) by mouth every 6 (six) hours as needed. 04/30/20   Lacretia Leigh, MD   ibuprofen (ADVIL) 400 MG tablet Take 1 tablet (400 mg total) by mouth every 6 (six) hours as needed. 04/30/20   Lacretia Leigh, MD  losartan (COZAAR) 100 MG tablet Take 100 mg by mouth daily. 04/23/19   [provider]  methocarbamol (ROBAXIN) 500 MG tablet Take 1 tablet (500 mg total) by mouth 2 (two) times daily. 04/30/20   Lacretia Leigh, MD  montelukast (SINGULAIR) 10 MG tablet Take 1 tablet (10 mg total) by mouth at bedtime. 08/08/18   Kozlow, Donnamarie Poag, MD  ondansetron (ZOFRAN ODT) 8 MG disintegrating tablet Take 1 tablet (8 mg total) by mouth every 8 (eight) hours as needed for nausea or vomiting. 04/30/20   Lacretia Leigh, MD  oxyCODONE-acetaminophen (PERCOCET/ROXICET) 5-325 MG tablet Take 1-2 tablets by mouth every 6 (six) hours as needed for severe pain. 04/30/20   Lacretia Leigh, MD  pantoprazole (PROTONIX) 20 MG tablet Take by mouth.    [provider]  predniSONE (DELTASONE) 10 MG tablet  10/31/18   [provider]  Probiotic Product (PROBIOTIC-10) CAPS Take by mouth.    [provider]    Allergies    Morphine and related, Codeine, Dexamethasone, Erythromycin, Hydrocodone, Lyrica [pregabalin], Prevacid [lansoprazole], Clarithromycin, Famotidine, Polytrim [polymyxin b-trimethoprim], Prednisone, Sulfa antibiotics, Zantac [ranitidine hcl], and Penicillins  Review of Systems   Review of Systems  Constitutional:  Positive for chills. Negative for fever.  HENT:  Negative for congestion and rhinorrhea.   Eyes:  Negative for redness and visual disturbance.  Respiratory:  Positive for shortness of breath. Negative for wheezing.   Cardiovascular:  Negative for chest pain and palpitations.  Gastrointestinal:  Positive for abdominal pain (chronic and unchanged). Negative for nausea and vomiting.  Genitourinary:  Negative for dysuria and urgency.  Musculoskeletal:  Positive for back pain (chronic and unchanged). Negative for arthralgias and myalgias.  Skin:   Negative for pallor, rash and wound.  Neurological:  Negative for dizziness and headaches.   Physical Exam Updated Vital Signs BP (!) 126/46    Pulse 68    Temp 98.5 F (36.9 C) (Oral)    Resp (!) 26    Ht 5\' 2"  (1.575 m)    Wt 63.5 kg    LMP  (LMP Unknown)  SpO2 97%    BMI 25.61 kg/m   Physical Exam Vitals and nursing note reviewed.  Constitutional:      General: She is not in acute distress.    Appearance: She is well-developed. She is not diaphoretic.  HENT:     Head: Normocephalic and atraumatic.  Eyes:     Pupils: Pupils are equal, round, and reactive to light.  Cardiovascular:     Rate and Rhythm: Normal rate and regular rhythm.     Heart sounds: No murmur heard.   No friction rub. No gallop.  Pulmonary:     Effort: Pulmonary effort is normal.     Breath sounds: No wheezing or rales.  Abdominal:     General: There is no distension.     Palpations: Abdomen is soft.     Tenderness: There is abdominal tenderness (worst to the ruq).  Musculoskeletal:        General: No tenderness.     Cervical back: Normal range of motion and neck supple.  Skin:    General: Skin is warm and dry.  Neurological:     Mental Status: She is alert and oriented to person, place, and time.  Psychiatric:        Behavior: Behavior normal.    ED Results / Procedures / Treatments   Labs (all labs ordered are listed, but only abnormal results are displayed) Labs Reviewed  CBC WITH DIFFERENTIAL/PLATELET - Abnormal; Notable for the following components:      Result Value   WBC 22.3 (*)    Neutro Abs 18.9 (*)    Monocytes Absolute 1.8 (*)    Abs Immature Granulocytes 0.11 (*)    All other components within normal limits  COMPREHENSIVE METABOLIC PANEL - Abnormal; Notable for the following components:   Glucose, Bld 143 (*)    All other components within normal limits  URINALYSIS, ROUTINE W REFLEX MICROSCOPIC - Abnormal; Notable for the following components:   APPearance HAZY (*)    Glucose,  UA >=500 (*)    Ketones, ur 5 (*)    Leukocytes,Ua SMALL (*)    Bacteria, UA FEW (*)    All other components within normal limits  LACTIC ACID, PLASMA - Abnormal; Notable for the following components:   Lactic Acid, Venous 2.0 (*)    All other components within normal limits  TROPONIN I (HIGH SENSITIVITY) - Abnormal; Notable for the following components:   Troponin I (High Sensitivity) 24 (*)    All other components within normal limits  RESP PANEL BY RT-PCR (FLU A&B, COVID) ARPGX2  CULTURE, BLOOD (ROUTINE X 2)  CULTURE, BLOOD (ROUTINE X 2)  URINE CULTURE  LACTIC ACID, PLASMA  BASIC METABOLIC PANEL  CBC  LACTIC ACID, PLASMA  TROPONIN I (HIGH SENSITIVITY)    EKG EKG Interpretation  Date/Time:  Tuesday December 29 2020 20:35:12 EST Ventricular Rate:  87 PR Interval:    QRS Duration: 134 QT Interval:  372 QTC Calculation: 447 R Axis:   26 Text Interpretation: Normal sinus rhythm Non-specific intra-ventricular conduction block Cannot rule out Anterior infarct , age undetermined Abnormal ECG No significant change since last tracing Confirmed by Deno Etienne [phone]) on 12/30/2020 1:55:52 AM  Radiology DG Chest 2 View  Result Date: 12/29/2020 CLINICAL DATA:  Shortness of breath for several days EXAM: CHEST - 2 VIEW COMPARISON:  03/16/2018 FINDINGS: Cardiac shadow is enlarged but stable. Lungs are well aerated bilaterally. Postsurgical changes in the right breast are seen. No acute bony abnormality is  noted. IMPRESSION: No acute abnormality noted. Electronically Signed   By: Inez Catalina M.D.   On: 12/29/2020 21:00   CT Head Wo Contrast  Result Date: 12/30/2020 CLINICAL DATA:  Headache, new or worsening. EXAM: CT HEAD WITHOUT CONTRAST TECHNIQUE: Contiguous axial images were obtained from the base of the skull through the vertex without intravenous contrast. COMPARISON:  01/09/2019. FINDINGS: Brain: No acute intracranial hemorrhage, midline shift or mass effect. No extra-axial fluid  collection. Mild atrophy is noted. Subcortical and periventricular white matter hypodensities are present bilaterally. There is no hydrocephalus. Vascular: Atherosclerotic calcification of the carotid siphons. No hyperdense vessel. Skull: Normal. Negative for fracture or focal lesion. Sinuses/Orbits: No acute finding. Other: None. IMPRESSION: 1. No acute intracranial process. 2. Mild atrophy with chronic microvascular ischemic changes. Electronically Signed   By: Brett Fairy M.D.   On: 12/30/2020 03:46   CT ABDOMEN PELVIS W CONTRAST  Result Date: 12/30/2020 CLINICAL DATA:  Abdominal pain, acute, nonlocalized. EXAM: CT ABDOMEN AND PELVIS WITH CONTRAST TECHNIQUE: Multidetector CT imaging of the abdomen and pelvis was performed using the standard protocol following bolus administration of intravenous contrast. CONTRAST:  87mL OMNIPAQUE IOHEXOL 300 MG/ML  SOLN COMPARISON:  12/13/2019. FINDINGS: Lower chest: Fibrotic changes and atelectasis are noted at the lung bases. Hepatobiliary: No focal liver abnormality is seen. No biliary ductal dilatation. A few stones are present within the gallbladder. Pancreas: Unremarkable. No pancreatic ductal dilatation or surrounding inflammatory changes. Spleen: Normal in size without focal abnormality. Adrenals/Urinary Tract: No adrenal nodule or mass. A 3 mm calculus is noted in the mid left kidney. Cysts are noted bilaterally. There are parapelvic cysts on the left. No ureteral calculus or obstructive uropathy. The bladder is unremarkable. Stomach/Bowel: There is a small hiatal hernia. No bowel obstruction, free air or pneumatosis. The appendix is not visualized on exam, however no inflammatory changes are identified in the right lower quadrant. No focal bowel wall thickening. Vascular/Lymphatic: Aortic atherosclerosis. No enlarged abdominal or pelvic lymph nodes. Reproductive: Status post hysterectomy. No adnexal masses. Other: No free fluid. Musculoskeletal: Degenerative  changes are present in the thoracolumbar spine. No acute osseous abnormality. IMPRESSION: 1. No acute intra-abdominal process. 2. Nonobstructive left renal calculus.  Bilateral renal cysts. 3. Cholelithiasis. 4. Aortic atherosclerosis. Electronically Signed   By: Brett Fairy M.D.   On: 12/30/2020 03:43    Procedures Procedures   Medications Ordered in ED Medications  amLODipine (NORVASC) tablet 5 mg (has no administration in time range)  losartan (COZAAR) tablet 100 mg (has no administration in time range)  pantoprazole (PROTONIX) EC tablet 40 mg (has no administration in time range)  loratadine (CLARITIN) tablet 10 mg (has no administration in time range)  montelukast (SINGULAIR) tablet 10 mg (has no administration in time range)  fluticasone (FLONASE) 50 MCG/ACT nasal spray 2 spray (has no administration in time range)  enoxaparin (LOVENOX) injection 40 mg (has no administration in time range)  lactated ringers infusion ( Intravenous New Bag/Given 12/30/20 0657)  acetaminophen (TYLENOL) tablet 650 mg (has no administration in time range)    Or  acetaminophen (TYLENOL) suppository 650 mg (has no administration in time range)  ondansetron (ZOFRAN) tablet 4 mg (has no administration in time range)    Or  ondansetron (ZOFRAN) injection 4 mg (has no administration in time range)  senna-docusate (Senokot-S) tablet 1 tablet (has no administration in time range)  cefTRIAXone (ROCEPHIN) 1 g in sodium chloride 0.9 % 100 mL IVPB (1 g Intravenous Not Given 12/30/20 0646)  albuterol (  PROVENTIL) (2.5 MG/3ML) 0.083% nebulizer solution 2.5 mg (has no administration in time range)  sodium chloride 0.9 % bolus 1,000 mL (0 mLs Intravenous Stopped 12/30/20 0537)  acetaminophen (TYLENOL) tablet 1,000 mg (1,000 mg Oral Given 12/30/20 0259)  ondansetron (ZOFRAN) injection 4 mg (4 mg Intravenous Given 12/30/20 0258)  iohexol (OMNIPAQUE) 300 MG/ML solution 80 mL (80 mLs Intravenous Contrast Given 12/30/20  0329)  cefTRIAXone (ROCEPHIN) 1 g in sodium chloride 0.9 % 100 mL IVPB (0 g Intravenous Stopped 12/30/20 6440)    ED Course  I have reviewed the triage vital signs and the nursing notes.  Pertinent labs & imaging results that were available during my care of the patient were reviewed by me and considered in my medical decision making (see chart for details).    MDM Rules/Calculators/A&P                         80 yo F with a chief complaints of feeling unwell.  Going on for the better part of a week.  She had seen her family doctor and had a UA that was concerning for a urinary tract infection and so started on Bactrim.  She then developed these episodes of feeling very cold and shaking which to me sounds most consistent with chills and rigors.  She denied cough until today.  She denies rash denies any change to her chronic abdominal discomfort.  She felt that her shakes were due to taking Bactrim.  She has a history of dementia and is somewhat confused on exam.  Not obviously different based on family discussion.  Patient found to have a significant leukocytosis of 22,000.  We will obtain his CT scan of the abdomen pelvis CT of the head.  Bolus of IV fluids blood cultures lactate reassess.  The patients results and plan were reviewed and discussed.   Any x-rays performed were independently reviewed by myself.   Differential diagnosis were considered with the presenting HPI.  Medications  amLODipine (NORVASC) tablet 5 mg (has no administration in time range)  losartan (COZAAR) tablet 100 mg (has no administration in time range)  pantoprazole (PROTONIX) EC tablet 40 mg (has no administration in time range)  loratadine (CLARITIN) tablet 10 mg (has no administration in time range)  montelukast (SINGULAIR) tablet 10 mg (has no administration in time range)  fluticasone (FLONASE) 50 MCG/ACT nasal spray 2 spray (has no administration in time range)  enoxaparin (LOVENOX) injection 40 mg (has no  administration in time range)  lactated ringers infusion ( Intravenous New Bag/Given 12/30/20 0657)  acetaminophen (TYLENOL) tablet 650 mg (has no administration in time range)    Or  acetaminophen (TYLENOL) suppository 650 mg (has no administration in time range)  ondansetron (ZOFRAN) tablet 4 mg (has no administration in time range)    Or  ondansetron (ZOFRAN) injection 4 mg (has no administration in time range)  senna-docusate (Senokot-S) tablet 1 tablet (has no administration in time range)  cefTRIAXone (ROCEPHIN) 1 g in sodium chloride 0.9 % 100 mL IVPB (1 g Intravenous Not Given 12/30/20 0646)  albuterol (PROVENTIL) (2.5 MG/3ML) 0.083% nebulizer solution 2.5 mg (has no administration in time range)  sodium chloride 0.9 % bolus 1,000 mL (0 mLs Intravenous Stopped 12/30/20 0537)  acetaminophen (TYLENOL) tablet 1,000 mg (1,000 mg Oral Given 12/30/20 0259)  ondansetron (ZOFRAN) injection 4 mg (4 mg Intravenous Given 12/30/20 0258)  iohexol (OMNIPAQUE) 300 MG/ML solution 80 mL (80 mLs Intravenous Contrast Given 12/30/20  7494)  cefTRIAXone (ROCEPHIN) 1 g in sodium chloride 0.9 % 100 mL IVPB (0 g Intravenous Stopped 12/30/20 0508)    Vitals:   12/30/20 0247 12/30/20 0301 12/30/20 0345 12/30/20 0430  BP:   (!) 138/48 (!) 126/46  Pulse:   65 68  Resp:   16 (!) 26  Temp: 98.5 F (36.9 C)     TempSrc: Oral     SpO2:   97% 97%  Weight:  63.5 kg    Height:  5\' 2"  (1.575 m)      Final diagnoses:  Acute cystitis without hematuria    Admission/ observation were discussed with the admitting physician, patient and/or family and they are comfortable with the plan.      Final Clinical Impression(s) / ED Diagnoses Final diagnoses:  Acute cystitis without hematuria    Rx / DC Orders ED Discharge Orders     None        Deno Etienne, DO 12/30/20 0708

## 2020-12-30 NOTE — ED Notes (Signed)
Provider at bedside

## 2020-12-30 NOTE — Progress Notes (Addendum)
80 year old with history of HTN, GERD, allergic rhinitis, lumbar spine degenerative disease, anxiety comes to the hospital for generally feeling unwell.  She was recently diagnosed of UTI but did not complete her outpatient antibiotic course.  Upon admission she was noted to have worsening of UTI, started on IV Rocephin.  CT head was negative.   Ambulating in her room with the help of her granddaughter.  She has slight burning with urination. Has some nausea, unable to consistently tolerate orals.   Vitals are stable.  Mild rhonchi at the bases but otherwise clear to auscultation.  Normal sinus rhythm.  Abdomen is nontender nondistended.  Assessment and plan:  Urinary tract infection - Did not complete her Bactrim course outpatient, will send urine cultures here.  Start empiric IV Rocephin, IV fluids.  Supportive care.  CT scan shows nonobstructive left-sided renal stone. She is unable to consistently tolerate oral therefore keep her on IV Abx.   Elevated troponin - Demand ischemia  GERD - PPI  Allergic rhinitis - Supportive care  Essential hypertension - Norvasc, losartan  Gerlean Ren MD [facility]

## 2020-12-30 NOTE — ED Notes (Signed)
Admit provider at bedside 

## 2020-12-30 NOTE — ED Notes (Signed)
Verbal report given to Abram Sander RN at this time

## 2020-12-30 NOTE — ED Notes (Signed)
Ambulated to bathroom with steady gait

## 2020-12-30 NOTE — H&P (Signed)
History and Physical    Alexandra Gonzales LFY:[id number] DOB: [birth year] DOA: 12/29/2020  PCP: Alexandra Frees, MD   Patient coming from: Home  Chief Complaint: Not feeling well, cough, lower abdominal pain  HPI: Alexandra Gonzales is a 80 y.o. female with medical history significant for HTN, GERD, allergic rhinitis, DDD lumbar spine, anxiety who presents for evaluation of not feeling well.  She states for the last week she has not felt very well and has had some lower abdominal pain as well as urinary frequency and some dysuria.  She went to see her PCP and had a urinalysis and was told she had a UTI and was started on antibiotics.  She took 3 days of antibiotics but states this made her stomach feel worse so she stopped taking them.  She reports she has been having episodes of chills and shaking and feeling very cold for the last few days.  She has had a dry nonproductive cough.  She reports she has some throat irritation and feels like she has congestion in her nose that is running down the back of her throat.  Her ears have felt full but have not hurt.  She denies any sore throat and has not had a fever when she checked her temperature.  She denies having any nausea or vomiting.  She has not had any rash.  No known sick contacts. Denies tobacco alcohol or illicit drug use.  ED Course: In the emergency room Alexandra Gonzales has been hemodynamically stable.  T-max 99.3 degrees. Lab work reveals WBC of 20,300 hemoglobin 13.8 hematocrit 41 platelets 272,000, troponin 24, lactic acid 2. Sodium 137 potassium 3.7 chloride 103 bicarb 24 creatinine 0.63 BUN 12 glucose 143 alkaline phosphatase 74 AST 23 ALT 20.  Urinalysis is yellow with greater than 500 glucose small ketones small leukocytes negative nitrite.  This is partially treated urine as she took a partial dose of her antibiotic course for UTI.  CT of the head showed no acute intracranial abnormality.  Chest x-ray showed no infiltrate consolidation or pulmonary  edema.  Urine culture sent to the lab. Hospitalist service asked to admit patient for further management  Review of Systems:  General: Denies fever, chills, weight loss, night sweats.  Denies dizziness. Reports decreased appetite HENT: Denies head trauma, headache, denies change in hearing, tinnitus. Reports nasal congestion with clear drainage. No nasal bleeding.  Denies sore throat Eyes: Denies blurry vision, pain in eye, drainage.  Denies discoloration of eyes. Neck: Denies pain.  Denies swelling.  Denies pain with movement. Cardiovascular: Denies chest pain, palpitations.  Denies edema.  Denies orthopnea Respiratory: Reports shortness of breath with exertion and dry cough.  Denies wheezing.  Denies sputum production Gastrointestinal: Reports abdominal pain, swelling.  Denies nausea, vomiting, diarrhea.  Denies melena.  Denies hematemesis. Musculoskeletal: Denies limitation of movement.  Denies deformity or swelling.  Denies pain.  Denies arthralgias or myalgias. Genitourinary: Denies pelvic pain.  Reports urinary frequency and dysuria.  Skin: Denies rash.  Denies petechiae, purpura, ecchymosis. Neurological:Denies syncope.  Denies seizure activity. Denies slurred speech, drooping face. Denies visual change. Psychiatric: Denies depression, anxiety. Denies hallucinations.  Past Medical History:  Diagnosis Date   Allergy    Anxiety    Bilateral carotid artery stenosis    Breast cancer (HCC)    Bronchitis    Cancer ([place])    questionable cancer to nipple   Chronic pain    Colon spasm    Diverticulitis    Dysphagia  Functional gait abnormality    GERD (gastroesophageal reflux disease)    Hyperlipidemia    Hyperlipidemia    Hypertension    IBS (irritable bowel syndrome)    Insomnia    Kidney stones    Low back pain    Lumbar radiculopathy    Myalgia    Neuropathy    Osteopenia    Personal history of radiation therapy    Sciatica    Sleep disorder     Past Surgical  History:  Procedure Laterality Date   ABDOMINAL HYSTERECTOMY     BREAST EXCISIONAL BIOPSY     BREAST LUMPECTOMY Left    march 2000   BREAST SURGERY     R lumpectomy 2000   CATARACT EXTRACTION, BILATERAL     EXPLORATORY LAPAROTOMY     EXPLORATORY LAPAROTOMY     EYE SURGERY     bilat cataract removal   MOUTH SURGERY     RADIOACTIVE SEED GUIDED EXCISIONAL BREAST BIOPSY Left 09/26/2016   Procedure: LEFT RADIOACTIVE SEED GUIDED EXCISIONAL BREAST BIOPSY ERAS PATHWAY;  Surgeon: Alexandra Bookbinder, MD;  Location: [place];  Service: General;  Laterality: Left;  LMA    Social History  reports that she has never smoked. She has never used smokeless tobacco. She reports that she does not drink alcohol and does not use drugs.  Allergies  Allergen Reactions   Morphine And Related Other (See Comments)    hallucinations   Codeine Nausea And Vomiting   Dexamethasone Nausea Only   Erythromycin Rash   Hydrocodone Nausea Only   Lyrica [Pregabalin] Nausea And Vomiting   Prevacid [Lansoprazole] Photosensitivity   Clarithromycin    Famotidine     Affected memory   Polytrim [Polymyxin B-Trimethoprim] Other (See Comments)    Sores in mouth, decreases memory   Prednisone Other (See Comments)    Caused neck swelling   Sulfa Antibiotics Other (See Comments)    Fatigue, shaking, headache - patient believes it may be an allergy   Zantac [Ranitidine Hcl]     Affected memory   Penicillins Rash    Family History  Problem Relation Age of Onset   Congestive Heart Failure Mother        Died 74   Kidney Stones Father        Died 48 - complications from kidney stone   Breast cancer Sister 13   Leukemia Maternal Uncle    Colon cancer Maternal Uncle      Prior to Admission medications   Medication Sig Start Date End Date Taking? Authorizing Provider  amLODipine (NORVASC) 5 MG tablet  03/14/18   [provider]  cetirizine (ZYRTEC) 10 MG tablet Take 1 tablet (10 mg total)  by mouth daily. 11/07/18   Gonzales, Alexandra Poag, MD  diazepam (VALIUM) 2 MG tablet Take 1 tablet (2 mg total) by mouth every 6 (six) hours as needed for muscle spasms. 04/30/20   Alexandra Leigh, MD  ibuprofen (ADVIL) 400 MG tablet Take 1 tablet (400 mg total) by mouth every 6 (six) hours as needed. 04/30/20   Alexandra Leigh, MD  ibuprofen (ADVIL) 400 MG tablet Take 1 tablet (400 mg total) by mouth every 6 (six) hours as needed. 04/30/20   Alexandra Leigh, MD  losartan (COZAAR) 100 MG tablet Take 100 mg by mouth daily. 04/23/19   [provider]  methocarbamol (ROBAXIN) 500 MG tablet Take 1 tablet (500 mg total) by mouth 2 (two) times daily. 04/30/20   Alexandra Leigh, MD  montelukast (SINGULAIR) 10 MG tablet Take 1 tablet (10 mg total) by mouth at bedtime. 08/08/18   Gonzales, Alexandra Poag, MD  ondansetron (ZOFRAN ODT) 8 MG disintegrating tablet Take 1 tablet (8 mg total) by mouth every 8 (eight) hours as needed for nausea or vomiting. 04/30/20   Alexandra Leigh, MD  oxyCODONE-acetaminophen (PERCOCET/ROXICET) 5-325 MG tablet Take 1-2 tablets by mouth every 6 (six) hours as needed for severe pain. 04/30/20   Alexandra Leigh, MD  pantoprazole (PROTONIX) 20 MG tablet Take by mouth.    [provider]  predniSONE (DELTASONE) 10 MG tablet  10/31/18   [provider]  Probiotic Product (PROBIOTIC-10) CAPS Take by mouth.    [provider]    Physical Exam: Vitals:   12/30/20 0247 12/30/20 0301 12/30/20 0345 12/30/20 0430  BP:   (!) 138/48 (!) 126/46  Pulse:   65 68  Resp:   16 (!) 26  Temp: 98.5 F (36.9 C)     TempSrc: Oral     SpO2:   97% 97%  Weight:  63.5 kg    Height:  5\' 2"  (1.575 m)      Constitutional: NAD, calm, comfortable Vitals:   12/30/20 0247 12/30/20 0301 12/30/20 0345 12/30/20 0430  BP:   (!) 138/48 (!) 126/46  Pulse:   65 68  Resp:   16 (!) 26  Temp: 98.5 F (36.9 C)     TempSrc: Oral     SpO2:   97% 97%  Weight:  63.5 kg    Height:  5\' 2"  (1.575 m)      General: WDWN, Alert and oriented x3.  Eyes: EOMI, PERRL, conjunctivae normal. Sclera nonicteric HENT:  [place]/AT, external ears normal.  Nares patent without epistasis.  Mucous membranes are moist. Posterior pharynx with clear post nasal drip.  Neck: Soft, normal range of motion, supple, no masses, Trachea midline Respiratory: clear to auscultation bilaterally, no wheezing, no crackles. Normal respiratory effort. No accessory muscle use.  Cardiovascular: Regular rate and rhythm, no murmurs / rubs / gallops. No extremity edema. 1+ pedal pulses. Abdomen: Soft, no tenderness, nondistended, no rebound or guarding.  No masses palpated.  Bowel sounds normoactive. No CVA tenderness to percussion Musculoskeletal: FROM. no cyanosis. No joint deformity upper and lower extremities. Normal muscle tone.  Skin: Warm, dry, intact no rashes, lesions, ulcers. No induration Neurologic: CN 2-12 grossly intact.  Normal speech.  Sensation intact, Strength 5/5 in all extremities.   Psychiatric: Normal mood and affect.    Labs on Admission: I have personally reviewed following labs and imaging studies  CBC: Recent Labs  Lab 12/29/20 2038  WBC 22.3*  NEUTROABS 18.9*  HGB 13.8  HCT 41.0  MCV 89.9  PLT 829    Basic Metabolic Panel: Recent Labs  Lab 12/29/20 2038  NA 137  K 3.7  CL 103  CO2 24  GLUCOSE 143*  BUN 12  CREATININE 0.63  CALCIUM 9.3    GFR: Estimated Creatinine Clearance: 49.1 mL/min (by C-G formula based on SCr of 0.63 mg/dL).  Liver Function Tests: Recent Labs  Lab 12/29/20 2038  AST 23  ALT 20  ALKPHOS 74  BILITOT 0.6  PROT 6.6  ALBUMIN 3.8    Urine analysis:    Component Value Date/Time   COLORURINE YELLOW 12/29/2020 2015   APPEARANCEUR HAZY (A) 12/29/2020 2015   LABSPEC 1.014 12/29/2020 2015   PHURINE 5.0 12/29/2020 2015   GLUCOSEU >=500 (A) 12/29/2020 2015   [place] NEGATIVE 12/29/2020 2015  BILIRUBINUR NEGATIVE 12/29/2020 2015   BILIRUBINUR n 05/22/2019 1153    KETONESUR 5 (A) 12/29/2020 2015   PROTEINUR NEGATIVE 12/29/2020 2015   UROBILINOGEN 0.2 05/22/2019 1153   UROBILINOGEN 0.2 07/08/2018 1810   NITRITE NEGATIVE 12/29/2020 2015   LEUKOCYTESUR SMALL (A) 12/29/2020 2015    Radiological Exams on Admission: DG Chest 2 View  Result Date: 12/29/2020 CLINICAL DATA:  Shortness of breath for several days EXAM: CHEST - 2 VIEW COMPARISON:  03/16/2018 FINDINGS: Cardiac shadow is enlarged but stable. Lungs are well aerated bilaterally. Postsurgical changes in the right breast are seen. No acute bony abnormality is noted. IMPRESSION: No acute abnormality noted. Electronically Signed   By: Inez Catalina M.D.   On: 12/29/2020 21:00   CT Head Wo Contrast  Result Date: 12/30/2020 CLINICAL DATA:  Headache, new or worsening. EXAM: CT HEAD WITHOUT CONTRAST TECHNIQUE: Contiguous axial images were obtained from the base of the skull through the vertex without intravenous contrast. COMPARISON:  01/09/2019. FINDINGS: Brain: No acute intracranial hemorrhage, midline shift or mass effect. No extra-axial fluid collection. Mild atrophy is noted. Subcortical and periventricular white matter hypodensities are present bilaterally. There is no hydrocephalus. Vascular: Atherosclerotic calcification of the carotid siphons. No hyperdense vessel. Skull: Normal. Negative for fracture or focal lesion. Sinuses/Orbits: No acute finding. Other: None. IMPRESSION: 1. No acute intracranial process. 2. Mild atrophy with chronic microvascular ischemic changes. Electronically Signed   By: Brett Fairy M.D.   On: 12/30/2020 03:46   CT ABDOMEN PELVIS W CONTRAST  Result Date: 12/30/2020 CLINICAL DATA:  Abdominal pain, acute, nonlocalized. EXAM: CT ABDOMEN AND PELVIS WITH CONTRAST TECHNIQUE: Multidetector CT imaging of the abdomen and pelvis was performed using the standard protocol following bolus administration of intravenous contrast. CONTRAST:  26mL OMNIPAQUE IOHEXOL 300 MG/ML  SOLN  COMPARISON:  12/13/2019. FINDINGS: Lower chest: Fibrotic changes and atelectasis are noted at the lung bases. Hepatobiliary: No focal liver abnormality is seen. No biliary ductal dilatation. A few stones are present within the gallbladder. Pancreas: Unremarkable. No pancreatic ductal dilatation or surrounding inflammatory changes. Spleen: Normal in size without focal abnormality. Adrenals/Urinary Tract: No adrenal nodule or mass. A 3 mm calculus is noted in the mid left kidney. Cysts are noted bilaterally. There are parapelvic cysts on the left. No ureteral calculus or obstructive uropathy. The bladder is unremarkable. Stomach/Bowel: There is a small hiatal hernia. No bowel obstruction, free air or pneumatosis. The appendix is not visualized on exam, however no inflammatory changes are identified in the right lower quadrant. No focal bowel wall thickening. Vascular/Lymphatic: Aortic atherosclerosis. No enlarged abdominal or pelvic lymph nodes. Reproductive: Status post hysterectomy. No adnexal masses. Other: No free fluid. Musculoskeletal: Degenerative changes are present in the thoracolumbar spine. No acute osseous abnormality. IMPRESSION: 1. No acute intra-abdominal process. 2. Nonobstructive left renal calculus.  Bilateral renal cysts. 3. Cholelithiasis. 4. Aortic atherosclerosis. Electronically Signed   By: Brett Fairy M.D.   On: 12/30/2020 03:43    EKG: Independently reviewed.  EKG shows normal sinus rhythm with no acute ST elevation or depression.  QTc 447  Assessment/Plan Principal Problem:   Complicated UTI (urinary tract infection) Ms. Naves is admitted to Med Surg floor. She was recently diagnosised with UTI by her PCP and took three days of antibiotic (Bactrim) but stopped it before completing the course. She has elevated WBC and still doesn't feel well which could be result of not fully treating the UTI. Urine culture sent by ER and will be monitored.  Started on  Rocephin IVF hydration with LR  at 75 ml/hr Recheck CBC, BMP in am  Active Problems:   Elevated troponin No history of chest pain or palpitations.  EKG shows no ischemic changes.  Troponin was mildly elevated.  We will trend serial troponins    Essential hypertension Continue home dose of Norvasc and Cozaar.  Monitor blood pressure    Leukocytosis BC of 20,000.  Recheck CBC this morning Has been placed on Rocephin for antibiotic coverage    Esophageal reflux Continue PPI therapy with Protonix twice daily    Allergic rhinitis Continue Singulair.  Claritin substituted for Zyrtec.  Start Flonase once a day to nares to assist with control of allergic rhinitis and postnasal drip  DVT prophylaxis: Lovenox for DVT prophylaxis.   Code Status:   Full Code  Family Communication:  Diagnosis and plan discussed with patient and family members at bedside.  They verbalized understanding and agree with plan.  Questions answered.  Further recommendations to follow as clinical indicated Disposition Plan:   Patient is from:  Home  Anticipated DC to:  Home  Anticipated DC date:  Anticipate 2 midnight stay to treat acute condition  Time spent on admission:        50 minutes   Admission status:  Inpatient  Yevonne Aline Nealie Mchatton MD [facility]  How to contact the [facility] Attending or Consulting provider [place] or covering provider during after hours [place], for this patient?   Check the care team in [facility] and look for a) attending/consulting TRH provider listed and b) the [facility] team listed Log into [email or web address] and use [place]'s universal password to access. If you do not have the password, please contact the hospital operator. Locate the [facility] provider you are looking for under [facility] and page to a number that you can be directly reached. If you still have difficulty reaching the provider, please page the [facility] (Director on Call) for the Hospitalists listed on amion for assistance.  12/30/2020, 4:54 AM

## 2020-12-30 NOTE — ED Notes (Signed)
Patient transported to CT 

## 2020-12-30 NOTE — ED Notes (Signed)
Ambulated to restroom without assistance at this time 

## 2020-12-31 ENCOUNTER — Other Ambulatory Visit (HOSPITAL_COMMUNITY): Payer: Self-pay

## 2020-12-31 LAB — BASIC METABOLIC PANEL
Anion gap: 7 (ref 5–15)
BUN: 8 mg/dL (ref 8–23)
CO2: 24 mmol/L (ref 22–32)
Calcium: 8.5 mg/dL — ABNORMAL LOW (ref 8.9–10.3)
Chloride: 107 mmol/L (ref 98–111)
Creatinine, Ser: 0.61 mg/dL (ref 0.44–1.00)
GFR, Estimated: >60 mL/min (ref >60)
Glucose, Bld: 98 mg/dL (ref 70–99)
Potassium: 3.9 mmol/L (ref 3.5–5.1)
Sodium: 138 mmol/L (ref 135–145)

## 2020-12-31 LAB — URINE CULTURE: Culture: NO GROWTH

## 2020-12-31 LAB — CBC
HCT: 34.6 % — ABNORMAL LOW (ref 36.0–46.0)
Hemoglobin: 11.3 g/dL — ABNORMAL LOW (ref 12.0–15.0)
MCH: 29.4 pg (ref 26.0–34.0)
MCHC: 32.7 g/dL (ref 30.0–36.0)
MCV: 89.9 fL (ref 80.0–100.0)
Platelets: 238 10*3/uL (ref 150–400)
RBC: 3.85 MIL/uL — ABNORMAL LOW (ref 3.87–5.11)
RDW: 13.4 % (ref 11.5–15.5)
WBC: 8.2 10*3/uL (ref 4.0–10.5)
nRBC: 0 % (ref 0.0–0.2)

## 2020-12-31 LAB — MAGNESIUM: Magnesium: 2.3 mg/dL (ref 1.7–2.4)

## 2020-12-31 MED ORDER — CEFDINIR 300 MG PO CAPS
300.0000 mg | ORAL_CAPSULE | Freq: Two times a day (BID) | ORAL | 0 refills | Status: DC
  Filled 2020-12-31: qty 12, 6d supply, fill #0

## 2020-12-31 MED ORDER — CEFPODOXIME PROXETIL 200 MG PO TABS
200.0000 mg | ORAL_TABLET | Freq: Two times a day (BID) | ORAL | 0 refills | Status: DC
  Filled 2020-12-31: qty 12, 6d supply, fill #0

## 2020-12-31 NOTE — TOC Transition Note (Signed)
Transition of Care [facility]) - CM/SW Discharge Note   Patient Details  Name: Alexandra Gonzales MRN: [id number] Date of Birth: [birth year]  Transition of Care [facility]) CM/SW Contact:  Tom-Johnson, Renea Ee, RN Phone Number: 12/31/2020, 10:21 AM   Clinical Narrative:     Transition of Care (TOC) Screening Note   Patient Details  Name: Alexandra Gonzales Date of Birth: 04-21-1940   Transition of Care [facility]) CM/SW Contact:    Tom-Johnson, Renea Ee, RN Phone Number: 12/31/2020, 10:21 AM  Patient is scheduled for discharge today. From home with family. Admitted and treated for UTI. Transition of Care Department [facility]) has reviewed patient and no TOC needs have been identified at this time. Family to transport at discharge. No further TOC needs noted.  Final next level of care: Home/Self Care Barriers to Discharge: Barriers Resolved   Patient Goals and CMS Choice Patient states their goals for this hospitalization and ongoing recovery are:: To go home CMS Medicare.gov Compare Post Acute Care list provided to:: Patient Choice offered to / list presented to : NA  Discharge Placement                       Discharge Plan and Services                DME Arranged: N/A DME Agency: NA       HH Arranged: NA HH Agency: NA        Social Determinants of Health (SDOH) Interventions     Readmission Risk Interventions No flowsheet data found.

## 2020-12-31 NOTE — Discharge Summary (Signed)
Physician Discharge Summary  Alexandra Gonzales IWP:[id number] DOB: August 05, 1940 DOA: 12/29/2020  PCP: Shirline Frees, MD  Admit date: 12/29/2020 Discharge date: 12/31/2020  Admitted From: Home Disposition: Home  Recommendations for Outpatient Follow-up:  Follow up with PCP in 1-2 weeks Please obtain BMP/CBC in one week your next doctors visit.  Cefdinir 300 mg twice daily   Discharge Condition: Stable CODE STATUS: Full code Diet recommendation: 2 g salt  Brief/Interim Summary: 80 year old with history of HTN, GERD, allergic rhinitis, lumbar spine degenerative disease, anxiety comes to the hospital for generally feeling unwell.  She was recently diagnosed of UTI but did not complete her outpatient antibiotic course.  Upon admission she was noted to have worsening of UTI, started on IV Rocephin.  CT head was negative.  During hospitalization her nausea improved and she was tolerating orals.  She was ambulating without any issues after getting IV hydration and antibiotics.  Her leukocytosis resolved.  She was transitioned to oral cefdinir upon discharge.  Family present at bedside during my interaction on the day of discharge.   Urinary tract infection - She did not tolerate oral Bactrim well therefore required IV Rocephin in the hospital.  Her leukocytosis resolved.  We will transition to oral cefdinir upon discharge.  Finally she is tolerating orals today   Elevated troponin - Demand ischemia   GERD - PPI   Allergic rhinitis - Supportive care   Essential hypertension - Norvasc, losartan    Body mass index is 25.61 kg/m.         Discharge Diagnoses:  Principal Problem:   Complicated UTI (urinary tract infection) Active Problems:   Esophageal reflux   Leukocytosis   Elevated troponin   Essential hypertension   Allergic rhinitis     Subjective: Feeling okay no complaints, ambulating in the room without any issues.  Family is present at bedside.  Discharge  Exam: Vitals:   12/31/20 0420 12/31/20 0907  BP: (!) 143/58 (!) 147/69  Pulse: 62 67  Resp: 20 18  Temp: 97.9 F (36.6 C) 98.6 F (37 C)  SpO2: 94% 94%   Vitals:   12/30/20 1619 12/30/20 2139 12/31/20 0420 12/31/20 0907  BP: (!) 164/84 (!) 150/55 (!) 143/58 (!) 147/69  Pulse: (!) 108 60 62 67  Resp: 16 18 20 18   Temp: 98 F (36.7 C) 97.7 F (36.5 C) 97.9 F (36.6 C) 98.6 F (37 C)  TempSrc: Oral Oral Oral   SpO2: 98% 94% 94% 94%  Weight:      Height:        General: Pt is alert, awake, not in acute distress Cardiovascular: RRR, S1/S2 +, no rubs, no gallops Respiratory: CTA bilaterally, no wheezing, no rhonchi Abdominal: Soft, NT, ND, bowel sounds + Extremities: no edema, no cyanosis  Discharge Instructions   Allergies as of 12/31/2020       Reactions   Morphine And Related Other (See Comments)   hallucinations   Codeine Nausea And Vomiting   Dexamethasone Nausea Only   Erythromycin Rash   Hydrocodone Nausea Only   Lyrica [pregabalin] Nausea And Vomiting   Prevacid [lansoprazole] Photosensitivity   Clarithromycin    Famotidine    Affected memory   Latex Itching   Nitrofuran Derivatives    Shaking, felt bad   Polytrim [polymyxin B-trimethoprim] Other (See Comments)   Sores in mouth, decreases memory   Prednisone Other (See Comments)   Caused neck swelling   Sulfa Antibiotics Other (See Comments)   Fatigue, shaking, headache -  patient believes it may be an allergy   Zantac [ranitidine Hcl]    Affected memory   Penicillins Rash        Medication List     TAKE these medications    amLODipine 5 MG tablet Commonly known as: NORVASC Take 5 mg by mouth at bedtime.   cefdinir 300 MG capsule Commonly known as: OMNICEF Take 1 capsule (300 mg total) by mouth 2 (two) times daily.   loratadine 10 MG tablet Commonly known as: CLARITIN Take 10 mg by mouth daily.   losartan 100 MG tablet Commonly known as: COZAAR Take 100 mg by mouth daily.    montelukast 10 MG tablet Commonly known as: SINGULAIR Take 1 tablet (10 mg total) by mouth at bedtime.   pantoprazole 20 MG tablet Commonly known as: PROTONIX Take 20 mg by mouth 2 (two) times daily.   Probiotic-10 Caps Take 1 capsule by mouth daily.   REFRESH OP Place 1 drop into both eyes daily as needed (dry eyes).   traZODone 50 MG tablet Commonly known as: DESYREL Take 25 mg by mouth at bedtime as needed for sleep.        Follow-up Information     Shirline Frees, MD Follow up in 1 month(s).   Specialty: Family Medicine Contact information: [place] [address] [phone]                Allergies  Allergen Reactions   Morphine And Related Other (See Comments)    hallucinations   Codeine Nausea And Vomiting   Dexamethasone Nausea Only   Erythromycin Rash   Hydrocodone Nausea Only   Lyrica [Pregabalin] Nausea And Vomiting   Prevacid [Lansoprazole] Photosensitivity   Clarithromycin    Famotidine     Affected memory   Latex Itching   Nitrofuran Derivatives     Shaking, felt bad   Polytrim [Polymyxin B-Trimethoprim] Other (See Comments)    Sores in mouth, decreases memory   Prednisone Other (See Comments)    Caused neck swelling   Sulfa Antibiotics Other (See Comments)    Fatigue, shaking, headache - patient believes it may be an allergy   Zantac [Ranitidine Hcl]     Affected memory   Penicillins Rash    You were cared for by a hospitalist during your hospital stay. If you have any questions about your discharge medications or the care you received while you were in the hospital after you are discharged, you can call the unit and asked to speak with the hospitalist on call if the hospitalist that took care of you is not available. Once you are discharged, your primary care physician will handle any further medical issues. Please note that no refills for any discharge medications will be authorized once you are  discharged, as it is imperative that you return to your primary care physician (or establish a relationship with a primary care physician if you do not have one) for your aftercare needs so that they can reassess your need for medications and monitor your lab values.   Procedures/Studies: DG Chest 2 View  Result Date: 12/29/2020 CLINICAL DATA:  Shortness of breath for several days EXAM: CHEST - 2 VIEW COMPARISON:  03/16/2018 FINDINGS: Cardiac shadow is enlarged but stable. Lungs are well aerated bilaterally. Postsurgical changes in the right breast are seen. No acute bony abnormality is noted. IMPRESSION: No acute abnormality noted. Electronically Signed   By: Inez Catalina M.D.   On: 12/29/2020 21:00  CT Head Wo Contrast  Result Date: 12/30/2020 CLINICAL DATA:  Headache, new or worsening. EXAM: CT HEAD WITHOUT CONTRAST TECHNIQUE: Contiguous axial images were obtained from the base of the skull through the vertex without intravenous contrast. COMPARISON:  01/09/2019. FINDINGS: Brain: No acute intracranial hemorrhage, midline shift or mass effect. No extra-axial fluid collection. Mild atrophy is noted. Subcortical and periventricular white matter hypodensities are present bilaterally. There is no hydrocephalus. Vascular: Atherosclerotic calcification of the carotid siphons. No hyperdense vessel. Skull: Normal. Negative for fracture or focal lesion. Sinuses/Orbits: No acute finding. Other: None. IMPRESSION: 1. No acute intracranial process. 2. Mild atrophy with chronic microvascular ischemic changes. Electronically Signed   By: Brett Fairy M.D.   On: 12/30/2020 03:46   CT ABDOMEN PELVIS W CONTRAST  Result Date: 12/30/2020 CLINICAL DATA:  Abdominal pain, acute, nonlocalized. EXAM: CT ABDOMEN AND PELVIS WITH CONTRAST TECHNIQUE: Multidetector CT imaging of the abdomen and pelvis was performed using the standard protocol following bolus administration of intravenous contrast. CONTRAST:  73mL OMNIPAQUE  IOHEXOL 300 MG/ML  SOLN COMPARISON:  12/13/2019. FINDINGS: Lower chest: Fibrotic changes and atelectasis are noted at the lung bases. Hepatobiliary: No focal liver abnormality is seen. No biliary ductal dilatation. A few stones are present within the gallbladder. Pancreas: Unremarkable. No pancreatic ductal dilatation or surrounding inflammatory changes. Spleen: Normal in size without focal abnormality. Adrenals/Urinary Tract: No adrenal nodule or mass. A 3 mm calculus is noted in the mid left kidney. Cysts are noted bilaterally. There are parapelvic cysts on the left. No ureteral calculus or obstructive uropathy. The bladder is unremarkable. Stomach/Bowel: There is a small hiatal hernia. No bowel obstruction, free air or pneumatosis. The appendix is not visualized on exam, however no inflammatory changes are identified in the right lower quadrant. No focal bowel wall thickening. Vascular/Lymphatic: Aortic atherosclerosis. No enlarged abdominal or pelvic lymph nodes. Reproductive: Status post hysterectomy. No adnexal masses. Other: No free fluid. Musculoskeletal: Degenerative changes are present in the thoracolumbar spine. No acute osseous abnormality. IMPRESSION: 1. No acute intra-abdominal process. 2. Nonobstructive left renal calculus.  Bilateral renal cysts. 3. Cholelithiasis. 4. Aortic atherosclerosis. Electronically Signed   By: Brett Fairy M.D.   On: 12/30/2020 03:43     The results of significant diagnostics from this hospitalization (including imaging, microbiology, ancillary and laboratory) are listed below for reference.     Microbiology: Recent Results (from the past 240 hour(s))  Resp Panel by RT-PCR (Flu A&B, Covid) Urine, Clean Catch     Status: None   Collection Time: 12/29/20  8:15 PM   Specimen: Urine, Clean Catch; Nasopharyngeal(NP) swabs in vial transport medium  Result Value Ref Range Status   SARS Coronavirus 2 by RT PCR NEGATIVE NEGATIVE Final    Comment: (NOTE) SARS-CoV-2  target nucleic acids are NOT DETECTED.  The SARS-CoV-2 RNA is generally detectable in upper respiratory specimens during the acute phase of infection. The lowest concentration of SARS-CoV-2 viral copies this assay can detect is 138 copies/mL. A negative result does not preclude SARS-Cov-2 infection and should not be used as the sole basis for treatment or other patient management decisions. A negative result may occur with  improper specimen collection/handling, submission of specimen other than nasopharyngeal swab, presence of viral mutation(s) within the areas targeted by this assay, and inadequate number of viral copies(<138 copies/mL). A negative result must be combined with clinical observations, patient history, and epidemiological information. The expected result is Negative.  Fact Sheet for Patients:  EntrepreneurPulse.com.au  Fact Sheet for Healthcare  Providers:  IncredibleEmployment.be  This test is no t yet approved or cleared by the [place] and  has been authorized for detection and/or diagnosis of SARS-CoV-2 by FDA under an Emergency Use Authorization (EUA). This EUA will remain  in effect (meaning this test can be used) for the duration of the COVID-19 declaration under Section 564(b)(1) of the Act, 21 U.S.C.section 360bbb-3(b)(1), unless the authorization is terminated  or revoked sooner.       Influenza A by PCR NEGATIVE NEGATIVE Final   Influenza B by PCR NEGATIVE NEGATIVE Final    Comment: (NOTE) The Xpert Xpress SARS-CoV-2/FLU/RSV plus assay is intended as an aid in the diagnosis of influenza from Nasopharyngeal swab specimens and should not be used as a sole basis for treatment. Nasal washings and aspirates are unacceptable for Xpert Xpress SARS-CoV-2/FLU/RSV testing.  Fact Sheet for Patients: EntrepreneurPulse.com.au  Fact Sheet for Healthcare  Providers: IncredibleEmployment.be  This test is not yet approved or cleared by the [place] FDA and has been authorized for detection and/or diagnosis of SARS-CoV-2 by FDA under an Emergency Use Authorization (EUA). This EUA will remain in effect (meaning this test can be used) for the duration of the COVID-19 declaration under Section 564(b)(1) of the Act, 21 U.S.C. section 360bbb-3(b)(1), unless the authorization is terminated or revoked.  Performed at [facility], [place] [address], [place], [place] [address]   Urine Culture     Status: None   Collection Time: 12/29/20  8:15 PM   Specimen: Urine, Clean Catch  Result Value Ref Range Status   Specimen Description URINE, CLEAN CATCH  Final   Special Requests NONE  Final   Culture   Final    NO GROWTH Performed at [facility], [place] [address] [place], [place] [address]    Report Status 12/31/2020 FINAL  Final  Blood culture (routine x 2)     Status: None (Preliminary result)   Collection Time: 12/30/20  2:42 AM   Specimen: BLOOD  Result Value Ref Range Status   Specimen Description BLOOD RIGHT ANTECUBITAL  Final   Special Requests   Final    BOTTLES DRAWN AEROBIC AND ANAEROBIC Blood Culture adequate volume   Culture   Final    NO GROWTH 1 DAY Performed at [facility], [place] [address] [place], [place] [address]    Report Status PENDING  Incomplete  Blood culture (routine x 2)     Status: None (Preliminary result)   Collection Time: 12/30/20  2:47 AM   Specimen: BLOOD  Result Value Ref Range Status   Specimen Description BLOOD  Final   Special Requests   Final    BOTTLES DRAWN AEROBIC AND ANAEROBIC Blood Culture adequate volume   Culture   Final    NO GROWTH 1 DAY Performed at [facility], [place] [address] [place], [place] [address]    Report Status PENDING  Incomplete     Labs: BNP (last 3 results) No results for input(s): BNP in the last 8760 hours. Basic Metabolic  Panel: Recent Labs  Lab 12/29/20 2038 12/30/20 0645 12/31/20 0321  NA 137 138 138  K 3.7 3.9 3.9  CL 103 108 107  CO2 24 23 24   GLUCOSE 143* 102* 98  BUN 12 8 8   CREATININE 0.63 0.50 0.61  CALCIUM 9.3 8.1* 8.5*  MG  --   --  2.3   Liver Function Tests: Recent Labs  Lab 12/29/20 2038  AST  23  ALT 20  ALKPHOS 74  BILITOT 0.6  PROT 6.6  ALBUMIN 3.8   No results for input(s): LIPASE, AMYLASE in the last 168 hours. No results for input(s): AMMONIA in the last 168 hours. CBC: Recent Labs  Lab 12/29/20 2038 12/30/20 0645 12/31/20 0321  WBC 22.3* 15.9* 8.2  NEUTROABS 18.9*  --   --   HGB 13.8 11.9* 11.3*  HCT 41.0 35.3* 34.6*  MCV 89.9 90.1 89.9  PLT 272 258 238   Cardiac Enzymes: No results for input(s): CKTOTAL, CKMB, CKMBINDEX, TROPONINI in the last 168 hours. BNP: Invalid input(s): POCBNP CBG: No results for input(s): GLUCAP in the last 168 hours. D-Dimer No results for input(s): DDIMER in the last 72 hours. Hgb A1c No results for input(s): HGBA1C in the last 72 hours. Lipid Profile No results for input(s): CHOL, HDL, LDLCALC, TRIG, CHOLHDL, LDLDIRECT in the last 72 hours. Thyroid function studies No results for input(s): TSH, T4TOTAL, T3FREE, THYROIDAB in the last 72 hours.  Invalid input(s): FREET3 Anemia work up No results for input(s): VITAMINB12, FOLATE, FERRITIN, TIBC, IRON, RETICCTPCT in the last 72 hours. Urinalysis    Component Value Date/Time   COLORURINE YELLOW 12/29/2020 2015   APPEARANCEUR HAZY (A) 12/29/2020 2015   LABSPEC 1.014 12/29/2020 2015   PHURINE 5.0 12/29/2020 2015   GLUCOSEU >=500 (A) 12/29/2020 2015   HGBUR NEGATIVE 12/29/2020 2015   BILIRUBINUR NEGATIVE 12/29/2020 2015   BILIRUBINUR n 05/22/2019 1153   KETONESUR 5 (A) 12/29/2020 2015   PROTEINUR NEGATIVE 12/29/2020 2015   UROBILINOGEN 0.2 05/22/2019 1153   UROBILINOGEN 0.2 07/08/2018 1810   NITRITE NEGATIVE 12/29/2020 2015   LEUKOCYTESUR SMALL (A) 12/29/2020 2015    Sepsis Labs Invalid input(s): PROCALCITONIN,  WBC,  LACTICIDVEN Microbiology Recent Results (from the past 240 hour(s))  Resp Panel by RT-PCR (Flu A&B, Covid) Urine, Clean Catch     Status: None   Collection Time: 12/29/20  8:15 PM   Specimen: Urine, Clean Catch; Nasopharyngeal(NP) swabs in vial transport medium  Result Value Ref Range Status   SARS Coronavirus 2 by RT PCR NEGATIVE NEGATIVE Final    Comment: (NOTE) SARS-CoV-2 target nucleic acids are NOT DETECTED.  The SARS-CoV-2 RNA is generally detectable in upper respiratory specimens during the acute phase of infection. The lowest concentration of SARS-CoV-2 viral copies this assay can detect is 138 copies/mL. A negative result does not preclude SARS-Cov-2 infection and should not be used as the sole basis for treatment or other patient management decisions. A negative result may occur with  improper specimen collection/handling, submission of specimen other than nasopharyngeal swab, presence of viral mutation(s) within the areas targeted by this assay, and inadequate number of viral copies(<138 copies/mL). A negative result must be combined with clinical observations, patient history, and epidemiological information. The expected result is Negative.  Fact Sheet for Patients:  EntrepreneurPulse.com.au  Fact Sheet for Healthcare Providers:  IncredibleEmployment.be  This test is no t yet approved or cleared by the [place] FDA and  has been authorized for detection and/or diagnosis of SARS-CoV-2 by FDA under an Emergency Use Authorization (EUA). This EUA will remain  in effect (meaning this test can be used) for the duration of the COVID-19 declaration under Section 564(b)(1) of the Act, 21 U.S.C.section 360bbb-3(b)(1), unless the authorization is terminated  or revoked sooner.       Influenza A by PCR NEGATIVE NEGATIVE Final   Influenza B by PCR NEGATIVE NEGATIVE Final     Comment: (NOTE) The  Xpert Xpress SARS-CoV-2/FLU/RSV plus assay is intended as an aid in the diagnosis of influenza from Nasopharyngeal swab specimens and should not be used as a sole basis for treatment. Nasal washings and aspirates are unacceptable for Xpert Xpress SARS-CoV-2/FLU/RSV testing.  Fact Sheet for Patients: EntrepreneurPulse.com.au  Fact Sheet for Healthcare Providers: IncredibleEmployment.be  This test is not yet approved or cleared by the [place] FDA and has been authorized for detection and/or diagnosis of SARS-CoV-2 by FDA under an Emergency Use Authorization (EUA). This EUA will remain in effect (meaning this test can be used) for the duration of the COVID-19 declaration under Section 564(b)(1) of the Act, 21 U.S.C. section 360bbb-3(b)(1), unless the authorization is terminated or revoked.  Performed at [facility], [place] [address], [place], [place] [address]   Urine Culture     Status: None   Collection Time: 12/29/20  8:15 PM   Specimen: Urine, Clean Catch  Result Value Ref Range Status   Specimen Description URINE, CLEAN CATCH  Final   Special Requests NONE  Final   Culture   Final    NO GROWTH Performed at [facility], [place] [address] [place], [place] [address]    Report Status 12/31/2020 FINAL  Final  Blood culture (routine x 2)     Status: None (Preliminary result)   Collection Time: 12/30/20  2:42 AM   Specimen: BLOOD  Result Value Ref Range Status   Specimen Description BLOOD RIGHT ANTECUBITAL  Final   Special Requests   Final    BOTTLES DRAWN AEROBIC AND ANAEROBIC Blood Culture adequate volume   Culture   Final    NO GROWTH 1 DAY Performed at [facility], [place] [address] [place], [place] [address]    Report Status PENDING  Incomplete  Blood culture (routine x 2)     Status: None (Preliminary result)   Collection Time: 12/30/20  2:47 AM   Specimen: BLOOD  Result Value Ref Range  Status   Specimen Description BLOOD  Final   Special Requests   Final    BOTTLES DRAWN AEROBIC AND ANAEROBIC Blood Culture adequate volume   Culture   Final    NO GROWTH 1 DAY Performed at [facility], [place] [address], [place], [place] [address]    Report Status PENDING  Incomplete     Time coordinating discharge:  I have spent 35 minutes face to face with the patient and on the ward discussing the patients care, assessment, plan and disposition with other care givers. >50% of the time was devoted counseling the patient about the risks and benefits of treatment/Discharge disposition and coordinating care.   SIGNED:   Damita Lack, MD  [facility] 12/31/2020, 11:38 AM   If 7PM-7AM, please contact night-coverage

## 2021-01-04 LAB — CULTURE, BLOOD (ROUTINE X 2)
Culture: NO GROWTH
Culture: NO GROWTH
Special Requests: ADEQUATE
Special Requests: ADEQUATE

## 2021-01-06 DIAGNOSIS — Z87440 Personal history of urinary (tract) infections: Secondary | ICD-10-CM | POA: Diagnosis not present

## 2021-01-06 DIAGNOSIS — U071 COVID-19: Secondary | ICD-10-CM | POA: Diagnosis not present

## 2021-01-06 DIAGNOSIS — R051 Acute cough: Secondary | ICD-10-CM | POA: Diagnosis not present

## 2021-02-12 DIAGNOSIS — R300 Dysuria: Secondary | ICD-10-CM | POA: Diagnosis not present

## 2021-02-15 DIAGNOSIS — E7800 Pure hypercholesterolemia, unspecified: Secondary | ICD-10-CM | POA: Diagnosis not present

## 2021-02-15 DIAGNOSIS — I10 Essential (primary) hypertension: Secondary | ICD-10-CM | POA: Diagnosis not present

## 2021-03-09 DIAGNOSIS — R1084 Generalized abdominal pain: Secondary | ICD-10-CM | POA: Diagnosis not present

## 2021-03-09 DIAGNOSIS — K068 Other specified disorders of gingiva and edentulous alveolar ridge: Secondary | ICD-10-CM | POA: Diagnosis not present

## 2021-03-12 DIAGNOSIS — E7800 Pure hypercholesterolemia, unspecified: Secondary | ICD-10-CM | POA: Diagnosis not present

## 2021-03-12 DIAGNOSIS — K219 Gastro-esophageal reflux disease without esophagitis: Secondary | ICD-10-CM | POA: Diagnosis not present

## 2021-03-12 DIAGNOSIS — R1084 Generalized abdominal pain: Secondary | ICD-10-CM | POA: Diagnosis not present

## 2021-03-12 DIAGNOSIS — I10 Essential (primary) hypertension: Secondary | ICD-10-CM | POA: Diagnosis not present

## 2021-03-12 DIAGNOSIS — R7303 Prediabetes: Secondary | ICD-10-CM | POA: Diagnosis not present

## 2021-03-12 DIAGNOSIS — F5101 Primary insomnia: Secondary | ICD-10-CM | POA: Diagnosis not present

## 2021-03-25 DIAGNOSIS — Z0000 Encounter for general adult medical examination without abnormal findings: Secondary | ICD-10-CM | POA: Diagnosis not present

## 2021-03-25 DIAGNOSIS — F5101 Primary insomnia: Secondary | ICD-10-CM | POA: Diagnosis not present

## 2021-03-25 DIAGNOSIS — E7800 Pure hypercholesterolemia, unspecified: Secondary | ICD-10-CM | POA: Diagnosis not present

## 2021-03-25 DIAGNOSIS — R7303 Prediabetes: Secondary | ICD-10-CM | POA: Diagnosis not present

## 2021-03-25 DIAGNOSIS — K219 Gastro-esophageal reflux disease without esophagitis: Secondary | ICD-10-CM | POA: Diagnosis not present

## 2021-03-25 DIAGNOSIS — I10 Essential (primary) hypertension: Secondary | ICD-10-CM | POA: Diagnosis not present

## 2021-04-07 ENCOUNTER — Encounter (HOSPITAL_BASED_OUTPATIENT_CLINIC_OR_DEPARTMENT_OTHER): Payer: Self-pay | Admitting: Obstetrics & Gynecology

## 2021-05-20 DIAGNOSIS — H0102B Squamous blepharitis left eye, upper and lower eyelids: Secondary | ICD-10-CM | POA: Diagnosis not present

## 2021-05-20 DIAGNOSIS — H04123 Dry eye syndrome of bilateral lacrimal glands: Secondary | ICD-10-CM | POA: Diagnosis not present

## 2021-05-20 DIAGNOSIS — H4322 Crystalline deposits in vitreous body, left eye: Secondary | ICD-10-CM | POA: Diagnosis not present

## 2021-05-20 DIAGNOSIS — H02834 Dermatochalasis of left upper eyelid: Secondary | ICD-10-CM | POA: Diagnosis not present

## 2021-05-20 DIAGNOSIS — D3132 Benign neoplasm of left choroid: Secondary | ICD-10-CM | POA: Diagnosis not present

## 2021-05-20 DIAGNOSIS — H40013 Open angle with borderline findings, low risk, bilateral: Secondary | ICD-10-CM | POA: Diagnosis not present

## 2021-05-20 DIAGNOSIS — H02831 Dermatochalasis of right upper eyelid: Secondary | ICD-10-CM | POA: Diagnosis not present

## 2021-05-20 DIAGNOSIS — H0102A Squamous blepharitis right eye, upper and lower eyelids: Secondary | ICD-10-CM | POA: Diagnosis not present

## 2021-05-20 DIAGNOSIS — Z961 Presence of intraocular lens: Secondary | ICD-10-CM | POA: Diagnosis not present

## 2021-06-21 DIAGNOSIS — J301 Allergic rhinitis due to pollen: Secondary | ICD-10-CM | POA: Diagnosis not present

## 2021-06-21 DIAGNOSIS — M545 Low back pain, unspecified: Secondary | ICD-10-CM | POA: Diagnosis not present

## 2021-06-23 ENCOUNTER — Ambulatory Visit: Payer: Medicare Other | Admitting: Obstetrics and Gynecology

## 2021-06-23 ENCOUNTER — Encounter: Payer: Self-pay | Admitting: Obstetrics and Gynecology

## 2021-06-23 VITALS — BP 182/75 | HR 61

## 2021-06-23 DIAGNOSIS — R350 Frequency of micturition: Secondary | ICD-10-CM | POA: Diagnosis not present

## 2021-06-23 DIAGNOSIS — M62838 Other muscle spasm: Secondary | ICD-10-CM | POA: Diagnosis not present

## 2021-06-23 DIAGNOSIS — N3941 Urge incontinence: Secondary | ICD-10-CM

## 2021-06-23 DIAGNOSIS — N8110 Cystocele, unspecified: Secondary | ICD-10-CM

## 2021-06-23 LAB — POCT URINALYSIS DIPSTICK
Appearance: NORMAL
Bilirubin, UA: NEGATIVE
Blood, UA: NEGATIVE
Glucose, UA: NEGATIVE
Ketones, UA: NEGATIVE
Nitrite, UA: NEGATIVE
Protein, UA: NEGATIVE
Spec Grav, UA: 1.015 (ref 1.010–1.025)
Urobilinogen, UA: 0.2 E.U./dL
pH, UA: 7 (ref 5.0–8.0)
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
## 2021-06-24 ENCOUNTER — Encounter: Payer: Self-pay | Admitting: Obstetrics and Gynecology

## 2021-06-30 DIAGNOSIS — M545 Low back pain, unspecified: Secondary | ICD-10-CM | POA: Diagnosis not present

## 2021-07-21 DIAGNOSIS — R600 Localized edema: Secondary | ICD-10-CM | POA: Diagnosis not present

## 2021-07-21 DIAGNOSIS — M62838 Other muscle spasm: Secondary | ICD-10-CM | POA: Diagnosis not present

## 2021-07-21 DIAGNOSIS — R7303 Prediabetes: Secondary | ICD-10-CM | POA: Diagnosis not present

## 2021-07-21 DIAGNOSIS — I10 Essential (primary) hypertension: Secondary | ICD-10-CM | POA: Diagnosis not present

## 2021-07-21 DIAGNOSIS — N8110 Cystocele, unspecified: Secondary | ICD-10-CM | POA: Diagnosis not present

## 2021-07-30 ENCOUNTER — Ambulatory Visit: Payer: Medicare Other | Admitting: Obstetrics and Gynecology

## 2021-08-02 ENCOUNTER — Other Ambulatory Visit: Payer: Self-pay | Admitting: Family Medicine

## 2021-08-02 DIAGNOSIS — Z1231 Encounter for screening mammogram for malignant neoplasm of breast: Secondary | ICD-10-CM

## 2021-08-06 ENCOUNTER — Telehealth: Payer: Self-pay

## 2021-08-06 NOTE — Telephone Encounter (Signed)
Patient called clinical phone and states she needs to change appointments. Not seen at our office. Pt of Uro/Gyn. Call forwarded.

## 2021-08-09 ENCOUNTER — Ambulatory Visit: Payer: Medicare Other | Admitting: Obstetrics and Gynecology

## 2021-08-09 DIAGNOSIS — R350 Frequency of micturition: Secondary | ICD-10-CM | POA: Diagnosis not present

## 2021-08-09 DIAGNOSIS — N8111 Cystocele, midline: Secondary | ICD-10-CM | POA: Diagnosis not present

## 2021-08-20 DIAGNOSIS — C50911 Malignant neoplasm of unspecified site of right female breast: Secondary | ICD-10-CM | POA: Diagnosis not present

## 2021-09-01 DIAGNOSIS — C50911 Malignant neoplasm of unspecified site of right female breast: Secondary | ICD-10-CM | POA: Diagnosis not present

## 2021-09-16 ENCOUNTER — Ambulatory Visit
Admission: RE | Admit: 2021-09-16 | Discharge: 2021-09-16 | Disposition: A | Discharge status: Home / Self Care | Payer: Medicare Other | Source: Ambulatory Visit | Attending: Family Medicine | Admitting: Family Medicine

## 2021-09-16 DIAGNOSIS — Z1231 Encounter for screening mammogram for malignant neoplasm of breast: Secondary | ICD-10-CM | POA: Diagnosis not present

## 2021-10-27 DIAGNOSIS — K582 Mixed irritable bowel syndrome: Secondary | ICD-10-CM | POA: Diagnosis not present

## 2021-10-27 DIAGNOSIS — G8929 Other chronic pain: Secondary | ICD-10-CM | POA: Diagnosis not present

## 2021-10-27 DIAGNOSIS — E7800 Pure hypercholesterolemia, unspecified: Secondary | ICD-10-CM | POA: Diagnosis not present

## 2021-10-27 DIAGNOSIS — R7303 Prediabetes: Secondary | ICD-10-CM | POA: Diagnosis not present

## 2021-10-27 DIAGNOSIS — J309 Allergic rhinitis, unspecified: Secondary | ICD-10-CM | POA: Diagnosis not present

## 2021-10-27 DIAGNOSIS — I10 Essential (primary) hypertension: Secondary | ICD-10-CM | POA: Diagnosis not present

## 2021-11-11 DIAGNOSIS — R3915 Urgency of urination: Secondary | ICD-10-CM | POA: Diagnosis not present

## 2021-11-11 DIAGNOSIS — N8111 Cystocele, midline: Secondary | ICD-10-CM | POA: Diagnosis not present

## 2021-12-27 DIAGNOSIS — I10 Essential (primary) hypertension: Secondary | ICD-10-CM | POA: Diagnosis not present

## 2022-01-11 ENCOUNTER — Other Ambulatory Visit (HOSPITAL_COMMUNITY): Payer: Self-pay

## 2022-01-31 DIAGNOSIS — I10 Essential (primary) hypertension: Secondary | ICD-10-CM | POA: Diagnosis not present

## 2022-01-31 DIAGNOSIS — E7800 Pure hypercholesterolemia, unspecified: Secondary | ICD-10-CM | POA: Diagnosis not present

## 2022-01-31 DIAGNOSIS — R2689 Other abnormalities of gait and mobility: Secondary | ICD-10-CM | POA: Diagnosis not present

## 2022-01-31 DIAGNOSIS — R7303 Prediabetes: Secondary | ICD-10-CM | POA: Diagnosis not present

## 2022-03-09 DIAGNOSIS — H8309 Labyrinthitis, unspecified ear: Secondary | ICD-10-CM | POA: Diagnosis not present

## 2022-03-09 DIAGNOSIS — J018 Other acute sinusitis: Secondary | ICD-10-CM | POA: Diagnosis not present

## 2022-03-09 DIAGNOSIS — J3489 Other specified disorders of nose and nasal sinuses: Secondary | ICD-10-CM | POA: Diagnosis not present

## 2022-03-09 DIAGNOSIS — S161XXA Strain of muscle, fascia and tendon at neck level, initial encounter: Secondary | ICD-10-CM | POA: Diagnosis not present

## 2022-03-16 DIAGNOSIS — C50911 Malignant neoplasm of unspecified site of right female breast: Secondary | ICD-10-CM | POA: Diagnosis not present

## 2022-04-14 DIAGNOSIS — J309 Allergic rhinitis, unspecified: Secondary | ICD-10-CM | POA: Diagnosis not present

## 2022-04-14 DIAGNOSIS — R7303 Prediabetes: Secondary | ICD-10-CM | POA: Diagnosis not present

## 2022-04-14 DIAGNOSIS — Z0000 Encounter for general adult medical examination without abnormal findings: Secondary | ICD-10-CM | POA: Diagnosis not present

## 2022-04-14 DIAGNOSIS — R110 Nausea: Secondary | ICD-10-CM | POA: Diagnosis not present

## 2022-04-14 DIAGNOSIS — G8929 Other chronic pain: Secondary | ICD-10-CM | POA: Diagnosis not present

## 2022-04-14 DIAGNOSIS — R7309 Other abnormal glucose: Secondary | ICD-10-CM | POA: Diagnosis not present

## 2022-04-14 DIAGNOSIS — E7800 Pure hypercholesterolemia, unspecified: Secondary | ICD-10-CM | POA: Diagnosis not present

## 2022-04-14 DIAGNOSIS — I10 Essential (primary) hypertension: Secondary | ICD-10-CM | POA: Diagnosis not present

## 2022-04-14 DIAGNOSIS — K582 Mixed irritable bowel syndrome: Secondary | ICD-10-CM | POA: Diagnosis not present

## 2022-04-26 NOTE — Progress Notes (Signed)
[facility] Return Visit  SUBJECTIVE  History of Present Illness: Alexandra Gonzales is a 82 y.o. female seen in follow-up for POP. Plan at last visit was return for pessary fitting and consider SUI options after pessary. Patient has not yet had a pessary fitting.    Patient is very anxious regarding her prolapse. She is anxious about the need for potential surgery and anxious about having a pessary.   She reports she has been on estrogen cream that was prescribed by her urologist.   Patient is concerned that she has pain in her hip and wondered if this is related to her bladder. She also has had some difficulty emptying her bladder and her bowels.    Past Medical History: Patient  has a past medical history of Allergy, Anxiety, Bilateral carotid artery stenosis, Breast cancer, Bronchitis, Cancer, Chronic pain, Colon spasm, Diverticulitis, Dysphagia, Functional gait abnormality, GERD (gastroesophageal reflux disease), Hyperlipidemia, Hyperlipidemia, Hypertension, IBS (irritable bowel syndrome), Insomnia, Kidney stones, Low back pain, Lumbar radiculopathy, Myalgia, Neuropathy, Osteopenia, Personal history of radiation therapy, Sciatica, and Sleep disorder.   Past Surgical History: She  has a past surgical history that includes Abdominal hysterectomy; Exploratory laparotomy; Breast surgery; Eye surgery; Mouth surgery; Breast lumpectomy (Left); Radioactive seed guided excisional breast biopsy (Left, 09/26/2016); Breast excisional biopsy; Exploratory laparotomy; and Cataract extraction, bilateral.   Medications: She has a current medication list which includes the following prescription(s): acetaminophen, amlodipine, dicyclomine, estradiol, famotidine, furosemide, loratadine, losartan, melatonin, montelukast, pantoprazole, polyvinyl alcohol-povidone, prednisone, and probiotic-10.   Allergies: Patient is allergic to morphine and related, codeine, dexamethasone, erythromycin, hydrocodone,  lyrica [pregabalin], prevacid [lansoprazole], clarithromycin, famotidine, latex, nitrofuran derivatives, polytrim [polymyxin b-trimethoprim], prednisone, ranitidine, sulfa antibiotics, zantac [ranitidine hcl], and penicillins.   Social History: Patient  reports that she has never smoked. She has never used smokeless tobacco. She reports that she does not drink alcohol and does not use drugs.      OBJECTIVE     Physical Exam: Vitals:   04/28/22 1008 04/28/22 1012  BP: (!) 150/74 (!) 148/62  Pulse: 65 60   Gen: No apparent distress, A&O x 3.  Detailed Urogynecologic Evaluation:  **See pessary fitting note    ASSESSMENT AND PLAN    Alexandra Gonzales is a 82 y.o. with:  1. Prolapse of anterior vaginal wall   2. Urge incontinence   3. Rectal pain    Patient had not yet had a pessary fitting. Patient fitted today with #2 Gellhorn pessary. She will not manage this herself.  Will see how the pessary assists patient in her incontinence issues and re-evaluate at follow up.  Patient has a external hemorrhoid which may be pertaining to her rectal pain. We discussed that with the pessary in place this could assist in her better emptying of her bowels but we would have to see how she felt at follow up.    Patient to follow up in 1 month for follow up of pessary.

## 2022-04-28 ENCOUNTER — Ambulatory Visit (INDEPENDENT_AMBULATORY_CARE_PROVIDER_SITE_OTHER): Payer: HMO | Admitting: Obstetrics and Gynecology

## 2022-04-28 ENCOUNTER — Encounter: Payer: Self-pay | Admitting: Obstetrics and Gynecology

## 2022-04-28 VITALS — BP 148/62 | HR 60

## 2022-04-28 DIAGNOSIS — N3941 Urge incontinence: Secondary | ICD-10-CM | POA: Diagnosis not present

## 2022-04-28 DIAGNOSIS — K6289 Other specified diseases of anus and rectum: Secondary | ICD-10-CM

## 2022-04-28 DIAGNOSIS — Z4689 Encounter for fitting and adjustment of other specified devices: Secondary | ICD-10-CM

## 2022-04-28 DIAGNOSIS — N8110 Cystocele, unspecified: Secondary | ICD-10-CM | POA: Diagnosis not present

## 2022-04-28 NOTE — Progress Notes (Signed)
Alexandra Gonzales   Subjective:     Chief Complaint: Vaginal Prolapse Alexandra Gonzales is a 82 y.o. female is here for prolapse)  History of Present Illness: Alexandra Gonzales is a 82 y.o. female with stage II pelvic organ prolapse who presents today for a pessary fitting.    Past Medical History: Patient  has a past medical history of Allergy, Anxiety, Bilateral carotid artery stenosis, Breast cancer, Bronchitis, Cancer, Chronic pain, Colon spasm, Diverticulitis, Dysphagia, Functional gait abnormality, GERD (gastroesophageal reflux disease), Hyperlipidemia, Hyperlipidemia, Hypertension, IBS (irritable bowel syndrome), Insomnia, Kidney stones, Low back pain, Lumbar radiculopathy, Myalgia, Neuropathy, Osteopenia, Personal history of radiation therapy, Sciatica, and Sleep disorder.   Past Surgical History: She  has a past surgical history that includes Abdominal hysterectomy; Exploratory laparotomy; Breast surgery; Eye surgery; Mouth surgery; Breast lumpectomy (Left); Radioactive seed guided excisional breast biopsy (Left, 09/26/2016); Breast excisional biopsy; Exploratory laparotomy; and Cataract extraction, bilateral.   Medications: She has a current medication list which includes the following prescription(s): acetaminophen, amlodipine, dicyclomine, estradiol, famotidine, furosemide, loratadine, losartan, melatonin, montelukast, pantoprazole, polyvinyl alcohol-povidone, prednisone, and probiotic-10.   Allergies: Patient is allergic to morphine and related, codeine, dexamethasone, erythromycin, hydrocodone, lyrica [pregabalin], prevacid [lansoprazole], clarithromycin, famotidine, latex, nitrofuran derivatives, polytrim [polymyxin b-trimethoprim], prednisone, ranitidine, sulfa antibiotics, zantac [ranitidine hcl], and penicillins.   Social History: Patient  reports that she has never smoked. She has never used smokeless tobacco. She reports that she does not drink alcohol and does not use  drugs.      Objective:    BP (!) 148/62   Pulse 60   LMP  (LMP Unknown)  Gen: No apparent distress, A&O x 3. Pelvic Exam: Normal external female genitalia; Bartholin's and Skene's glands normal in appearance; urethral meatus normal in appearance, no urethral masses or discharge.   A size #2 short stem gellhorn pessary (Lot [id number]) was fitted. It was comfortable, stayed in place with valsalva and was an appropriate size on examination, with one finger fitting between the pessary and the vaginal walls. Patient was able to urinate without pessary expulsion.   Assessment/Plan:    Assessment: Ms. Trichel is a 82 y.o. with stage II pelvic organ prolapse and OAB who presents for a pessary fitting. Plan: She was fitted with a #2 short stem gellhorn pessary. She will keep the pessary in place until next visit. She will use estrogen.   Follow-up in 4 weeks for a pessary check or sooner as needed.  All questions were answered.    Selmer Dominion, NP

## 2022-05-19 ENCOUNTER — Ambulatory Visit (INDEPENDENT_AMBULATORY_CARE_PROVIDER_SITE_OTHER): Payer: HMO | Admitting: Podiatry

## 2022-05-19 ENCOUNTER — Encounter: Payer: Self-pay | Admitting: Podiatry

## 2022-05-19 ENCOUNTER — Ambulatory Visit (INDEPENDENT_AMBULATORY_CARE_PROVIDER_SITE_OTHER): Payer: HMO

## 2022-05-19 DIAGNOSIS — M7752 Other enthesopathy of left foot: Secondary | ICD-10-CM

## 2022-05-19 DIAGNOSIS — M7751 Other enthesopathy of right foot: Secondary | ICD-10-CM | POA: Diagnosis not present

## 2022-05-19 DIAGNOSIS — M79671 Pain in right foot: Secondary | ICD-10-CM

## 2022-05-19 DIAGNOSIS — M21619 Bunion of unspecified foot: Secondary | ICD-10-CM

## 2022-05-19 DIAGNOSIS — M79672 Pain in left foot: Secondary | ICD-10-CM | POA: Diagnosis not present

## 2022-05-19 MED ORDER — TRIAMCINOLONE ACETONIDE 10 MG/ML IJ SUSP
20.0000 mg | Freq: Once | INTRAMUSCULAR | Status: AC
Start: 2022-05-19 — End: 2022-05-19
  Administered 2022-05-19: 20 mg

## 2022-05-19 NOTE — Progress Notes (Signed)
Subjective:   Patient ID: Alexandra Gonzales, female   DOB: 82 y.o.   MRN: [id number]   HPI Patient presents with caregiver with pain in both ankles and structural deformity left big toe joint.  States that she has a lot of pain hard to describe what it is but it seems to be more in the ankle area and she feels like she has trouble walking consistently.  Patient does not smoke tries to be active   Review of Systems  All other systems reviewed and are negative.       Objective:  Physical Exam Vitals and nursing note reviewed.  Constitutional:      Appearance: She is well-developed.  Pulmonary:     Effort: Pulmonary effort is normal.  Musculoskeletal:        General: Normal range of motion.  Skin:    General: Skin is warm.  Neurological:     Mental Status: She is alert.     Neurovascular status found to be mildly reduced with reduced range of motion around the subtalar midtarsal joint with patient found to have inflammation which seems to be worse within the sinus tarsi bilateral with inflammation around the area and seems to have moderate collapse of the arch.  There is redness and pain around the first metatarsal head left and she also has neurological issues as far as burning type pain that has failed Lyrica and gabapentin     Assessment:  Appears to be some form of inflammatory condition with capsulitis of the sinus tarsi subtalar joint bilateral with structural bunion deformity left and neuropathy unknown nature     Plan:  H&P reviewed all conditions went ahead today did sterile prep injected the sinus tarsi bilateral 3 mg Kenalog 5 mg Xylocaine and discussed bunion but do not recommend surgery for this currently but ultimately could be done but I think wider shoes mesh materials would be best  X-rays indicate moderate collapse medial longitudinal arch bilateral with structural bunion deformity left

## 2022-05-20 ENCOUNTER — Other Ambulatory Visit: Payer: Self-pay | Admitting: Podiatry

## 2022-05-20 DIAGNOSIS — M7751 Other enthesopathy of right foot: Secondary | ICD-10-CM

## 2022-05-20 DIAGNOSIS — M21619 Bunion of unspecified foot: Secondary | ICD-10-CM

## 2022-05-20 DIAGNOSIS — M7752 Other enthesopathy of left foot: Secondary | ICD-10-CM

## 2022-05-20 DIAGNOSIS — M79671 Pain in right foot: Secondary | ICD-10-CM

## 2022-06-03 ENCOUNTER — Encounter: Payer: Self-pay | Admitting: Obstetrics and Gynecology

## 2022-06-03 ENCOUNTER — Ambulatory Visit (INDEPENDENT_AMBULATORY_CARE_PROVIDER_SITE_OTHER): Payer: HMO | Admitting: Obstetrics and Gynecology

## 2022-06-03 VITALS — BP 143/54 | HR 62

## 2022-06-03 DIAGNOSIS — Z4689 Encounter for fitting and adjustment of other specified devices: Secondary | ICD-10-CM

## 2022-06-03 DIAGNOSIS — N3941 Urge incontinence: Secondary | ICD-10-CM | POA: Diagnosis not present

## 2022-06-03 DIAGNOSIS — N8110 Cystocele, unspecified: Secondary | ICD-10-CM

## 2022-06-03 DIAGNOSIS — K6289 Other specified diseases of anus and rectum: Secondary | ICD-10-CM | POA: Diagnosis not present

## 2022-06-03 NOTE — Patient Instructions (Signed)
Keep using the estrogen cream x3 weekly.

## 2022-06-03 NOTE — Progress Notes (Signed)
 Urogynecology   Subjective:     Chief Complaint:  Chief Complaint  Patient presents with   Pessary Check   History of Present Illness: Alexandra Gonzales is a 82 y.o. female with stage II pelvic organ prolapse who presents for a pessary check. She is using a size #2 short stem gellhorn pessary. The pessary has been working well and she has no complaints. She is using vaginal estrogen. She denies vaginal bleeding. Also endorses she has been having easier bowel movements and reduced leakage.   Past Medical History: Patient  has a past medical history of Allergy, Anxiety, Bilateral carotid artery stenosis, Breast cancer (HCC), Bronchitis, Cancer (HCC), Chronic pain, Colon spasm, Diverticulitis, Dysphagia, Functional gait abnormality, GERD (gastroesophageal reflux disease), Hyperlipidemia, Hyperlipidemia, Hypertension, IBS (irritable bowel syndrome), Insomnia, Kidney stones, Low back pain, Lumbar radiculopathy, Myalgia, Neuropathy, Osteopenia, Personal history of radiation therapy, Sciatica, and Sleep disorder.   Past Surgical History: She  has a past surgical history that includes Abdominal hysterectomy; Exploratory laparotomy; Breast surgery; Eye surgery; Mouth surgery; Breast lumpectomy (Left); Radioactive seed guided excisional breast biopsy (Left, 09/26/2016); Breast excisional biopsy; Exploratory laparotomy; and Cataract extraction, bilateral.   Medications: She has a current medication list which includes the following prescription(s): acetaminophen, amlodipine, dicyclomine, estradiol, famotidine, furosemide, loratadine, losartan, melatonin, montelukast, pantoprazole, polyvinyl alcohol-povidone, probiotic-10, and prednisone.   Allergies: Patient is allergic to morphine and codeine, codeine, dexamethasone, erythromycin, hydrocodone, lyrica [pregabalin], prevacid [lansoprazole], clarithromycin, famotidine, latex, nitrofuran derivatives, polytrim [polymyxin b-trimethoprim], prednisone,  ranitidine, sulfa antibiotics, zantac [ranitidine hcl], and penicillins.   Social History: Patient  reports that she has never smoked. She has never used smokeless tobacco. She reports that she does not drink alcohol and does not use drugs.      Objective:    Physical Exam: BP (!) 143/54   Pulse 62   LMP  (LMP Unknown)  Gen: No apparent distress, A&O x 3. Detailed Urogynecologic Evaluation:  Pelvic Exam: Normal external female genitalia; Bartholin's and Skene's glands normal in appearance; urethral meatus normal in appearance, no urethral masses or discharge. The pessary was noted to be in place. It was removed and cleaned. Speculum exam revealed no lesions in the vagina. The pessary was replaced. It was comfortable to the patient and fit well.     Assessment/Plan:    Assessment: Alexandra Gonzales is a 82 y.o. with stage II pelvic organ prolapse here for a pessary check. She is doing well.  Plan: She will keep the pessary in place until next visit. She will continue to use estrogen. She will follow-up in 3 months for a pessary check or sooner as needed.  All questions were answered.

## 2022-06-22 DIAGNOSIS — D692 Other nonthrombocytopenic purpura: Secondary | ICD-10-CM | POA: Diagnosis not present

## 2022-06-22 DIAGNOSIS — S30860A Insect bite (nonvenomous) of lower back and pelvis, initial encounter: Secondary | ICD-10-CM | POA: Diagnosis not present

## 2022-06-22 DIAGNOSIS — L821 Other seborrheic keratosis: Secondary | ICD-10-CM | POA: Diagnosis not present

## 2022-06-22 DIAGNOSIS — K130 Diseases of lips: Secondary | ICD-10-CM | POA: Diagnosis not present

## 2022-07-28 ENCOUNTER — Ambulatory Visit
Admission: RE | Admit: 2022-07-28 | Discharge: 2022-07-28 | Disposition: A | Discharge status: Home / Self Care | Payer: HMO | Source: Ambulatory Visit | Attending: Family Medicine | Admitting: Family Medicine

## 2022-07-28 ENCOUNTER — Other Ambulatory Visit: Payer: Self-pay | Admitting: Family Medicine

## 2022-07-28 DIAGNOSIS — E7800 Pure hypercholesterolemia, unspecified: Secondary | ICD-10-CM | POA: Diagnosis not present

## 2022-07-28 DIAGNOSIS — R1084 Generalized abdominal pain: Secondary | ICD-10-CM

## 2022-07-28 DIAGNOSIS — R7303 Prediabetes: Secondary | ICD-10-CM | POA: Diagnosis not present

## 2022-07-28 DIAGNOSIS — G8929 Other chronic pain: Secondary | ICD-10-CM | POA: Diagnosis not present

## 2022-07-28 DIAGNOSIS — K582 Mixed irritable bowel syndrome: Secondary | ICD-10-CM | POA: Diagnosis not present

## 2022-07-28 DIAGNOSIS — J309 Allergic rhinitis, unspecified: Secondary | ICD-10-CM | POA: Diagnosis not present

## 2022-07-28 DIAGNOSIS — R109 Unspecified abdominal pain: Secondary | ICD-10-CM | POA: Diagnosis not present

## 2022-07-28 DIAGNOSIS — I10 Essential (primary) hypertension: Secondary | ICD-10-CM | POA: Diagnosis not present

## 2022-08-05 ENCOUNTER — Telehealth: Payer: Self-pay

## 2022-08-05 NOTE — Telephone Encounter (Signed)
Alexandra Gonzales is a 82 y.o. female called in saying her pessary is preventing her from urinating. I asked the pt if she could call her GYN Dr so she can get the pessary removed. Pt said she was put her back on flomax and its helping. Pt said she will wait to hear from [facility] on Monday.

## 2022-08-08 ENCOUNTER — Telehealth: Payer: Self-pay | Admitting: Obstetrics and Gynecology

## 2022-08-08 DIAGNOSIS — I10 Essential (primary) hypertension: Secondary | ICD-10-CM | POA: Diagnosis not present

## 2022-08-08 DIAGNOSIS — R339 Retention of urine, unspecified: Secondary | ICD-10-CM | POA: Diagnosis not present

## 2022-08-08 DIAGNOSIS — Z79899 Other long term (current) drug therapy: Secondary | ICD-10-CM | POA: Diagnosis not present

## 2022-08-08 NOTE — Telephone Encounter (Signed)
Called patient regarding her pessary concerns. She reports she is having trouble emptying her bowels and is having leg swelling. She denies shortness of breath or chest pain. She endorses having significant amounts of color changes to her bilateral legs and itching. Concern for blood clot vs. CHF. Recent chest radiographs did not indicate increased heart size or small bowel obstruction. Still in the setting of her concerns recommended she go to the ER or call her PCP office. She reports she feels weak and shakey and started taking her lasix again over the weekend but is not on any kind of K+ replacement. Concern that she could have electrolyte imbalance as well. We discussed that none of this is related to her pessary use and she should call her PCP or go to the ER. She reports she will call PCP.

## 2022-08-08 NOTE — Telephone Encounter (Signed)
Called patient to address concerns.

## 2022-08-12 ENCOUNTER — Other Ambulatory Visit: Payer: Self-pay | Admitting: Family Medicine

## 2022-08-12 DIAGNOSIS — Z1231 Encounter for screening mammogram for malignant neoplasm of breast: Secondary | ICD-10-CM

## 2022-09-05 ENCOUNTER — Ambulatory Visit: Payer: HMO | Admitting: Obstetrics and Gynecology

## 2022-09-15 ENCOUNTER — Ambulatory Visit (INDEPENDENT_AMBULATORY_CARE_PROVIDER_SITE_OTHER): Payer: HMO | Admitting: Obstetrics and Gynecology

## 2022-09-15 ENCOUNTER — Encounter: Payer: Self-pay | Admitting: Obstetrics and Gynecology

## 2022-09-15 VITALS — BP 145/80 | HR 62

## 2022-09-15 DIAGNOSIS — N3941 Urge incontinence: Secondary | ICD-10-CM

## 2022-09-15 DIAGNOSIS — N8110 Cystocele, unspecified: Secondary | ICD-10-CM

## 2022-09-15 DIAGNOSIS — R82998 Other abnormal findings in urine: Secondary | ICD-10-CM | POA: Diagnosis not present

## 2022-09-15 DIAGNOSIS — R350 Frequency of micturition: Secondary | ICD-10-CM | POA: Diagnosis not present

## 2022-09-15 DIAGNOSIS — M62838 Other muscle spasm: Secondary | ICD-10-CM

## 2022-09-15 DIAGNOSIS — K6289 Other specified diseases of anus and rectum: Secondary | ICD-10-CM

## 2022-09-15 DIAGNOSIS — K5904 Chronic idiopathic constipation: Secondary | ICD-10-CM | POA: Diagnosis not present

## 2022-09-15 LAB — POCT URINALYSIS DIPSTICK
Bilirubin, UA: NEGATIVE
Blood, UA: NEGATIVE
Glucose, UA: NEGATIVE
Ketones, UA: NEGATIVE
Leukocytes, UA: NEGATIVE
Nitrite, UA: NEGATIVE
Protein, UA: NEGATIVE
Spec Grav, UA: 1.025 (ref 1.010–1.025)
Urobilinogen, UA: 0.2 U/dL
pH, UA: 7 (ref 5.0–8.0)

## 2022-09-15 MED ORDER — HYDROCORTISONE ACE-PRAMOXINE 1-1 % EX CREA: 1.0000 | TOPICAL_CREAM | Freq: Two times a day (BID) | CUTANEOUS | 0 refills | Status: DC

## 2022-09-15 MED ORDER — PHENAZOPYRIDINE HCL 95 MG PO TABS
95.0000 mg | ORAL_TABLET | Freq: Three times a day (TID) | ORAL | 1 refills | Status: DC | PRN
Start: 2022-09-15 — End: 2022-12-26

## 2022-09-15 NOTE — Patient Instructions (Addendum)
Constipation: Our goal is to achieve formed bowel movements daily or every-other-day.  You may need to try different combinations of the following options to find what works best for you - everybody's body works differently so feel free to adjust the dosages as needed.  Some options to help maintain bowel health include:  Dietary changes (more leafy greens, vegetables and fruits; less processed foods) Fiber supplementation (Benefiber, FiberCon, Metamucil or Psyllium). Start slow and increase gradually to full dose. Over-the-counter agents such as: stool softeners (Docusate or Colace) and/or laxatives (Miralax, milk of magnesia)  "Power Pudding" is a natural mixture that may help your constipation.  To make blend 1 cup applesauce, 1 cup wheat bran, and 3/4 cup prune juice, refrigerate and then take 1 tablespoon daily with a large glass of water as needed.       Use the estrogen cream twice a week.   Try the Pyridium for bladder irritation.

## 2022-09-15 NOTE — Progress Notes (Signed)
[facility]   Subjective:     Chief Complaint:  Chief Complaint  Patient presents with   Pessary Check    Alexandra Gonzales is a 82 y.o. female is here for 3 month pessary check.   History of Present Illness: Alexandra Gonzales is a 82 y.o. female with stage II pelvic organ prolapse who presents for a pessary check. She is using a size #2 short stem gellhorn pessary. The pessary was expelled with a bowel movement and she reports she feels like her rectum hurts sometimes due to straining. She is using vaginal estrogen. She denies vaginal bleeding.  Past Medical History: Patient  has a past medical history of Allergy, Anxiety, Bilateral carotid artery stenosis, Breast cancer (HCC), Bronchitis, Cancer (HCC), Chronic pain, Colon spasm, Diverticulitis, Dysphagia, Functional gait abnormality, GERD (gastroesophageal reflux disease), Hyperlipidemia, Hyperlipidemia, Hypertension, IBS (irritable bowel syndrome), Insomnia, Kidney stones, Low back pain, Lumbar radiculopathy, Myalgia, Neuropathy, Osteopenia, Personal history of radiation therapy, Sciatica, and Sleep disorder.   Past Surgical History: She  has a past surgical history that includes Abdominal hysterectomy; Exploratory laparotomy; Breast surgery; Eye surgery; Mouth surgery; Breast lumpectomy (Left); Radioactive seed guided excisional breast biopsy (Left, 09/26/2016); Breast excisional biopsy; Exploratory laparotomy; and Cataract extraction, bilateral.   Medications: She has a current medication list which includes the following prescription(s): acetaminophen, amlodipine, dicyclomine, estradiol, famotidine, loratadine, losartan, melatonin, montelukast, pantoprazole, phenazopyridine, polyvinyl alcohol-povidone, pramoxine-hydrocortisone, and probiotic-10.   Allergies: Patient is allergic to morphine and codeine, codeine, dexamethasone, erythromycin, hydrocodone, lyrica [pregabalin], prevacid [lansoprazole], clarithromycin, famotidine, latex,  nitrofuran derivatives, polytrim [polymyxin b-trimethoprim], prednisone, ranitidine, sulfa antibiotics, zantac [ranitidine hcl], and penicillins.   Social History: Patient  reports that she has never smoked. She has never used smokeless tobacco. She reports that she does not drink alcohol and does not use drugs.      Objective:    Physical Exam: BP (!) 145/80   Pulse 62   LMP  (LMP Unknown)  Gen: No apparent distress, A&O x 3. Detailed Urogynecologic Evaluation:  Pelvic Exam: Normal external female genitalia; Bartholin's and Skene's glands normal in appearance; urethral meatus normal in appearance, no urethral masses or discharge. The pessary was noted to be dislodged. It was removed and cleaned. Speculum exam revealed no lesions in the vagina. The pessary was replaced. It was comfortable to the patient and fit well.   Laboratory Results: Urine dipstick shows: negative for all components.    Assessment/Plan:    Assessment: Alexandra Gonzales is a 82 y.o. with stage II pelvic organ prolapse here for a pessary check. She is doing well.  Plan: She will keep the pessary in place until next visit. She will continue to use estrogen. She will follow-up in 3 months for a pessary check or sooner as needed.   Patient reports frequent bladder irritation with no obvious sign of infection. Will send pyridium to calm down bladder irritation.   She also is still having pain the pelvic floor along with pain in the right groin area. Referral placed to pelvic floor PT. Encouraged patient to commit some time to pelvic floor PT for her overall health and wellness. She reports "as long as they don't throw my legs over their neck like the last one".   All questions were answered.

## 2022-09-19 ENCOUNTER — Ambulatory Visit
Admission: RE | Admit: 2022-09-19 | Discharge: 2022-09-19 | Disposition: A | Discharge status: Home / Self Care | Payer: HMO | Source: Ambulatory Visit | Attending: Family Medicine | Admitting: Family Medicine

## 2022-09-19 DIAGNOSIS — Z1231 Encounter for screening mammogram for malignant neoplasm of breast: Secondary | ICD-10-CM | POA: Diagnosis not present

## 2022-10-06 ENCOUNTER — Ambulatory Visit: Payer: HMO | Attending: Obstetrics and Gynecology | Admitting: Physical Therapy

## 2022-10-06 ENCOUNTER — Other Ambulatory Visit: Payer: Self-pay

## 2022-10-06 ENCOUNTER — Encounter: Payer: Self-pay | Admitting: Physical Therapy

## 2022-10-06 DIAGNOSIS — K6289 Other specified diseases of anus and rectum: Secondary | ICD-10-CM | POA: Insufficient documentation

## 2022-10-06 DIAGNOSIS — N8110 Cystocele, unspecified: Secondary | ICD-10-CM | POA: Diagnosis not present

## 2022-10-06 DIAGNOSIS — M6281 Muscle weakness (generalized): Secondary | ICD-10-CM | POA: Diagnosis not present

## 2022-10-06 DIAGNOSIS — R350 Frequency of micturition: Secondary | ICD-10-CM | POA: Diagnosis not present

## 2022-10-06 DIAGNOSIS — R279 Unspecified lack of coordination: Secondary | ICD-10-CM | POA: Diagnosis not present

## 2022-10-06 DIAGNOSIS — K5904 Chronic idiopathic constipation: Secondary | ICD-10-CM | POA: Insufficient documentation

## 2022-10-06 DIAGNOSIS — N3941 Urge incontinence: Secondary | ICD-10-CM | POA: Insufficient documentation

## 2022-10-06 DIAGNOSIS — R278 Other lack of coordination: Secondary | ICD-10-CM

## 2022-10-06 DIAGNOSIS — M62838 Other muscle spasm: Secondary | ICD-10-CM | POA: Diagnosis not present
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
## 2022-10-13 ENCOUNTER — Encounter: Payer: Self-pay | Admitting: Physical Therapy

## 2022-10-13 ENCOUNTER — Ambulatory Visit: Payer: HMO | Attending: Obstetrics and Gynecology | Admitting: Physical Therapy

## 2022-10-13 DIAGNOSIS — M6281 Muscle weakness (generalized): Secondary | ICD-10-CM

## 2022-10-13 DIAGNOSIS — R278 Other lack of coordination: Secondary | ICD-10-CM | POA: Diagnosis not present
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
## 2022-10-18 DIAGNOSIS — Z6827 Body mass index (BMI) 27.0-27.9, adult: Secondary | ICD-10-CM | POA: Diagnosis not present

## 2022-10-18 DIAGNOSIS — N8110 Cystocele, unspecified: Secondary | ICD-10-CM | POA: Diagnosis not present

## 2022-10-18 DIAGNOSIS — Z01419 Encounter for gynecological examination (general) (routine) without abnormal findings: Secondary | ICD-10-CM | POA: Diagnosis not present

## 2022-10-20 ENCOUNTER — Ambulatory Visit: Payer: HMO | Admitting: Physical Therapy

## 2022-10-20 DIAGNOSIS — M6281 Muscle weakness (generalized): Secondary | ICD-10-CM

## 2022-10-20 DIAGNOSIS — R278 Other lack of coordination: Secondary | ICD-10-CM
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
## 2022-10-20 NOTE — Patient Instructions (Signed)
Overview The pelvic organs, including the bladder, are normally supported by pelvic floor muscles and ligaments.   When these muscles and ligaments are stretched, weakened or torn, the wall between the bladder and the vagina sags or herniates causing a prolapse, sometimes called a cystocele. This condition may cause discomfort and problems with emptying the bladder.  It can be present in various stages.  Some people are not aware of the changes. Others may notice changes at the vaginal opening or a feeling of the bladder dropping outside the body. Causes of a Cystocele A cystocele is usually caused by muscle straining or stretching during childbirth.  In addition, cystocele is more common after menopause, because the hormone estrogen helps keep the elastic tissues around the pelvic organs strong.  A cystocele is more likely to occur when levels of estrogen decrease. Other causes include: heavy lifting, chronic coughing, previous pelvic surgery and obesity.  Symptoms A bladder that has dropped from its normal position may cause: unwanted urine leakage (stress incontinence), frequent urination or urge to urinate, incomplete emptying of the bladder (not feeling bladder relief after emptying), pain or discomfort in the vagina, pelvis, groin, lower back or lower abdomen and frequent urinary tract infections.  Mild cases may not cause any symptoms.  Treatment Options Pelvic floor (Kegel) exercises: Strength training the muscles in your genital area  Behavioral changes: Treating and preventing constipation, taking time to empty your bladder properly, learning to lift properly and/or  avoid heavy lifting when possible, stopping smoking, avoiding weight gain and treating a chronic cough or bronchitis. A pessary: A vaginal support device is sometimes used to help pelvic support caused by muscle and ligament changes. Surgery: Aurgical repair may be necessary if symptoms cannot be managed with exercise, behavioral  changes and a pessary. Surgery is usually considered for severe cases.

## 2022-10-24 DIAGNOSIS — K219 Gastro-esophageal reflux disease without esophagitis: Secondary | ICD-10-CM | POA: Diagnosis not present

## 2022-10-24 DIAGNOSIS — Z23 Encounter for immunization: Secondary | ICD-10-CM | POA: Diagnosis not present

## 2022-10-24 DIAGNOSIS — K582 Mixed irritable bowel syndrome: Secondary | ICD-10-CM | POA: Diagnosis not present

## 2022-10-24 DIAGNOSIS — E7800 Pure hypercholesterolemia, unspecified: Secondary | ICD-10-CM | POA: Diagnosis not present

## 2022-10-24 DIAGNOSIS — I10 Essential (primary) hypertension: Secondary | ICD-10-CM | POA: Diagnosis not present

## 2022-10-24 DIAGNOSIS — M85851 Other specified disorders of bone density and structure, right thigh: Secondary | ICD-10-CM | POA: Diagnosis not present

## 2022-10-24 DIAGNOSIS — R7303 Prediabetes: Secondary | ICD-10-CM | POA: Diagnosis not present

## 2022-10-24 DIAGNOSIS — J309 Allergic rhinitis, unspecified: Secondary | ICD-10-CM | POA: Diagnosis not present

## 2022-10-25 ENCOUNTER — Other Ambulatory Visit: Payer: Self-pay | Admitting: Family Medicine

## 2022-10-25 DIAGNOSIS — M85859 Other specified disorders of bone density and structure, unspecified thigh: Secondary | ICD-10-CM

## 2022-10-27 ENCOUNTER — Encounter: Payer: HMO | Admitting: Physical Therapy

## 2022-11-21 ENCOUNTER — Ambulatory Visit: Payer: HMO | Admitting: Physical Therapy

## 2022-11-25 ENCOUNTER — Other Ambulatory Visit: Payer: Self-pay | Admitting: Family Medicine

## 2022-11-25 ENCOUNTER — Ambulatory Visit
Admission: RE | Admit: 2022-11-25 | Discharge: 2022-11-25 | Disposition: A | Discharge status: Home / Self Care | Payer: HMO | Source: Ambulatory Visit | Attending: Family Medicine | Admitting: Family Medicine

## 2022-11-25 DIAGNOSIS — M549 Dorsalgia, unspecified: Secondary | ICD-10-CM

## 2022-11-25 DIAGNOSIS — M546 Pain in thoracic spine: Secondary | ICD-10-CM | POA: Diagnosis not present

## 2022-11-30 NOTE — Progress Notes (Unsigned)
New Patient Note  RE: [name] MRN: [id number] DOB: [birth year] Date of Office Visit: 12/01/2022  Consult requested by: Noberto Retort, MD Primary care provider: Noberto Retort, MD  Chief Complaint: No chief complaint on file.  History of Present Illness: I had the pleasure of seeing Alexandra Gonzales for initial evaluation at the [facility] on 11/30/2022. She is a 82 y.o. female, who is referred here by Noberto Retort, MD for the evaluation of ***.  Discussed the use of AI scribe software for clinical note transcription with the patient, who gave verbal consent to proceed.  History of Present Illness             ***  Assessment and Plan: Elder is a 82 y.o. female with: ***  Assessment and Plan               No follow-ups on file.  No orders of the defined types were placed in this encounter.  Lab Orders  No laboratory test(s) ordered today    Other allergy screening: Asthma: {Blank single:19197::"yes","no"} Rhino conjunctivitis: {Blank single:19197::"yes","no"} Food allergy: {Blank single:19197::"yes","no"} Medication allergy: {Blank single:19197::"yes","no"} Hymenoptera allergy: {Blank single:19197::"yes","no"} Urticaria: {Blank single:19197::"yes","no"} Eczema:{Blank single:19197::"yes","no"} History of recurrent infections suggestive of immunodeficency: {Blank single:19197::"yes","no"}  Diagnostics: Spirometry:  Tracings reviewed. Her effort: {Blank single:19197::"Good reproducible efforts.","It was hard to get consistent efforts and there is a question as to whether this reflects a maximal maneuver.","Poor effort, data can not be interpreted."} FVC: ***L FEV1: ***L, ***% predicted FEV1/FVC ratio: ***% Interpretation: {Blank single:19197::"Spirometry consistent with mild obstructive disease","Spirometry consistent with moderate obstructive disease","Spirometry consistent with severe obstructive disease","Spirometry consistent  with possible restrictive disease","Spirometry consistent with mixed obstructive and restrictive disease","Spirometry uninterpretable due to technique","Spirometry consistent with normal pattern","No overt abnormalities noted given today's efforts"}.  Please see scanned spirometry results for details.  Skin Testing: {Blank single:19197::"Select foods","Environmental allergy panel","Environmental allergy panel and select foods","Food allergy panel","None","Deferred due to recent antihistamines use"}. *** Results discussed with patient/family.   Past Medical History: Patient Active Problem List   Diagnosis Date Noted  . Complicated UTI (urinary tract infection) 12/30/2020  . Leukocytosis 12/30/2020  . Elevated troponin 12/30/2020  . Essential hypertension 12/30/2020  . Allergic rhinitis 12/30/2020  . Facial spasm 02/29/2016  . Esophageal reflux 02/18/2016  . Neuropathy 02/18/2016   Past Medical History:  Diagnosis Date  . Allergy   . Anxiety   . Bilateral carotid artery stenosis   . Breast cancer (HCC)   . Bronchitis   . Cancer (HCC)    questionable cancer to nipple  . Chronic pain   . Colon spasm   . Diverticulitis   . Dysphagia   . Functional gait abnormality   . GERD (gastroesophageal reflux disease)   . Hyperlipidemia   . Hyperlipidemia   . Hypertension   . IBS (irritable bowel syndrome)   . Insomnia   . Kidney stones   . Low back pain   . Lumbar radiculopathy   . Myalgia   . Neuropathy   . Osteopenia   . Personal history of radiation therapy   . Sciatica   . Sleep disorder    Past Surgical History: Past Surgical History:  Procedure Laterality Date  . ABDOMINAL HYSTERECTOMY    . BREAST EXCISIONAL BIOPSY    . BREAST LUMPECTOMY Left    march 2000  . BREAST SURGERY     R lumpectomy 2000  . CATARACT EXTRACTION, BILATERAL    . EXPLORATORY LAPAROTOMY    .  EXPLORATORY LAPAROTOMY    . EYE SURGERY     bilat cataract removal  . MOUTH SURGERY    . RADIOACTIVE  SEED GUIDED EXCISIONAL BREAST BIOPSY Left 09/26/2016   Procedure: LEFT RADIOACTIVE SEED GUIDED EXCISIONAL BREAST BIOPSY ERAS PATHWAY;  Surgeon: Emelia Loron, MD;  Location: [facility];  Service: General;  Laterality: Left;  LMA   Medication List:  Current Outpatient Medications  Medication Sig Dispense Refill  . acetaminophen (TYLENOL) 500 MG tablet Take 500 mg by mouth every 6 (six) hours as needed.    Marland Kitchen amLODipine (NORVASC) 5 MG tablet Take 5 mg by mouth at bedtime.    . dicyclomine (BENTYL) 10 MG capsule Take 10 mg by mouth 4 (four) times daily -  before meals and at bedtime.    Marland Kitchen estradiol (ESTRACE) 0.1 MG/GM vaginal cream Place vaginally.    . famotidine (PEPCID) 40 MG tablet Take 40 mg by mouth daily.    Marland Kitchen loratadine (CLARITIN) 10 MG tablet Take 10 mg by mouth daily.    Marland Kitchen losartan (COZAAR) 100 MG tablet Take 100 mg by mouth daily.    . Melatonin 10 MG TABS Take by mouth.    . montelukast (SINGULAIR) 10 MG tablet Take 1 tablet (10 mg total) by mouth at bedtime. 90 tablet 3  . pantoprazole (PROTONIX) 20 MG tablet Take 20 mg by mouth 2 (two) times daily.    . phenazopyridine (PYRIDIUM) 95 MG tablet Take 1 tablet (95 mg total) by mouth 3 (three) times daily as needed for pain. 30 tablet 1  . Polyvinyl Alcohol-Povidone (REFRESH OP) Place 1 drop into both eyes daily as needed (dry eyes).    . pramoxine-hydrocortisone (PROCTOCREAM-HC) 1-1 % rectal cream Place 1 Application rectally 2 (two) times daily. 30 g 0  . Probiotic Product (PROBIOTIC-10) CAPS Take 1 capsule by mouth daily.     No current facility-administered medications for this visit.   Allergies: Allergies  Allergen Reactions  . Morphine And Codeine Other (See Comments)    hallucinations  . Codeine Nausea And Vomiting  . Dexamethasone Nausea Only  . Erythromycin Rash  . Hydrocodone Nausea Only  . Lyrica [Pregabalin] Nausea And Vomiting  . Prevacid [Lansoprazole] Photosensitivity  . Clarithromycin   .  Famotidine     Affected memory  . Latex Itching  . Nitrofuran Derivatives     Shaking, felt bad  . Polytrim [Polymyxin B-Trimethoprim] Other (See Comments)    Sores in mouth, decreases memory  . Prednisone Other (See Comments)    Caused neck swelling  . Ranitidine Other (See Comments)  . Sulfa Antibiotics Other (See Comments)    Fatigue, shaking, headache - patient believes it may be an allergy  . Zantac [Ranitidine Hcl]     Affected memory  . Penicillins Rash   Social History: Social History   Socioeconomic History  . Marital status: Widowed    Spouse name: Not on file  . Number of children: 2  . Years of education: 10th  . Highest education level: Not on file  Occupational History  . Occupation: Retired  Tobacco Use  . Smoking status: Never  . Smokeless tobacco: Never  Vaping Use  . Vaping status: Never Used  Substance and Sexual Activity  . Alcohol use: No  . Drug use: No  . Sexual activity: Not Currently    Birth control/protection: Surgical, Post-menopausal    Comment: Hysterectomy  Other Topics Concern  . Not on file  Social History Narrative  Lives at home alone.   Right-handed.   Occasional caffeine use.   Social Determinants of Health   Financial Resource Strain: Not on file  Food Insecurity: Not on file  Transportation Needs: Not on file  Physical Activity: Not on file  Stress: Not on file  Social Connections: Unknown (05/25/2021)   [facility]   Social Network   . Social Network: Not on file   Lives in a ***. Smoking: *** Occupation: ***  Environmental HistorySurveyor, minerals in the house: Copywriter, advertising in the family room: {Blank single:19197::"yes","no"} Carpet in the bedroom: {Blank single:19197::"yes","no"} Heating: {Blank single:19197::"electric","gas","heat pump"} Cooling: {Blank single:19197::"central","window","heat pump"} Pet: {Blank single:19197::"yes  ***","no"}  Family History: Family History  Problem Relation Age of Onset  . Congestive Heart Failure Mother        Died 65  . Kidney Stones Father        Died 66 - complications from kidney stone  . Breast cancer Sister 62  . Leukemia Maternal Uncle   . Colon cancer Maternal Uncle    Problem                               Relation Asthma                                   *** Eczema                                *** Food allergy                          *** Allergic rhino conjunctivitis     ***  Review of Systems  Constitutional:  Negative for appetite change, chills, fever and unexpected weight change.  HENT:  Negative for congestion and rhinorrhea.   Eyes:  Negative for itching.  Respiratory:  Negative for cough, chest tightness, shortness of breath and wheezing.   Cardiovascular:  Negative for chest pain.  Gastrointestinal:  Negative for abdominal pain.  Genitourinary:  Negative for difficulty urinating.  Skin:  Negative for rash.  Neurological:  Negative for headaches.   Objective: LMP  (LMP Unknown)  There is no height or weight on file to calculate BMI. Physical Exam Vitals and nursing note reviewed.  Constitutional:      Appearance: Normal appearance. She is well-developed.  HENT:     Head: Normocephalic and atraumatic.     Right Ear: Tympanic membrane and external ear normal.     Left Ear: Tympanic membrane and external ear normal.     Nose: Nose normal.     Mouth/Throat:     Mouth: Mucous membranes are moist.     Pharynx: Oropharynx is clear.  Eyes:     Conjunctiva/sclera: Conjunctivae normal.  Cardiovascular:     Rate and Rhythm: Normal rate and regular rhythm.     Heart sounds: Normal heart sounds. No murmur heard.    No friction rub. No gallop.  Pulmonary:     Effort: Pulmonary effort is normal.     Breath sounds: Normal breath sounds. No wheezing, rhonchi or rales.  Musculoskeletal:     Cervical back: Neck supple.  Skin:    General: Skin is warm.      Findings: No rash.  Neurological:     Mental Status: She is alert and oriented to person, place, and time.  Psychiatric:        Behavior: Behavior normal.  The plan was reviewed with the patient/family, and all questions/concerned were addressed.  It was my pleasure to see Adiba today and participate in her care. Please feel free to contact me with any questions or concerns.  Sincerely,  Wyline Mood, DO Allergy & Immunology  [facility] office: [phone] [facility] office: [phone]

## 2022-12-01 ENCOUNTER — Other Ambulatory Visit: Payer: Self-pay

## 2022-12-01 ENCOUNTER — Ambulatory Visit: Payer: HMO | Attending: Obstetrics and Gynecology | Admitting: Physical Therapy

## 2022-12-01 ENCOUNTER — Encounter: Payer: Self-pay | Admitting: Allergy

## 2022-12-01 ENCOUNTER — Ambulatory Visit: Payer: HMO | Admitting: Allergy

## 2022-12-01 VITALS — BP 134/84 | HR 95 | Temp 98.0°F | Resp 16 | Wt 151.0 lb

## 2022-12-01 DIAGNOSIS — M6281 Muscle weakness (generalized): Secondary | ICD-10-CM | POA: Insufficient documentation

## 2022-12-01 DIAGNOSIS — Z889 Allergy status to unspecified drugs, medicaments and biological substances status: Secondary | ICD-10-CM

## 2022-12-01 DIAGNOSIS — R058 Other specified cough: Secondary | ICD-10-CM

## 2022-12-01 DIAGNOSIS — J3089 Other allergic rhinitis: Secondary | ICD-10-CM

## 2022-12-01 DIAGNOSIS — L299 Pruritus, unspecified: Secondary | ICD-10-CM | POA: Diagnosis not present

## 2022-12-01 DIAGNOSIS — T781XXD Other adverse food reactions, not elsewhere classified, subsequent encounter: Secondary | ICD-10-CM

## 2022-12-01 DIAGNOSIS — R278 Other lack of coordination: Secondary | ICD-10-CM | POA: Insufficient documentation

## 2022-12-01 DIAGNOSIS — L2089 Other atopic dermatitis: Secondary | ICD-10-CM | POA: Diagnosis not present

## 2022-12-01 MED ORDER — HYDROCORTISONE 2.5 % EX CREA: TOPICAL_CREAM | Freq: Two times a day (BID) | CUTANEOUS | 2 refills | Status: AC | PRN

## 2022-12-01 NOTE — Patient Instructions (Addendum)
Skin Use hydrocortisone 2.5% cream twice a day as needed for mild rash flares - okay to use on the face, neck, groin area. Do not use more than 1 week at a time. See below for proper skin care. Use fragrance free and dye free products. No dryer sheets or fabric softener.    Itching  Start Singulair (montelukast) 10mg  daily at night. Cautioned that in some children/adults can experience behavioral changes including hyperactivity, agitation, depression, sleep disturbances and suicidal ideations. These side effects are rare, but if you notice them you should notify me and discontinue Singulair (montelukast).  Stop neosporin.  Follow up in 1 month or sooner if needed.   Skin care recommendations  Bath time: Always use lukewarm water. AVOID very hot or cold water. Keep bathing time to 5-10 minutes. Do NOT use bubble bath. Use a mild soap and use just enough to wash the dirty areas. Do NOT scrub skin vigorously.  After bathing, pat dry your skin with a towel. Do NOT rub or scrub the skin.  Moisturizers and prescriptions:  ALWAYS apply moisturizers immediately after bathing (within 3 minutes). This helps to lock-in moisture. Use the moisturizer several times a day over the whole body. Good summer moisturizers include: Aveeno, CeraVe, Cetaphil. Good winter moisturizers include: Aquaphor, Vaseline, Cerave, Cetaphil, Eucerin, Vanicream. When using moisturizers along with medications, the moisturizer should be applied about one hour after applying the medication to prevent diluting effect of the medication or moisturize around where you applied the medications. When not using medications, the moisturizer can be continued twice daily as maintenance.  Laundry and clothing: Avoid laundry products with added color or perfumes. Use unscented hypo-allergenic laundry products such as Tide free, Cheer free & gentle, and All free and clear.  If the skin still seems dry or sensitive, you can try  double-rinsing the clothes. Avoid tight or scratchy clothing such as wool. Do not use fabric softeners or dyer sheets.   Pet Allergen Avoidance: Contrary to popular opinion, there are no "hypoallergenic" breeds of dogs or cats. That is because people are not allergic to an animal's hair, but to an allergen found in the animal's saliva, dander (dead skin flakes) or urine. Pet allergy symptoms typically occur within minutes. For some people, symptoms can build up and become most severe 8 to 12 hours after contact with the animal. People with severe allergies can experience reactions in public places if dander has been transported on the pet owners' clothing. Keeping an animal outdoors is only a partial solution, since homes with pets in the yard still have higher concentrations of animal allergens. Before getting a pet, ask your allergist to determine if you are allergic to animals. If your pet is already considered part of your family, try to minimize contact and keep the pet out of the bedroom and other rooms where you spend a great deal of time. As with dust mites, vacuum carpets often or replace carpet with a hardwood floor, tile or linoleum. High-efficiency particulate air (HEPA) cleaners can reduce allergen levels over time. While dander and saliva are the source of cat and dog allergens, urine is the source of allergens from rabbits, hamsters, mice and [place] pigs; so ask a non-allergic family member to clean the animal's cage. If you have a pet allergy, talk to your allergist about the potential for allergy immunotherapy (allergy shots). This strategy can often provide long-term relief.

## 2022-12-02 ENCOUNTER — Telehealth: Payer: Self-pay | Admitting: Physical Therapy

## 2022-12-02 DIAGNOSIS — C50911 Malignant neoplasm of unspecified site of right female breast: Secondary | ICD-10-CM | POA: Diagnosis not present

## 2022-12-02 NOTE — Telephone Encounter (Signed)
Called pt, she said she was feeling better and wanted to cancel next week's PT session

## 2022-12-05 ENCOUNTER — Encounter: Payer: HMO | Admitting: Physical Therapy

## 2022-12-21 ENCOUNTER — Ambulatory Visit: Payer: HMO | Admitting: Obstetrics and Gynecology

## 2022-12-23 DIAGNOSIS — Z03818 Encounter for observation for suspected exposure to other biological agents ruled out: Secondary | ICD-10-CM | POA: Diagnosis not present

## 2022-12-23 DIAGNOSIS — J189 Pneumonia, unspecified organism: Secondary | ICD-10-CM | POA: Diagnosis not present

## 2022-12-23 DIAGNOSIS — R8290 Unspecified abnormal findings in urine: Secondary | ICD-10-CM | POA: Diagnosis not present

## 2022-12-23 DIAGNOSIS — R1084 Generalized abdominal pain: Secondary | ICD-10-CM | POA: Diagnosis not present

## 2022-12-23 DIAGNOSIS — R509 Fever, unspecified: Secondary | ICD-10-CM | POA: Diagnosis not present

## 2022-12-26 ENCOUNTER — Ambulatory Visit (INDEPENDENT_AMBULATORY_CARE_PROVIDER_SITE_OTHER): Payer: HMO | Admitting: Allergy

## 2022-12-26 ENCOUNTER — Other Ambulatory Visit: Payer: Self-pay

## 2022-12-26 ENCOUNTER — Encounter: Payer: Self-pay | Admitting: Allergy

## 2022-12-26 VITALS — BP 124/50 | HR 55 | Temp 98.2°F | Resp 16 | Wt 149.2 lb

## 2022-12-26 DIAGNOSIS — R21 Rash and other nonspecific skin eruption: Secondary | ICD-10-CM

## 2022-12-26 DIAGNOSIS — J189 Pneumonia, unspecified organism: Secondary | ICD-10-CM | POA: Diagnosis not present

## 2022-12-26 NOTE — Patient Instructions (Addendum)
Skin Use hydrocortisone 2.5% cream twice a day as needed for mild rash flares - okay to use on the face, neck, groin area. Do not use more than 1 week at a time. Continue proper skin care. Use fragrance free and dye free products. No dryer sheets or fabric softener.    Consider seeing a dermatologist next. Stop using neosporin.   Follow up as needed.  Skin care recommendations  Bath time: Always use lukewarm water. AVOID very hot or cold water. Keep bathing time to 5-10 minutes. Do NOT use bubble bath. Use a mild soap and use just enough to wash the dirty areas. Do NOT scrub skin vigorously.  After bathing, pat dry your skin with a towel. Do NOT rub or scrub the skin.  Moisturizers and prescriptions:  ALWAYS apply moisturizers immediately after bathing (within 3 minutes). This helps to lock-in moisture. Use the moisturizer several times a day over the whole body. Good summer moisturizers include: Aveeno, CeraVe, Cetaphil. Good winter moisturizers include: Aquaphor, Vaseline, Cerave, Cetaphil, Eucerin, Vanicream. When using moisturizers along with medications, the moisturizer should be applied about one hour after applying the medication to prevent diluting effect of the medication or moisturize around where you applied the medications. When not using medications, the moisturizer can be continued twice daily as maintenance.  Laundry and clothing: Avoid laundry products with added color or perfumes. Use unscented hypo-allergenic laundry products such as Tide free, Cheer free & gentle, and All free and clear.  If the skin still seems dry or sensitive, you can try double-rinsing the clothes. Avoid tight or scratchy clothing such as wool. Do not use fabric softeners or dyer sheets.

## 2022-12-26 NOTE — Progress Notes (Signed)
Follow Up Note  RE: Alexandra Gonzales MRN: [id number] DOB: 02-11-1940 Date of Office Visit: 12/26/2022  Referring provider: Noberto Retort, MD Primary care provider: Noberto Retort, MD  Chief Complaint: Follow-up and Allergies  History of Present Illness: I had the pleasure of seeing Alexandra Gonzales for a follow up visit at the [facility] on 12/26/2022. She is a 82 y.o. female, who is being followed for atopic dermatitis, pruritus, allergic rhinitis, coughing, multiple drug allergies. Her previous allergy office visit was on 12/01/2022 with Dr. Selena Batten. Today is a regular follow up visit.  Discussed the use of AI scribe software for clinical note transcription with the patient, who gave verbal consent to proceed.  The patient, with a history of skin rashes, presents with persistent but improving skin lesions. She reports that the rashes are not as severe as before, but she continues to recur. The rashes are localized to specific areas on the face and respond to topical hydrocortisone application. The patient also reports a recent episode of pneumonia, for which she received antibiotic treatment. She is currently recovering, albeit with residual weakness.  The patient denies any significant itching associated with the rashes, a symptom that was previously present.   In terms of her skin care, the patient is using products that are safe for sensitive skin, specifically a product called 'number seven'. Despite the persistent rashes, the patient reports no worsening of the skin condition. She is open to continuing the current treatment plan and reassessing in a couple of months.  The patient also mentions taking an allergy medication, possibly Zyrtec, but never started singulair due to concerns about potential depressive side effects.   Assessment and Plan: Alexandra Gonzales is a 82 y.o. female with: Rash and other nonspecific skin eruption Past history - Persistent facial rash for the past  month, sometimes itchy, dry feeling, and not resolving. Appearance suggestive of eczema. Patient has been applying Neosporin, which could potentially be making this worse.  Interim history - Persistent, but not worsening. No longer itchy. Patient has been using hydrocortisone cream and Zyrtec. Use hydrocortisone 2.5% cream twice a day as needed for mild rash flares - okay to use on the face, neck, groin area. Do not use more than 1 week at a time. Continue proper skin care. Use fragrance free and dye free products. No dryer sheets or fabric softener.   Consider seeing a dermatologist next. Stop using neosporin.   Return if symptoms worsen or fail to improve.  No orders of the defined types were placed in this encounter.  Lab Orders  No laboratory test(s) ordered today    Diagnostics: None.  Medication List:  Current Outpatient Medications  Medication Sig Dispense Refill   acetaminophen (TYLENOL) 500 MG tablet Take 500 mg by mouth every 6 (six) hours as needed.     amLODipine (NORVASC) 5 MG tablet Take 5 mg by mouth at bedtime.     estradiol (ESTRACE) 0.1 MG/GM vaginal cream Place vaginally.     hydrocortisone 2.5 % cream Apply topically 2 (two) times daily as needed (eczema). 30 g 2   levofloxacin (LEVAQUIN) 500 MG tablet Take 500 mg by mouth daily.     losartan (COZAAR) 100 MG tablet Take 100 mg by mouth daily.     Melatonin 10 MG TABS Take by mouth.     Polyvinyl Alcohol-Povidone (REFRESH OP) Place 1 drop into both eyes daily as needed (dry eyes).     pramoxine-hydrocortisone (PROCTOCREAM-HC) 1-1 %  rectal cream Place 1 Application rectally 2 (two) times daily. 30 g 0   Probiotic Product (PROBIOTIC-10) CAPS Take 1 capsule by mouth daily.     dicyclomine (BENTYL) 10 MG capsule Take 10 mg by mouth 4 (four) times daily -  before meals and at bedtime.     No current facility-administered medications for this visit.   Allergies: Allergies  Allergen Reactions   Morphine And Codeine  Other (See Comments)    hallucinations   Codeine Nausea And Vomiting   Dexamethasone Nausea Only   Erythromycin Rash   Hydrocodone Nausea Only   Lyrica [Pregabalin] Nausea And Vomiting   Prevacid [Lansoprazole] Photosensitivity   Clarithromycin    Famotidine     Affected memory   Latex Itching   Nitrofuran Derivatives     Shaking, felt bad   Polytrim [Polymyxin B-Trimethoprim] Other (See Comments)    Sores in mouth, decreases memory   Prednisone Other (See Comments)    Caused neck swelling   Ranitidine Other (See Comments)   Sulfa Antibiotics Other (See Comments)    Fatigue, shaking, headache - patient believes it may be an allergy   Zantac [Ranitidine Hcl]     Affected memory   Penicillins Rash   I reviewed her past medical history, social history, family history, and environmental history and no significant changes have been reported from her previous visit.  Review of Systems  Constitutional:  Negative for appetite change, chills, fever and unexpected weight change.  HENT:  Negative for congestion and rhinorrhea.   Eyes:  Negative for itching.  Respiratory:  Negative for cough, chest tightness, shortness of breath and wheezing.   Cardiovascular:  Negative for chest pain.  Gastrointestinal:  Negative for abdominal pain.  Genitourinary:  Negative for difficulty urinating.  Skin:  Positive for rash.  Neurological:  Negative for headaches.    Objective: BP (!) 124/50 (BP Location: Left Arm, Patient Position: Sitting, Cuff Size: Normal)   Pulse (!) 55   Temp 98.2 F (36.8 C) (Temporal)   Resp 16   Wt 149 lb 3.2 oz (67.7 kg)   LMP  (LMP Unknown)   SpO2 96%   BMI 27.29 kg/m  Body mass index is 27.29 kg/m. Physical Exam Vitals and nursing note reviewed.  Constitutional:      Appearance: Normal appearance. She is well-developed.  HENT:     Head: Normocephalic and atraumatic.     Right Ear: Tympanic membrane and external ear normal.     Left Ear: Tympanic membrane  and external ear normal.     Nose: Nose normal.     Mouth/Throat:     Mouth: Mucous membranes are moist.     Pharynx: Oropharynx is clear.  Eyes:     Conjunctiva/sclera: Conjunctivae normal.  Cardiovascular:     Rate and Rhythm: Normal rate and regular rhythm.     Heart sounds: Normal heart sounds. No murmur heard.    No friction rub. No gallop.  Pulmonary:     Effort: Pulmonary effort is normal.     Breath sounds: Normal breath sounds. No wheezing, rhonchi or rales.  Musculoskeletal:     Cervical back: Neck supple.  Skin:    General: Skin is warm.     Findings: No rash.     Comments: A few dry patches on the face but no erythema noted.  Neurological:     Mental Status: She is alert and oriented to person, place, and time.  Psychiatric:  Behavior: Behavior normal.   Previous notes and tests were reviewed. The plan was reviewed with the patient/family, and all questions/concerned were addressed.  It was my pleasure to see Alexandra Gonzales today and participate in her care. Please feel free to contact me with any questions or concerns.  Sincerely,  Wyline Mood, DO Allergy & Immunology  [facility] office: [phone] [facility] office: [phone]

## 2023-01-10 ENCOUNTER — Other Ambulatory Visit: Payer: Self-pay | Admitting: Family Medicine

## 2023-01-10 DIAGNOSIS — M79604 Pain in right leg: Secondary | ICD-10-CM

## 2023-01-10 DIAGNOSIS — M5416 Radiculopathy, lumbar region: Secondary | ICD-10-CM | POA: Diagnosis not present

## 2023-01-10 DIAGNOSIS — R600 Localized edema: Secondary | ICD-10-CM | POA: Diagnosis not present

## 2023-01-10 DIAGNOSIS — M79605 Pain in left leg: Secondary | ICD-10-CM | POA: Diagnosis not present

## 2023-01-18 ENCOUNTER — Ambulatory Visit: Payer: HMO | Admitting: Obstetrics and Gynecology

## 2023-01-18 ENCOUNTER — Encounter: Payer: Self-pay | Admitting: Obstetrics and Gynecology

## 2023-01-18 VITALS — BP 152/74 | HR 69

## 2023-01-18 DIAGNOSIS — N8110 Cystocele, unspecified: Secondary | ICD-10-CM

## 2023-01-18 DIAGNOSIS — N812 Incomplete uterovaginal prolapse: Secondary | ICD-10-CM

## 2023-01-18 DIAGNOSIS — K6289 Other specified diseases of anus and rectum: Secondary | ICD-10-CM

## 2023-01-18 DIAGNOSIS — M62838 Other muscle spasm: Secondary | ICD-10-CM

## 2023-01-18 NOTE — Progress Notes (Signed)
 [facility]   Subjective:     Chief Complaint:  Chief Complaint  Patient presents with   Follow-up   History of Present Illness: Alexandra Gonzales is a 83 y.o. female with stage II pelvic organ prolapse who presents for a pessary check. She previously had a size #2 short stem gellhorn pessary but was seen by GYN who removed it for the exam then felt it should not be replaced due to causing irritation. She is still having pain around the rectal area, which had been present before the pessary as well. Has been using estrogen cream nightly.   She had been to a few sessions of pelvic PT but the location was too far for her so she did not want to continue.   Past Medical History: Patient  has a past medical history of Allergy , Anxiety, Bilateral carotid artery stenosis, Breast cancer (HCC), Bronchitis, Cancer (HCC), Chronic pain, Colon spasm, Diverticulitis, Dysphagia, Functional gait abnormality, GERD (gastroesophageal reflux disease), Hyperlipidemia, Hyperlipidemia, Hypertension, IBS (irritable bowel syndrome), Insomnia, Kidney stones, Low back pain, Lumbar radiculopathy, Myalgia, Neuropathy, Osteopenia, Personal history of radiation therapy, Sciatica, and Sleep disorder.   Past Surgical History: She  has a past surgical history that includes Abdominal hysterectomy; Exploratory laparotomy; Breast surgery; Eye surgery; Mouth surgery; Breast lumpectomy (Left); Radioactive seed guided excisional breast biopsy (Left, 09/26/2016); Breast excisional biopsy; Exploratory laparotomy; and Cataract extraction, bilateral.   Medications: She has a current medication list which includes the following prescription(s): acetaminophen , amlodipine , dicyclomine, estradiol, hydrocortisone , levofloxacin, losartan , melatonin, polyvinyl alcohol-povidone, pramoxine-hydrocortisone , and probiotic-10.   Allergies: Patient is allergic to morphine  and codeine, codeine, dexamethasone , erythromycin, hydrocodone,  lyrica [pregabalin], prevacid [lansoprazole], clarithromycin, famotidine , latex, nitrofuran derivatives, polytrim [polymyxin b-trimethoprim], prednisone, ranitidine, sulfa antibiotics, zantac [ranitidine hcl], and penicillins.   Social History: Patient  reports that she has never smoked. She has never used smokeless tobacco. She reports that she does not drink alcohol and does not use drugs.      Objective:    Physical Exam: BP (!) 162/74   Pulse 67   LMP  (LMP Unknown)  Gen: No apparent distress, A&O x 3. Detailed Urogynecologic Evaluation:  deferred   Assessment/Plan:    Assessment: Alexandra Gonzales is a 83 y.o. with stage II pelvic organ prolapse   Plan: - We discussed trying another pessary vs expectant management. She prefers expectant management since she has been having more pain in her hip and sciatica and wants to address that first.  - [facility] for her. We discussed increase pelvic floor muscle tension may be contributing to some of her discomfort. - Decrease estrace cream to 3x per week. Can use coconut oil as needed.   Return 4 months or sooner if needed  Alexandra LOISE Caper, MD  Time spent: I spent 25 minutes dedicated to the care of this patient on the date of this encounter to include pre-visit review of records, face-to-face time with the patient  and post visit documentation.

## 2023-01-19 ENCOUNTER — Ambulatory Visit
Admission: RE | Admit: 2023-01-19 | Discharge: 2023-01-19 | Disposition: A | Discharge status: Home / Self Care | Payer: HMO | Source: Ambulatory Visit | Attending: Family Medicine | Admitting: Family Medicine

## 2023-01-19 DIAGNOSIS — M79604 Pain in right leg: Secondary | ICD-10-CM

## 2023-03-22 NOTE — Therapy (Incomplete)
 OUTPATIENT PHYSICAL THERAPY FEMALE PELVIC EVALUATION   Patient Name: Alexandra Gonzales MRN: [id number] DOB:30-Jun-1940, 83 y.o., female Today's Date: 03/22/2023  END OF SESSION:   Past Medical History:  Diagnosis Date   Allergy    Anxiety    Bilateral carotid artery stenosis    Breast cancer (HCC)    Bronchitis    Cancer (HCC)    questionable cancer to nipple   Chronic pain    Colon spasm    Diverticulitis    Dysphagia    Functional gait abnormality    GERD (gastroesophageal reflux disease)    Hyperlipidemia    Hyperlipidemia    Hypertension    IBS (irritable bowel syndrome)    Insomnia    Kidney stones    Low back pain    Lumbar radiculopathy    Myalgia    Neuropathy    Osteopenia    Personal history of radiation therapy    Sciatica    Sleep disorder    Past Surgical History:  Procedure Laterality Date   ABDOMINAL HYSTERECTOMY     BREAST EXCISIONAL BIOPSY     BREAST LUMPECTOMY Left    march 2000   BREAST SURGERY     R lumpectomy 2000   CATARACT EXTRACTION, BILATERAL     EXPLORATORY LAPAROTOMY     EXPLORATORY LAPAROTOMY     EYE SURGERY     bilat cataract removal   MOUTH SURGERY     RADIOACTIVE SEED GUIDED EXCISIONAL BREAST BIOPSY Left 09/26/2016   Procedure: LEFT RADIOACTIVE SEED GUIDED EXCISIONAL BREAST BIOPSY ERAS PATHWAY;  Surgeon: Emelia Loron, MD;  Location: [facility];  Service: General;  Laterality: Left;  LMA   Patient Active Problem List   Diagnosis Date Noted   Complicated UTI (urinary tract infection) 12/30/2020   Leukocytosis 12/30/2020   Elevated troponin 12/30/2020   Essential hypertension 12/30/2020   Allergic rhinitis 12/30/2020   Facial spasm 02/29/2016   Esophageal reflux 02/18/2016   Neuropathy 02/18/2016    PCP: Noberto Retort, MD  REFERRING PROVIDER: Marguerita Beards, MD   REFERRING DIAG:  [phone] (ICD-10-CM) - Levator spasm  K62.89 (ICD-10-CM) - Rectal pain  N81.10 (ICD-10-CM) - Prolapse of  anterior vaginal wall    THERAPY DIAG:  No diagnosis found.  Rationale for Evaluation and Treatment: Rehabilitation  ONSET DATE: ***  SUBJECTIVE:                                                                                                                                                                                           SUBJECTIVE STATEMENT: Uses estrogen cream Fluid intake:   PAIN:  Are you having pain? {yes/no:20286} NPRS scale: ***/10 Pain location: {pelvic pain location:27098}  Pain type: {type:313116} Pain description: {PAIN DESCRIPTION:21022940}   Aggravating factors: *** Relieving factors: ***  PRECAUTIONS: Other: cancer  RED FLAGS: {PT Red Flags:29287}   WEIGHT BEARING RESTRICTIONS: No  FALLS:  Has patient fallen in last 6 months? No  OCCUPATION: ***  ACTIVITY LEVEL : ***  PLOF: {PLOF:24004}  PATIENT GOALS: ***  PERTINENT HISTORY:  Breast cancer with radiation; Diverticulitis; Osteopenia; Abcominal hysterectomy; Exploratory laparotomy;  Sexual abuse: {Yes/No:304960894}  BOWEL MOVEMENT: Pain with bowel movement: {yes/no:20286} Type of bowel movement:{PT BM type:27100} Fully empty rectum: {No/Yes:304960894} Leakage: {Yes/No:304960894} Pads: {Yes/No:304960894} Fiber supplement/laxative {YES/NO AS:20300}  URINATION: Pain with urination: {yes/no:20286} Fully empty bladder: {Yes/No:304960894}*** Stream: {PT urination:27102} Urgency: {YES/NO AS:20300} Frequency: *** Leakage: {PT leakage:27103} Pads: {Yes/No:304960894}  INTERCOURSE:  Ability to have vaginal penetration {YES/NO:21197} Pain with intercourse: {pain with intercourse PA:27099} Dryness{YES/NO AS:20300} Climax: *** Marinoff Scale: ***/3 Laxative:  PREGNANCY: Vaginal deliveries *** Tearing {Yes***/No:304960894} Episiotomy {YES/NO AS:20300} C-section deliveries *** Currently pregnant {Yes***/No:304960894}  PROLAPSE: {PT prolapse:27101}   OBJECTIVE:  Note:  Objective measures were completed at Evaluation unless otherwise noted.  DIAGNOSTIC FINDINGS:  ***  PATIENT SURVEYS:  {rehab surveys:24030}  PFIQ-7: ***  COGNITION: Overall cognitive status: {cognition:24006}     SENSATION: Light touch: {intact/deficits:24005}  LUMBAR SPECIAL TESTS:  {lumbar special test:25242}  FUNCTIONAL TESTS:  {Functional tests:24029}  GAIT: Assistive device utilized: {Assistive devices:23999} Comments: ***  POSTURE: {posture:25561}   LUMBARAROM/PROM:  A/PROM A/PROM  eval  Flexion   Extension   Right lateral flexion   Left lateral flexion   Right rotation   Left rotation    (Blank rows = not tested)  LOWER EXTREMITY ROM:  {AROM/PROM:27142} ROM Right eval Left eval  Hip flexion    Hip extension    Hip abduction    Hip adduction    Hip internal rotation    Hip external rotation    Knee flexion    Knee extension    Ankle dorsiflexion    Ankle plantarflexion    Ankle inversion    Ankle eversion     (Blank rows = not tested)  LOWER EXTREMITY MMT:  MMT Right eval Left eval  Hip flexion    Hip extension    Hip abduction    Hip adduction    Hip internal rotation    Hip external rotation    Knee flexion    Knee extension    Ankle dorsiflexion    Ankle plantarflexion    Ankle inversion    Ankle eversion     (Blank rows = not tested) PALPATION:   General: ***  Pelvic Alignment: ***  Abdominal: ***                External Perineal Exam: ***                             Internal Pelvic Floor: ***  Patient confirms identification and approves PT to assess internal pelvic floor and treatment {yes/no:20286}  PELVIC MMT:   MMT eval  Vaginal   Internal Anal Sphincter   External Anal Sphincter   Puborectalis   Diastasis Recti   (Blank rows = not tested)        TONE: ***  PROLAPSE: ***  TODAY'S TREATMENT:  DATE: ***  EVAL ***   PATIENT EDUCATION:  Education details: *** Person educated: {Person educated:25204} Education method: {Education Method:25205} Education comprehension: {Education Comprehension:25206}  HOME EXERCISE PROGRAM: ***  ASSESSMENT:  CLINICAL IMPRESSION: Patient is a *** y.o. *** who was seen today for physical therapy evaluation and treatment for ***.   OBJECTIVE IMPAIRMENTS: {opptimpairments:25111}.   ACTIVITY LIMITATIONS: {activitylimitations:27494}  PARTICIPATION LIMITATIONS: {participationrestrictions:25113}  PERSONAL FACTORS: {Personal factors:25162} are also affecting patient's functional outcome.   REHAB POTENTIAL: {rehabpotential:25112}  CLINICAL DECISION MAKING: {clinical decision making:25114}  EVALUATION COMPLEXITY: {Evaluation complexity:25115}   GOALS: Goals reviewed with patient? {yes/no:20286}  SHORT TERM GOALS: Target date: ***  *** Baseline: Goal status: INITIAL  2.  *** Baseline:  Goal status: INITIAL  3.  *** Baseline:  Goal status: INITIAL  4.  *** Baseline:  Goal status: INITIAL  5.  *** Baseline:  Goal status: INITIAL  6.  *** Baseline:  Goal status: INITIAL  LONG TERM GOALS: Target date: ***  *** Baseline:  Goal status: INITIAL  2.  *** Baseline:  Goal status: INITIAL  3.  *** Baseline:  Goal status: INITIAL  4.  *** Baseline:  Goal status: INITIAL  5.  *** Baseline:  Goal status: INITIAL  6.  *** Baseline:  Goal status: INITIAL  PLAN:  PT FREQUENCY: {rehab frequency:25116}  PT DURATION: {rehab duration:25117}  PLANNED INTERVENTIONS: {rehab planned interventions:25118::"97110-Therapeutic exercises","97530- Therapeutic [phone]- Neuromuscular re-education","97535- Self UUVO","53664- Manual therapy"}  PLAN FOR NEXT SESSION: ***   Donetta Isaza, PT 03/22/2023, 9:28 AM

## 2023-03-22 NOTE — Therapy (Unsigned)
 OUTPATIENT PHYSICAL THERAPY FEMALE PELVIC EVALUATION   Patient Name: Alexandra Gonzales MRN: [id number] DOB:[birth year], 83 y.o., female Today's Date: 03/23/2023  END OF SESSION:  PT End of Session - 03/23/23 1307     Visit Number 1    Date for PT Re-Evaluation 06/15/23    Authorization Type [employer]    Authorization - Visit Number 1    Authorization - Number of Visits 10    PT Start Time 1300    PT Stop Time 1345    PT Time Calculation (min) 45 min    Activity Tolerance Patient tolerated treatment well    Behavior During Therapy WFL for tasks assessed/performed             Past Medical History:  Diagnosis Date   Allergy    Anxiety    Bilateral carotid artery stenosis    Breast cancer (HCC)    Bronchitis    Cancer (HCC)    questionable cancer to nipple   Chronic pain    Colon spasm    Diverticulitis    Dysphagia    Functional gait abnormality    GERD (gastroesophageal reflux disease)    Hyperlipidemia    Hyperlipidemia    Hypertension    IBS (irritable bowel syndrome)    Insomnia    Kidney stones    Low back pain    Lumbar radiculopathy    Myalgia    Neuropathy    Osteopenia    Personal history of radiation therapy    Sciatica    Sleep disorder    Past Surgical History:  Procedure Laterality Date   ABDOMINAL HYSTERECTOMY     BREAST EXCISIONAL BIOPSY     BREAST LUMPECTOMY Left    march 2000   BREAST SURGERY     R lumpectomy 2000   CATARACT EXTRACTION, BILATERAL     EXPLORATORY LAPAROTOMY     EXPLORATORY LAPAROTOMY     EYE SURGERY     bilat cataract removal   MOUTH SURGERY     RADIOACTIVE SEED GUIDED EXCISIONAL BREAST BIOPSY Left 09/26/2016   Procedure: LEFT RADIOACTIVE SEED GUIDED EXCISIONAL BREAST BIOPSY ERAS PATHWAY;  Surgeon: Emelia Loron, MD;  Location: [facility];  Service: General;  Laterality: Left;  LMA   Patient Active Problem List   Diagnosis Date Noted   Complicated UTI (urinary tract infection) 12/30/2020    Leukocytosis 12/30/2020   Elevated troponin 12/30/2020   Essential hypertension 12/30/2020   Allergic rhinitis 12/30/2020   Facial spasm 02/29/2016   Esophageal reflux 02/18/2016   Neuropathy 02/18/2016    PCP: Noberto Retort <D  REFERRING PROVIDER: Marguerita Beards, MD   REFERRING DIAG:  [phone] (ICD-10-CM) - Levator spasm  K62.89 (ICD-10-CM) - Rectal pain  N81.10 (ICD-10-CM) - Prolapse of anterior vaginal wall    THERAPY DIAG:  Muscle weakness (generalized) - Plan: PT plan of care cert/re-cert  Other lack of coordination - Plan: PT plan of care cert/re-cert  Rationale for Evaluation and Treatment: Rehabilitation  ONSET DATE: 2023  SUBJECTIVE:  SUBJECTIVE STATEMENT: Patients symptoms began 2 years ago. It is a little better. I am using the estrogen cream. Rectal pain when gets constipation but right now doing better.  Fluid intake: water, coke every 3-4 days,   PAIN:  Are you having pain? Yes NPRS scale: 1/10, when constipated gets to 7/10 Pain location: Anal  Pain type: aching Pain description: intermittent   Aggravating factors: when constipated Relieving factors: bowel movement    PRECAUTIONS: Other: breast cancer  RED FLAGS: None   WEIGHT BEARING RESTRICTIONS: No  FALLS:  Has patient fallen in last 6 months? No  OCCUPATION: retired  ACTIVITY LEVEL : no exercise right now  PLOF: Independent  PATIENT GOALS: reduce the rectal pain  PERTINENT HISTORY:  Breast cancer with radiation; Abdominal hysterectomy; Exploratory laparotomy; Osteopenia   BOWEL MOVEMENT: Pain with bowel movement: Yes, when constipated Type of bowel movement:Type (Bristol Stool Scale) Type 4, Frequency daily, Strain trying to not do that, and Splinting sometimes Fully empty rectum:  No Leakage: Yes: sometimes Fiber supplement/laxative Yes   URINATION: Pain with urination: No Fully empty bladder: No Stream: Strong and Weak Urgency: No Frequency: average Leakage: none  INTERCOURSE: not active   PREGNANCY: Vaginal deliveries 2 PROLAPSE: Bulge   OBJECTIVE:  Note: Objective measures were completed at Evaluation unless otherwise noted.  DIAGNOSTIC FINDINGS:  none   COGNITION: Overall cognitive status: Within functional limits for tasks assessed     SENSATION: Light touch: Appears intact   POSTURE: rounded shoulders, forward head, and decreased lumbar lordosis   LUMBARAROM/PROM: Lumbar ROM decreased by 50%   LOWER EXTREMITY ROM:  Passive ROM Right eval Left eval  Hip flexion 110 105  Hip external rotation 45 45   (Blank rows = not tested)  LOWER EXTREMITY MMT: bilateral hip strength is 4/5  PALPATION:    Abdominal: scar from pubic bone to the umbilicus with restrictions; difficulty with contracting the lower abdominal                External Perineal Exam: observed prolapse at the entrance                             Internal Pelvic Floor: tenderness in the levator ani, decreased movement of the puborectalis, tenderness in the coccygeus  Patient confirms identification and approves PT to assess internal pelvic floor and treatment Yes  PELVIC MMT:   MMT eval  Vaginal 2/5 with exception posterior 3/5.   Internal Anal Sphincter Was 2/5 , after manual 4/5  External Anal Sphincter Was 2/5 , after manual 4/5  Puborectalis Was 0/5 but after manual became 4/5  Diastasis Recti none  (Blank rows = not tested)        PROLAPSE: Anterior wall weakness that goes past the introitus when bearing down in supine  TODAY'S TREATMENT:  DATE: 03/23/23  EVAL see below   PATIENT EDUCATION:  03/23/23 Education details: Access  Code: [facility] Person educated: Patient Education method: Explanation, Demonstration, Tactile cues, Verbal cues, and Handouts Education comprehension: verbalized understanding, returned demonstration, verbal cues required, tactile cues required, and needs further education  HOME EXERCISE PROGRAM: 03/23/23 Access Code: [facility] URL: [email or web address] Date: 03/23/2023 Prepared by: Eulis Foster  Exercises - Seated Pelvic Floor Contraction  - 3 x daily - 7 x weekly - 1 sets - 10 reps  ASSESSMENT:  CLINICAL IMPRESSION: Patient is a 83 y.o. female who was seen today for physical therapy evaluation and treatment for anterior wall prolapse, rectal pain and levator ani spasm.  Patient reports her smptoms stared 2 years ago. She started to feel a bulge and have rectal pain.  Patient reports her rectal pain is average 1/10 but with constipation she is a 7/10. Sometimes patient will strain to have a bowel movement and splint. She does report stool leakage on occasion. Rectal strength was 2/10 but after manual work went to 4/5. She has tenderness located in the levator ani and coccygeus. She has an anterior wall weakness at the introitus and when bulging the pelvic floor it goes past the introitus in  supine. Vaginal strength is 3/5 posteriorly and the rest is 2/5. Patient has a lower abdominal scar that is restricted. Patient will benefit from skilled therapy to improve pelvic floor strength, education on prolapse management and reducing her pain.   OBJECTIVE IMPAIRMENTS: decreased coordination, decreased strength, increased fascial restrictions, increased muscle spasms, and pain.   ACTIVITY LIMITATIONS: bending, standing, continence, toileting, and locomotion level  PARTICIPATION LIMITATIONS: meal prep, cleaning, laundry, driving, shopping, and community activity  PERSONAL FACTORS: 3+ comorbidities: Breast cancer with radiation; Abdominal hysterectomy; Exploratory laparotomy;  Osteopenia  are also affecting patient's functional outcome.   REHAB POTENTIAL: Good  CLINICAL DECISION MAKING: Evolving/moderate complexity  EVALUATION COMPLEXITY: Moderate   GOALS: Goals reviewed with patient? Yes  SHORT TERM GOALS: Target date: 04/18/23  Patient is independent with initial HEP for pelvic floor contraction.  Baseline: Goal status: INITIAL  2.  Patient reports rectal pain decreased >/= 25% with the daily pain.  Baseline:  Goal status: INITIAL  3.  Patient educated on diaphragmatic breathing to fully expel her stool without straining.  Baseline:  Goal status: INITIAL   LONG TERM GOALS: Target date: 06/15/23  Patient independent with advanced HEP for pelvic floor and core.  Baseline:  Goal status: INITIAL  2.  Patient reports she is able to empty her rectum 75% of the time due to reduction of levator spasms.  Baseline:  Goal status: INITIAL  3.  Patient educated on how to manage prolapse with pressure management.  Baseline:  Goal status: INITIAL  4.  Patient is able to engage her lower abdominals to be able to push the stool out.  Baseline:  Goal status: INITIAL  5.  Patient reports her rectal pain decreased >/= 75% due to reduction of spasms.  Baseline:  Goal status: INITIAL  PLAN:  PT FREQUENCY: 1-2x/week  PT DURATION: 12 weeks  PLANNED INTERVENTIONS: 97110-Therapeutic exercises, 97530- Therapeutic activity, 97112- Neuromuscular re-education, 97535- Self Care, 32440- Manual therapy, G0283- Electrical stimulation (unattended), Patient/Family education, Dry Needling, Scar mobilization, and Biofeedback  PLAN FOR NEXT SESSION: manual work to the abdomen and scar; manual work to the pelvic floor vaginally to work on contraction, prolapse management   Eulis Foster, PT 03/23/23 3:00 PM

## 2023-03-23 ENCOUNTER — Other Ambulatory Visit: Payer: Self-pay

## 2023-03-23 ENCOUNTER — Encounter: Payer: HMO | Attending: Obstetrics and Gynecology | Admitting: Physical Therapy

## 2023-03-23 ENCOUNTER — Encounter: Payer: Self-pay | Admitting: Physical Therapy

## 2023-03-23 DIAGNOSIS — R278 Other lack of coordination: Secondary | ICD-10-CM | POA: Insufficient documentation

## 2023-03-23 DIAGNOSIS — M6281 Muscle weakness (generalized): Secondary | ICD-10-CM | POA: Diagnosis not present

## 2023-03-23 NOTE — Patient Instructions (Signed)
About Cystocele Overview The pelvic organs, including the bladder, are normally supported by pelvic floor muscles and ligaments.   When these muscles and ligaments are stretched, weakened or torn, the wall between the bladder and the vagina sags or herniates causing a prolapse, sometimes called a cystocele. This condition may cause discomfort and problems with emptying the bladder.  It can be present in various stages.  Some people are not aware of the changes. Others may notice changes at the vaginal opening or a feeling of the bladder dropping outside the body. Causes of a Cystocele A cystocele is usually caused by muscle straining or stretching during childbirth.  In addition, cystocele is more common after menopause, because the hormone estrogen helps keep the elastic tissues around the pelvic organs strong.  A cystocele is more likely to occur when levels of estrogen decrease. Other causes include: heavy lifting, chronic coughing, previous pelvic surgery and obesity.  Symptoms A bladder that has dropped from its normal position may cause: unwanted urine leakage (stress incontinence), frequent urination or urge to urinate, incomplete emptying of the bladder (not feeling bladder relief after emptying), pain or discomfort in the vagina, pelvis, groin, lower back or lower abdomen and frequent urinary tract infections.  Mild cases may not cause any symptoms.  Treatment Options Pelvic floor (Kegel) exercises: Strength training the muscles in your genital area  Behavioral changes: Treating and preventing constipation, taking time to empty your bladder properly, learning to lift properly and/or  avoid heavy lifting when possible, stopping smoking, avoiding weight gain and treating a chronic cough or bronchitis. A pessary: A vaginal support device is sometimes used to help pelvic support caused by muscle and ligament changes. Surgery: Aurgical repair may be necessary if symptoms cannot be managed with  exercise, behavioral changes and a pessary. Surgery is usually considered for severe cases.  About Pelvic Support Problems Pelvic Support Problems Explained Ligaments, muscles, and connective tissue normally hold your bladder, uterus, and other organs in their proper places in your pelvis. When these tissues become weak, a problem with pelvic support may result. Weak support can cause one or more of the pelvic organs to drop down into the vagina. An organ may even drop so far that is partially exposed outside the body.  Pelvic support problems are named by the change in the organ. The main types of pelvic support problems are:  Cystocele: When the bladder drops down into your vagina.  Enterocele: When your small intestine drops between your vagina and rectum.  Rectocele: When your rectum bulges into the vaginal wall.  Uterine prolapse: When your uterus drops into your vagina.  Vaginal prolapse: When the top part of the vagina begins to droop. This sometimes happens after a hysterectomy (removal of the uterus).  Causes Pelvic support problems can be caused by many conditions. They may begin after you give birth, especially if you had a large baby. During childbirth, the muscles and skin of the birth canal (vagina) are stretched and sometimes torn. They heal over time but are not always exactly the same. A long pushing stage of labor may also weaken these tissues as well as very rapid births as the tissues do not have time to stretch so they tear.  Also, after menopause, there are changes in the vaginal walls resulting from a decrease in estrogen. Estrogen helps to keep the tissues toned. Low levels of estrogen weaken the vaginal walls and may cause the bladder to shift from its normal position. As women get   older, the loss of muscle tone and the relaxation of muscles may cause the uterus or other organs to drop.  Over time, conditions like chronic coughing, chronic constipation, doing a lot of heavy  lifting, straining to pass stool, and obesity, can also weaken the pelvic support muscles.  Diagnosing Pelvic Support Problems Your health care provider will ask about your symptoms and do a pelvic examination. Your provider may also do a rectal exam during your pelvic exam. Your provider may ask you to: 1. Bear down and push (like you are having a bowel movement) so he or she can see if your bladder or other part of your body protrudes into the vagina. 2. Contract the muscles of your pelvis to check the strength of your pelvic muscles.  3. Do several types of urine, nerve and muscle tests of the pelvis and around the bladder to see what type of treatment is best for you.   Symptoms Symptoms of pelvic support problems depend on the organ involved, but may include:  urine leakage  stain or fecal loss after a bowel movement trouble having bowel movements  ache in the lower abdomen, groin, or lower back  bladder infection  a feeling of heaviness, pulling, or fullness in the pelvis, or a feeling that something is falling out of the vagina  an organ protruding from your vaginal opening  feeling the need to support the organs or perineal area to empty bladder or bowels painful sexual intercourse.  Many women feel pelvic pressure or trouble holding their urine immediately after childbirth. For some, these symptoms go away permanently, in others they return as they get older.  Treatment Options A prolapsed organ cannot repair itself. Contact your health care provider as soon as you notice symptoms of a problem. Treatment depends on what the specific problem is and how far advanced it is.  The symptoms caused by some pelvic support problems may simply be treated with changes in diet, medicine to soften the stool, weight loss, or avoiding strenuous activities. You may also do pelvic floor exercises to help strengthen your pelvic muscles.  Some cases of prolapse may require a special support device made  from plastic or rubber called a pessary that fits into the vagina to support the uterus, vagina, or bladder. A pessary can also help women who leak urine when coughing, straining, or exercising. In mild cases, a tampon or vaginal diaphragm may be used instead of a pessary.  Talk to your doctor or health care provider about these options. In serious cases, surgery may be needed to put the organs back into their proper place. The uterus may be removed because of the pressure it puts on the bladder.  Your doctor will know what surgery will be best for you. How can I prevent pelvic support problems?  You can help prevent pelvic support problems by:  maintaining a healthy lifestyle  continuing to do pelvic floor exercises after you deliver a baby  maintaining a healthy weight  avoiding a lot of heavy lifting and lifting with your legs (not from your waist)  treating constipation and avoid getting  Cheryl Gray, PT [facility] [address], [address] [place], [place] [address] W: [phone] [email or web address]  

## 2023-03-31 DIAGNOSIS — K1120 Sialoadenitis, unspecified: Secondary | ICD-10-CM | POA: Diagnosis not present

## 2023-03-31 DIAGNOSIS — M2669 Other specified disorders of temporomandibular joint: Secondary | ICD-10-CM | POA: Diagnosis not present

## 2023-04-06 ENCOUNTER — Encounter: Payer: HMO | Admitting: Physical Therapy

## 2023-04-06 ENCOUNTER — Encounter: Payer: Self-pay | Admitting: Physical Therapy

## 2023-04-06 DIAGNOSIS — R278 Other lack of coordination: Secondary | ICD-10-CM

## 2023-04-06 DIAGNOSIS — M26609 Unspecified temporomandibular joint disorder, unspecified side: Secondary | ICD-10-CM | POA: Diagnosis not present

## 2023-04-06 DIAGNOSIS — M6281 Muscle weakness (generalized): Secondary | ICD-10-CM | POA: Diagnosis not present

## 2023-04-06 DIAGNOSIS — L299 Pruritus, unspecified: Secondary | ICD-10-CM | POA: Diagnosis not present

## 2023-04-06 DIAGNOSIS — H6123 Impacted cerumen, bilateral: Secondary | ICD-10-CM | POA: Diagnosis not present

## 2023-04-06 NOTE — Patient Instructions (Signed)
The first picture shows that there is no effect on the pelvic floor with gravity eliminated. The next three show that with a wedge pillow or a few pillows from home under your pelvis the pelvic floor is inverted and may relax and allows gravity to help return prolapsed areas more inward to help relieve symptoms. Do this 15-20 mins every evening when symptoms tend to be worse. Stop if you have pain or negative symptoms.    About Pelvic Support Problems Pelvic Support Problems Explained Ligaments, muscles, and connective tissue normally hold your bladder, uterus, and other organs in their proper places in your pelvis. When these tissues become weak, a problem with pelvic support may result. Weak support can cause one or more of the pelvic organs to drop down into the vagina. An organ may even drop so far that is partially exposed outside the body.  Pelvic support problems are named by the change in the organ. The main types of pelvic support problems are:  Cystocele: When the bladder drops down into your vagina.  Enterocele: When your small intestine drops between your vagina and rectum.  Rectocele: When your rectum bulges into the vaginal wall.  Uterine prolapse: When your uterus drops into your vagina.  Vaginal prolapse: When the top part of the vagina begins to droop. This sometimes happens after a hysterectomy (removal of the uterus).  Causes Pelvic support problems can be caused by many conditions. They may begin after you give birth, especially if you had a large baby. During childbirth, the muscles and skin of the birth canal (vagina) are stretched and sometimes torn. They heal over time but are not always exactly the same. A long pushing stage of labor may also weaken these tissues as well as very rapid births as the tissues do not have time to stretch so they tear.  Also, after menopause, there are changes in the vaginal walls resulting from a decrease in estrogen. Estrogen helps to keep the  tissues toned. Low levels of estrogen weaken the vaginal walls and may cause the bladder to shift from its normal position. As women get older, the loss of muscle tone and the relaxation of muscles may cause the uterus or other organs to drop.  Over time, conditions like chronic coughing, chronic constipation, doing a lot of heavy lifting, straining to pass stool, and obesity, can also weaken the pelvic support muscles.  Diagnosing Pelvic Support Problems Your health care provider will ask about your symptoms and do a pelvic examination. Your provider may also do a rectal exam during your pelvic exam. Your provider may ask you to: 1. Bear down and push (like you are having a bowel movement) so he or she can see if your bladder or other part of your body protrudes into the vagina. 2. Contract the muscles of your pelvis to check the strength of your pelvic muscles.  3. Do several types of urine, nerve and muscle tests of the pelvis and around the bladder to see what type of treatment is best for you.   Symptoms Symptoms of pelvic support problems depend on the organ involved, but may include:  urine leakage  stain or fecal loss after a bowel movement trouble having bowel movements  ache in the lower abdomen, groin, or lower back  bladder infection  a feeling of heaviness, pulling, or fullness in the pelvis, or a feeling that something is falling out of the vagina  an organ protruding from your vaginal opening  feeling  the need to support the organs or perineal area to empty bladder or bowels painful sexual intercourse.  Many women feel pelvic pressure or trouble holding their urine immediately after childbirth. For some, these symptoms go away permanently, in others they return as they get older.  Treatment Options A prolapsed organ cannot repair itself. Contact your health care provider as soon as you notice symptoms of a problem. Treatment depends on what the specific problem is and how far  advanced it is.  The symptoms caused by some pelvic support problems may simply be treated with changes in diet, medicine to soften the stool, weight loss, or avoiding strenuous activities. You may also do pelvic floor exercises to help strengthen your pelvic muscles.  Some cases of prolapse may require a special support device made from plastic or rubber called a pessary that fits into the vagina to support the uterus, vagina, or bladder. A pessary can also help women who leak urine when coughing, straining, or exercising. In mild cases, a tampon or vaginal diaphragm may be used instead of a pessary.  Talk to your doctor or health care provider about these options. In serious cases, surgery may be needed to put the organs back into their proper place. The uterus may be removed because of the pressure it puts on the bladder.  Your doctor will know what surgery will be best for you. How can I prevent pelvic support problems?  You can help prevent pelvic support problems by:  maintaining a healthy lifestyle  continuing to do pelvic floor exercises after you deliver a baby  maintaining a healthy weight  avoiding a lot of heavy lifting and lifting with your legs (not from your waist)  treating constipation and avoid getting    Alexandra Gonzales, PT [facility] [address] [place], [place] [address] W: [phone] [email or web address]

## 2023-04-06 NOTE — Therapy (Signed)
 OUTPATIENT PHYSICAL THERAPY FEMALE PELVIC EVALUATION   Patient Name: Alexandra Gonzales MRN: [id number] DOB:01-25-1940, 83 y.o., female Today's Date: 04/06/2023  END OF SESSION:  PT End of Session - 04/06/23 1304     Visit Number 2    Date for PT Re-Evaluation 06/15/23    Authorization Type [employer]    Authorization - Visit Number 2    Authorization - Number of Visits 10    PT Start Time 1600    PT Stop Time 1645    PT Time Calculation (min) 45 min    Activity Tolerance Patient tolerated treatment well    Behavior During Therapy WFL for tasks assessed/performed             Past Medical History:  Diagnosis Date   Allergy    Anxiety    Bilateral carotid artery stenosis    Breast cancer (HCC)    Bronchitis    Cancer (HCC)    questionable cancer to nipple   Chronic pain    Colon spasm    Diverticulitis    Dysphagia    Functional gait abnormality    GERD (gastroesophageal reflux disease)    Hyperlipidemia    Hyperlipidemia    Hypertension    IBS (irritable bowel syndrome)    Insomnia    Kidney stones    Low back pain    Lumbar radiculopathy    Myalgia    Neuropathy    Osteopenia    Personal history of radiation therapy    Sciatica    Sleep disorder    Past Surgical History:  Procedure Laterality Date   ABDOMINAL HYSTERECTOMY     BREAST EXCISIONAL BIOPSY     BREAST LUMPECTOMY Left    march 2000   BREAST SURGERY     R lumpectomy 2000   CATARACT EXTRACTION, BILATERAL     EXPLORATORY LAPAROTOMY     EXPLORATORY LAPAROTOMY     EYE SURGERY     bilat cataract removal   MOUTH SURGERY     RADIOACTIVE SEED GUIDED EXCISIONAL BREAST BIOPSY Left 09/26/2016   Procedure: LEFT RADIOACTIVE SEED GUIDED EXCISIONAL BREAST BIOPSY ERAS PATHWAY;  Surgeon: Emelia Loron, MD;  Location: [facility];  Service: General;  Laterality: Left;  LMA   Patient Active Problem List   Diagnosis Date Noted   Complicated UTI (urinary tract infection) 12/30/2020    Leukocytosis 12/30/2020   Elevated troponin 12/30/2020   Essential hypertension 12/30/2020   Allergic rhinitis 12/30/2020   Facial spasm 02/29/2016   Esophageal reflux 02/18/2016   Neuropathy 02/18/2016    PCP: Noberto Retort <D  REFERRING PROVIDER: Marguerita Beards, MD   REFERRING DIAG:  [phone] (ICD-10-CM) - Levator spasm  K62.89 (ICD-10-CM) - Rectal pain  N81.10 (ICD-10-CM) - Prolapse of anterior vaginal wall    THERAPY DIAG:  Muscle weakness (generalized)  Other lack of coordination  Rationale for Evaluation and Treatment: Rehabilitation  ONSET DATE: 2023  SUBJECTIVE:  SUBJECTIVE STATEMENT: The last visit helped the rectum. My bowels move better and more. She has not had any rectal pain. No stool leakage. Bladder feels a little low at times.  Fluid intake: water, coke every 3-4 days,   PAIN:  Are you having pain? Yes NPRS scale: 1/10, when constipated gets to 7/10 04/06/23: 0/10 Pain location: Anal  Pain type: aching Pain description: intermittent   Aggravating factors: when constipated Relieving factors: bowel movement    PRECAUTIONS: Other: breast cancer  RED FLAGS: None   WEIGHT BEARING RESTRICTIONS: No  FALLS:  Has patient fallen in last 6 months? No  OCCUPATION: retired  ACTIVITY LEVEL : no exercise right now  PLOF: Independent  PATIENT GOALS: reduce the rectal pain  PERTINENT HISTORY:  Breast cancer with radiation; Abdominal hysterectomy; Exploratory laparotomy; Osteopenia   BOWEL MOVEMENT: Pain with bowel movement: Yes, when constipated Type of bowel movement:Type (Bristol Stool Scale) Type 4, Frequency daily, Strain trying to not do that, and Splinting sometimes Fully empty rectum: No Leakage: Yes: sometimes Fiber supplement/laxative Yes    URINATION: Pain with urination: No Fully empty bladder: No Stream: Strong and Weak Urgency: No Frequency: average Leakage: none  INTERCOURSE: not active   PREGNANCY: Vaginal deliveries 2 PROLAPSE: Bulge   OBJECTIVE:  Note: Objective measures were completed at Evaluation unless otherwise noted.  DIAGNOSTIC FINDINGS:  none   COGNITION: Overall cognitive status: Within functional limits for tasks assessed     SENSATION: Light touch: Appears intact   POSTURE: rounded shoulders, forward head, and decreased lumbar lordosis   LUMBARAROM/PROM: Lumbar ROM decreased by 50%   LOWER EXTREMITY ROM:  Passive ROM Right eval Left eval  Hip flexion 110 105  Hip external rotation 45 45   (Blank rows = not tested)  LOWER EXTREMITY MMT: bilateral hip strength is 4/5  PALPATION:    Abdominal: scar from pubic bone to the umbilicus with restrictions; difficulty with contracting the lower abdominal                External Perineal Exam: observed prolapse at the entrance                             Internal Pelvic Floor: tenderness in the levator ani, decreased movement of the puborectalis, tenderness in the coccygeus  Patient confirms identification and approves PT to assess internal pelvic floor and treatment Yes  PELVIC MMT:   MMT eval  Vaginal 2/5 with exception posterior 3/5.   Internal Anal Sphincter Was 2/5 , after manual 4/5  External Anal Sphincter Was 2/5 , after manual 4/5  Puborectalis Was 0/5 but after manual became 4/5  Diastasis Recti none  (Blank rows = not tested)        PROLAPSE: Anterior wall weakness that goes past the introitus when bearing down in supine  TODAY'S TREATMENT:     04/06/23 Manual: Soft tissue mobilization: Circular massage to the abdomen to promote peristalic motion and educated patient on how to perform at home.  Manual work to the diaphragm to improve mobility Scar tissue mobilization: Mobilization of abdominal scar with  going through the restrictions, lifting with a suction cup and moving in different directions Myofascial release: Tissue rolling around the lower rib cage and abdomen  Neuromuscular re-education: Down training: Diaphragmatic breathing with tactile cues to open the abdomen and verbal cues to breath into her mouth Educated patient to lay on back with feet elevated to reduce the strain  on the pelvic floor                                                                                                                             DATE: 03/23/23  EVAL see below   PATIENT EDUCATION:  04/06/23 Education details: Access Code: [facility] Person educated: Patient Education method: Explanation, Demonstration, Tactile cues, Verbal cues, and Handouts Education comprehension: verbalized understanding, returned demonstration, verbal cues required, tactile cues required, and needs further education  HOME EXERCISE PROGRAM: 04/06/23 Access Code: [facility] URL: [email or web address] Date: 04/06/2023 Prepared by: Eulis Foster  Exercises - Seated Pelvic Floor Contraction  - 3 x daily - 7 x weekly - 1 sets - 10 reps - Supine Diaphragmatic Breathing  - 2 x daily - 7 x weekly - 1 sets - 10 reps  Patient Education - Abdominal Massage for Constipation - Abdominal Massage for Constipation  ASSESSMENT:  CLINICAL IMPRESSION: Patient is a 83 y.o. female who was seen today for physical therapy  treatment for anterior wall prolapse, rectal pain and levator ani spasm.  Patient reports no rectal pain since the initial evaluation and improve bowel movements. She needs verbal and tactile cues to assist with diaphragmatic breathing.  Started to educate patient on prolapse management. Patient will benefit from skilled therapy to improve pelvic floor strength, education on prolapse management and reducing her pain.   OBJECTIVE IMPAIRMENTS: decreased coordination, decreased strength, increased fascial  restrictions, increased muscle spasms, and pain.   ACTIVITY LIMITATIONS: bending, standing, continence, toileting, and locomotion level  PARTICIPATION LIMITATIONS: meal prep, cleaning, laundry, driving, shopping, and community activity  PERSONAL FACTORS: 3+ comorbidities: Breast cancer with radiation; Abdominal hysterectomy; Exploratory laparotomy; Osteopenia  are also affecting patient's functional outcome.   REHAB POTENTIAL: Good  CLINICAL DECISION MAKING: Evolving/moderate complexity  EVALUATION COMPLEXITY: Moderate   GOALS: Goals reviewed with patient? Yes  SHORT TERM GOALS: Target date: 04/18/23  Patient is independent with initial HEP for pelvic floor contraction.  Baseline: Goal status: INITIAL  2.  Patient reports rectal pain decreased >/= 25% with the daily pain.  Baseline:  Goal status: Met 04/06/23  3.  Patient educated on diaphragmatic breathing to fully expel her stool without straining.  Baseline:  Goal status: INITIAL   LONG TERM GOALS: Target date: 06/15/23  Patient independent with advanced HEP for pelvic floor and core.  Baseline:  Goal status: INITIAL  2.  Patient reports she is able to empty her rectum 75% of the time due to reduction of levator spasms.  Baseline:  Goal status: INITIAL  3.  Patient educated on how to manage prolapse with pressure management.  Baseline:  Goal status: INITIAL  4.  Patient is able to engage her lower abdominals to be able to push the stool out.  Baseline:  Goal status: INITIAL  5.  Patient reports her rectal pain decreased >/= 75% due to reduction of spasms.  Baseline:  Goal status: INITIAL  PLAN:  PT FREQUENCY: 1-2x/week  PT DURATION: 12 weeks  PLANNED INTERVENTIONS: 97110-Therapeutic exercises, 97530- Therapeutic activity, 97112- Neuromuscular re-education, 97535- Self Care, 16109- Manual therapy, G0283- Electrical stimulation (unattended), Patient/Family education, Dry Needling, Scar mobilization, and  Biofeedback  PLAN FOR NEXT SESSION:  manual work to the pelvic floor vaginally to work on contraction, prolapse management, abdominal contraction   Eulis Foster, PT 04/06/23 2:00 PM

## 2023-04-13 ENCOUNTER — Encounter: Payer: Self-pay | Admitting: Physical Therapy

## 2023-04-13 ENCOUNTER — Encounter: Payer: HMO | Attending: Obstetrics and Gynecology | Admitting: Physical Therapy

## 2023-04-13 DIAGNOSIS — M6281 Muscle weakness (generalized): Secondary | ICD-10-CM | POA: Diagnosis not present

## 2023-04-13 DIAGNOSIS — R278 Other lack of coordination: Secondary | ICD-10-CM | POA: Insufficient documentation

## 2023-04-13 NOTE — Therapy (Signed)
 OUTPATIENT PHYSICAL THERAPY FEMALE PELVIC EVALUATION   Patient Name: Alexandra Gonzales MRN: [id number] DOB:18-May-1940, 83 y.o., female Today's Date: 04/13/2023  END OF SESSION:  PT End of Session - 04/13/23 1306     Visit Number 3    Date for PT Re-Evaluation 06/15/23    Authorization Type [employer]    Authorization - Visit Number 3    Authorization - Number of Visits 10    PT Start Time 1300    PT Stop Time 1345    PT Time Calculation (min) 45 min    Activity Tolerance Patient tolerated treatment well    Behavior During Therapy WFL for tasks assessed/performed             Past Medical History:  Diagnosis Date   Allergy    Anxiety    Bilateral carotid artery stenosis    Breast cancer (HCC)    Bronchitis    Cancer (HCC)    questionable cancer to nipple   Chronic pain    Colon spasm    Diverticulitis    Dysphagia    Functional gait abnormality    GERD (gastroesophageal reflux disease)    Hyperlipidemia    Hyperlipidemia    Hypertension    IBS (irritable bowel syndrome)    Insomnia    Kidney stones    Low back pain    Lumbar radiculopathy    Myalgia    Neuropathy    Osteopenia    Personal history of radiation therapy    Sciatica    Sleep disorder    Past Surgical History:  Procedure Laterality Date   ABDOMINAL HYSTERECTOMY     BREAST EXCISIONAL BIOPSY     BREAST LUMPECTOMY Left    march 2000   BREAST SURGERY     R lumpectomy 2000   CATARACT EXTRACTION, BILATERAL     EXPLORATORY LAPAROTOMY     EXPLORATORY LAPAROTOMY     EYE SURGERY     bilat cataract removal   MOUTH SURGERY     RADIOACTIVE SEED GUIDED EXCISIONAL BREAST BIOPSY Left 09/26/2016   Procedure: LEFT RADIOACTIVE SEED GUIDED EXCISIONAL BREAST BIOPSY ERAS PATHWAY;  Surgeon: Emelia Loron, MD;  Location: [facility];  Service: General;  Laterality: Left;  LMA   Patient Active Problem List   Diagnosis Date Noted   Complicated UTI (urinary tract infection) 12/30/2020    Leukocytosis 12/30/2020   Elevated troponin 12/30/2020   Essential hypertension 12/30/2020   Allergic rhinitis 12/30/2020   Facial spasm 02/29/2016   Esophageal reflux 02/18/2016   Neuropathy 02/18/2016    PCP: Noberto Retort <D  REFERRING PROVIDER: Marguerita Beards, MD   REFERRING DIAG:  [phone] (ICD-10-CM) - Levator spasm  K62.89 (ICD-10-CM) - Rectal pain  N81.10 (ICD-10-CM) - Prolapse of anterior vaginal wall    THERAPY DIAG:  Muscle weakness (generalized)  Other lack of coordination  Rationale for Evaluation and Treatment: Rehabilitation  ONSET DATE: 2023  SUBJECTIVE:  SUBJECTIVE STATEMENT: The rectal pain is 100% better.  The last visit helped the rectum. My bowels move better and more. She has not had any rectal pain. No stool leakage. Bladder feels a little low at times.  Fluid intake: water, coke every 3-4 days,   PAIN:  Are you having pain? Yes NPRS scale: 1/10, when constipated gets to 7/10 04/06/23: 0/10 Pain location: Anal  Pain type: aching Pain description: intermittent   Aggravating factors: when constipated Relieving factors: bowel movement    PRECAUTIONS: Other: breast cancer  RED FLAGS: None   WEIGHT BEARING RESTRICTIONS: No  FALLS:  Has patient fallen in last 6 months? No  OCCUPATION: retired  ACTIVITY LEVEL : no exercise right now  PLOF: Independent  PATIENT GOALS: reduce the rectal pain  PERTINENT HISTORY:  Breast cancer with radiation; Abdominal hysterectomy; Exploratory laparotomy; Osteopenia   BOWEL MOVEMENT: Pain with bowel movement: Yes, when constipated Type of bowel movement:Type (Bristol Stool Scale) Type 4, Frequency daily, Strain trying to not do that, and Splinting sometimes Fully empty rectum: No Leakage: Yes:  sometimes Fiber supplement/laxative Yes   URINATION: Pain with urination: No Fully empty bladder: No Stream: Strong and Weak Urgency: No Frequency: average Leakage: none  INTERCOURSE: not active   PREGNANCY: Vaginal deliveries 2 PROLAPSE: Bulge   OBJECTIVE:  Note: Objective measures were completed at Evaluation unless otherwise noted.  DIAGNOSTIC FINDINGS:  none   COGNITION: Overall cognitive status: Within functional limits for tasks assessed     SENSATION: Light touch: Appears intact   POSTURE: rounded shoulders, forward head, and decreased lumbar lordosis   LUMBARAROM/PROM: Lumbar ROM decreased by 50%   LOWER EXTREMITY ROM:  Passive ROM Right eval Left eval  Hip flexion 110 105  Hip external rotation 45 45   (Blank rows = not tested)  LOWER EXTREMITY MMT: bilateral hip strength is 4/5  PALPATION:    Abdominal: scar from pubic bone to the umbilicus with restrictions; difficulty with contracting the lower abdominal                External Perineal Exam: observed prolapse at the entrance                             Internal Pelvic Floor: tenderness in the levator ani, decreased movement of the puborectalis, tenderness in the coccygeus  Patient confirms identification and approves PT to assess internal pelvic floor and treatment Yes  PELVIC MMT:   MMT eval  Vaginal 2/5 with exception posterior 3/5.   Internal Anal Sphincter Was 2/5 , after manual 4/5  External Anal Sphincter Was 2/5 , after manual 4/5  Puborectalis Was 0/5 but after manual became 4/5  Diastasis Recti none  (Blank rows = not tested)        PROLAPSE: Anterior wall weakness that goes past the introitus when bearing down in supine  TODAY'S TREATMENT:     04/13/23 Manual: Soft tissue mobilization: Circular massage to the abdomen to promote peristalic motion and reviewed with patient on how to perform at home Manual work to the diaphragm to improve mobility Worked on improving  the lymph drainage to reduce swelling of the abdomen Scar tissue mobilization: Mobilization of abdominal scar with going through the restrictions Neuromuscular re-education: Core facilitation: Supine hip flexion isometric to engage the lower abdomen 10 x each leg Transverse abdominus contraction 10 x  Down training: Diaphragmatic breathing to expand the lower rib cage and tactile cues on  the lower rib cage    04/06/23 Manual: Soft tissue mobilization: Circular massage to the abdomen to promote peristalic motion and educated patient on how to perform at home.  Manual work to the diaphragm to improve mobility Scar tissue mobilization: Mobilization of abdominal scar with going through the restrictions, lifting with a suction cup and moving in different directions Myofascial release: Tissue rolling around the lower rib cage and abdomen  Neuromuscular re-education: Down training: Diaphragmatic breathing with tactile cues to open the abdomen and verbal cues to breath into her mouth Educated patient to lay on back with feet elevated to reduce the strain on the pelvic floor                                                                                                                             DATE: 03/23/23  EVAL see below   PATIENT EDUCATION:  04/13/23 Education details: Access Code: [facility] Person educated: Patient Education method: Explanation, Demonstration, Actor cues, Verbal cues, and Handouts Education comprehension: verbalized understanding, returned demonstration, verbal cues required, tactile cues required, and needs further education  HOME EXERCISE PROGRAM: 04/13/23 Access Code: [facility] URL: [email or web address] Date: 04/13/2023 Prepared by: Eulis Foster  Exercises - Seated Pelvic Floor Contraction  - 3 x daily - 7 x weekly - 1 sets - 10 reps - Supine Diaphragmatic Breathing  - 2 x daily - 7 x weekly - 1 sets - 10 reps - Hooklying Transversus Abdominis  Palpation  - 1 x daily - 7 x weekly - 1 sets - 10 reps - Hooklying Isometric Hip Flexion  - 1 x daily - 7 x weekly - 1 sets - 10 reps  Patient Education - Abdominal Massage for Constipation - Abdominal Massage for Constipation  ASSESSMENT:  CLINICAL IMPRESSION: Patient is a 83 y.o. female who was seen today for physical therapy  treatment for anterior wall prolapse, rectal pain and levator ani spasm.  Patient reports no rectal pain since the initial evaluation and improve bowel movements. She needs verbal and tactile cues to assist with diaphragmatic breathing still. Her abdomen reduces after the manual work. She is learning how to engage her lower abdominals but needs many cues.  Patient will benefit from skilled therapy to improve pelvic floor strength, education on prolapse management and reducing her pain.   OBJECTIVE IMPAIRMENTS: decreased coordination, decreased strength, increased fascial restrictions, increased muscle spasms, and pain.   ACTIVITY LIMITATIONS: bending, standing, continence, toileting, and locomotion level  PARTICIPATION LIMITATIONS: meal prep, cleaning, laundry, driving, shopping, and community activity  PERSONAL FACTORS: 3+ comorbidities: Breast cancer with radiation; Abdominal hysterectomy; Exploratory laparotomy; Osteopenia  are also affecting patient's functional outcome.   REHAB POTENTIAL: Good  CLINICAL DECISION MAKING: Evolving/moderate complexity  EVALUATION COMPLEXITY: Moderate   GOALS: Goals reviewed with patient? Yes  SHORT TERM GOALS: Target date: 04/18/23  Patient is independent with initial HEP for pelvic floor contraction.  Baseline: Goal status: INITIAL  2.  Patient reports rectal  pain decreased >/= 25% with the daily pain.  Baseline:  Goal status: Met 04/06/23  3.  Patient educated on diaphragmatic breathing to fully expel her stool without straining.  Baseline:  Goal status: INITIAL   LONG TERM GOALS: Target date: 06/15/23  Patient  independent with advanced HEP for pelvic floor and core.  Baseline:  Goal status: INITIAL  2.  Patient reports she is able to empty her rectum 75% of the time due to reduction of levator spasms.  Baseline:  Goal status: INITIAL  3.  Patient educated on how to manage prolapse with pressure management.  Baseline:  Goal status: INITIAL  4.  Patient is able to engage her lower abdominals to be able to push the stool out.  Baseline:  Goal status: INITIAL  5.  Patient reports her rectal pain decreased >/= 75% due to reduction of spasms.  Baseline:  Goal status: Met 04/13/23  PLAN:  PT FREQUENCY: 1-2x/week  PT DURATION: 12 weeks  PLANNED INTERVENTIONS: 97110-Therapeutic exercises, 97530- Therapeutic activity, 97112- Neuromuscular re-education, 97535- Self Care, 69629- Manual therapy, G0283- Electrical stimulation (unattended), Patient/Family education, Dry Needling, Scar mobilization, and Biofeedback  PLAN FOR NEXT SESSION:  manual work to the pelvic floor vaginally to work on contraction, prolapse management, abdominal contraction   Eulis Foster, PT 04/13/23 1:56 PM

## 2023-04-20 ENCOUNTER — Encounter: Payer: HMO | Admitting: Physical Therapy

## 2023-04-20 ENCOUNTER — Encounter: Payer: Self-pay | Admitting: Physical Therapy

## 2023-04-20 DIAGNOSIS — R278 Other lack of coordination: Secondary | ICD-10-CM

## 2023-04-20 DIAGNOSIS — M6281 Muscle weakness (generalized): Secondary | ICD-10-CM | POA: Diagnosis not present

## 2023-04-20 NOTE — Therapy (Signed)
 OUTPATIENT PHYSICAL THERAPY FEMALE PELVIC EVALUATION   Patient Name: Alexandra Gonzales MRN: [id number] DOB:Mar 24, 1940, 83 y.o., female Today's Date: 04/20/2023  END OF SESSION:  PT End of Session - 04/20/23 1304     Visit Number 4    Date for PT Re-Evaluation 06/15/23    Authorization Type [employer]    Authorization - Visit Number 4    Authorization - Number of Visits 10    PT Start Time 1300    PT Stop Time 1345    PT Time Calculation (min) 45 min    Activity Tolerance Patient tolerated treatment well    Behavior During Therapy WFL for tasks assessed/performed             Past Medical History:  Diagnosis Date   Allergy    Anxiety    Bilateral carotid artery stenosis    Breast cancer (HCC)    Bronchitis    Cancer (HCC)    questionable cancer to nipple   Chronic pain    Colon spasm    Diverticulitis    Dysphagia    Functional gait abnormality    GERD (gastroesophageal reflux disease)    Hyperlipidemia    Hyperlipidemia    Hypertension    IBS (irritable bowel syndrome)    Insomnia    Kidney stones    Low back pain    Lumbar radiculopathy    Myalgia    Neuropathy    Osteopenia    Personal history of radiation therapy    Sciatica    Sleep disorder    Past Surgical History:  Procedure Laterality Date   ABDOMINAL HYSTERECTOMY     BREAST EXCISIONAL BIOPSY     BREAST LUMPECTOMY Left    march 2000   BREAST SURGERY     R lumpectomy 2000   CATARACT EXTRACTION, BILATERAL     EXPLORATORY LAPAROTOMY     EXPLORATORY LAPAROTOMY     EYE SURGERY     bilat cataract removal   MOUTH SURGERY     RADIOACTIVE SEED GUIDED EXCISIONAL BREAST BIOPSY Left 09/26/2016   Procedure: LEFT RADIOACTIVE SEED GUIDED EXCISIONAL BREAST BIOPSY ERAS PATHWAY;  Surgeon: Emelia Loron, MD;  Location: [facility];  Service: General;  Laterality: Left;  LMA   Patient Active Problem List   Diagnosis Date Noted   Complicated UTI (urinary tract infection) 12/30/2020    Leukocytosis 12/30/2020   Elevated troponin 12/30/2020   Essential hypertension 12/30/2020   Allergic rhinitis 12/30/2020   Facial spasm 02/29/2016   Esophageal reflux 02/18/2016   Neuropathy 02/18/2016    PCP: Noberto Retort <D  REFERRING PROVIDER: Marguerita Beards, MD   REFERRING DIAG:  [phone] (ICD-10-CM) - Levator spasm  K62.89 (ICD-10-CM) - Rectal pain  N81.10 (ICD-10-CM) - Prolapse of anterior vaginal wall    THERAPY DIAG:  Muscle weakness (generalized)  Other lack of coordination  Rationale for Evaluation and Treatment: Rehabilitation  ONSET DATE: 2023  SUBJECTIVE:  SUBJECTIVE STATEMENT: I could barely walk yesterday due to back pain. I had trouble going to the bathroom yesterday. I am getting stool on my underwear and my stools are soft. My legs are swelling.   Fluid intake: water, coke every 3-4 days,   PAIN:  Are you having pain? Yes NPRS scale: 1/10, when constipated gets to 7/10 04/06/23: 0/10 Pain location: Anal  Pain type: aching Pain description: intermittent   Aggravating factors: when constipated Relieving factors: bowel movement    PRECAUTIONS: Other: breast cancer  RED FLAGS: None   WEIGHT BEARING RESTRICTIONS: No  FALLS:  Has patient fallen in last 6 months? No  OCCUPATION: retired  ACTIVITY LEVEL : no exercise right now  PLOF: Independent  PATIENT GOALS: reduce the rectal pain  PERTINENT HISTORY:  Breast cancer with radiation; Abdominal hysterectomy; Exploratory laparotomy; Osteopenia   BOWEL MOVEMENT: Pain with bowel movement: Yes, when constipated Type of bowel movement:Type (Bristol Stool Scale) Type 4, Frequency daily, Strain trying to not do that, and Splinting sometimes Fully empty rectum: No Leakage: Yes: sometimes Fiber  supplement/laxative Yes   URINATION: Pain with urination: No Fully empty bladder: No Stream: Strong and Weak Urgency: No Frequency: average Leakage: none  INTERCOURSE: not active   PREGNANCY: Vaginal deliveries 2 PROLAPSE: Bulge   OBJECTIVE:  Note: Objective measures were completed at Evaluation unless otherwise noted.  DIAGNOSTIC FINDINGS:  none   COGNITION: Overall cognitive status: Within functional limits for tasks assessed     SENSATION: Light touch: Appears intact   POSTURE: rounded shoulders, forward head, and decreased lumbar lordosis   LUMBARAROM/PROM: Lumbar ROM decreased by 50%   LOWER EXTREMITY ROM:  Passive ROM Right eval Left eval  Hip flexion 110 105  Hip external rotation 45 45   (Blank rows = not tested)  LOWER EXTREMITY MMT: bilateral hip strength is 4/5  PALPATION:    Abdominal: scar from pubic bone to the umbilicus with restrictions; difficulty with contracting the lower abdominal                External Perineal Exam: observed prolapse at the entrance                             Internal Pelvic Floor: tenderness in the levator ani, decreased movement of the puborectalis, tenderness in the coccygeus  Patient confirms identification and approves PT to assess internal pelvic floor and treatment Yes  PELVIC MMT:   MMT eval 04/20/23  Vaginal 2/5 with exception posterior 3/5.  3/5 with weak hug, 5 sec  Internal Anal Sphincter Was 2/5 , after manual 4/5   External Anal Sphincter Was 2/5 , after manual 4/5   Puborectalis Was 0/5 but after manual became 4/5   Diastasis Recti none   (Blank rows = not tested)        PROLAPSE: Anterior wall weakness that goes past the introitus when bearing down in supine  TODAY'S TREATMENT:     04/20/23 Manual: Internal pelvic floor techniques: No emotional/communication barriers or cognitive limitation. Patient is motivated to learn. Patient understands and agrees with treatment goals and plan. PT  explains patient will be examined in standing, sitting, and lying down to see how their muscles and joints work. When they are ready, they will be asked to remove their underwear so PT can examine their perineum. The patient is also given the option of providing their own chaperone as one is not provided in our facility.  The patient also has the right and is explained the right to defer or refuse any part of the evaluation or treatment including the internal exam. With the patient's consent, PT will use one gloved finger to gently assess the muscles of the pelvic floor, seeing how well it contracts and relaxes and if there is muscle symmetry. After, the patient will get dressed and PT and patient will discuss exam findings and plan of care. PT and patient discuss plan of care, schedule, attendance policy and HEP activities.  Going through the vaginal canal working on the levator ani and obturator internist to elongate the muscles Neuromuscular re-education: Pelvic floor contraction training: Sitting marching with red band around her knees with core and pelvic floor contraction 2 x 10  Sitting hip abduction with red band around her knees with core and pelvic floor contraction 2 x 10 Sitting squeeze ball with pelvic floor contraction and tactile cues to lower abdomen to not put pressure on the pelvic floor 15 x Therapist finger in the vaginal canal working on pelvic floor contraction with tactile cues    04/13/23 Manual: Soft tissue mobilization: Circular massage to the abdomen to promote peristalic motion and reviewed with patient on how to perform at home Manual work to the diaphragm to improve mobility Worked on improving the lymph drainage to reduce swelling of the abdomen Scar tissue mobilization: Mobilization of abdominal scar with going through the restrictions Neuromuscular re-education: Core facilitation: Supine hip flexion isometric to engage the lower abdomen 10 x each leg Transverse  abdominus contraction 10 x  Down training: Diaphragmatic breathing to expand the lower rib cage and tactile cues on the lower rib cage    04/06/23 Manual: Soft tissue mobilization: Circular massage to the abdomen to promote peristalic motion and educated patient on how to perform at home.  Manual work to the diaphragm to improve mobility Scar tissue mobilization: Mobilization of abdominal scar with going through the restrictions, lifting with a suction cup and moving in different directions Myofascial release: Tissue rolling around the lower rib cage and abdomen  Neuromuscular re-education: Down training: Diaphragmatic breathing with tactile cues to open the abdomen and verbal cues to breath into her mouth Educated patient to lay on back with feet elevated to reduce the strain on the pelvic floor                                                                                                                             DATE: 03/23/23  EVAL see below   PATIENT EDUCATION:  04/20/23 Education details: Access Code: [facility] Person educated: Patient Education method: Explanation, Demonstration, Actor cues, Verbal cues, and Handouts Education comprehension: verbalized understanding, returned demonstration, verbal cues required, tactile cues required, and needs further education  HOME EXERCISE PROGRAM: 04/20/23 Access Code: [facility] URL: [email or web address] Date: 04/20/2023 Prepared by: Eulis Foster  Exercises - Seated Pelvic Floor Contraction  - 3 x daily - 7 x weekly - 1 sets -  10 reps - Supine Diaphragmatic Breathing  - 2 x daily - 7 x weekly - 1 sets - 10 reps - Hooklying Transversus Abdominis Palpation  - 1 x daily - 7 x weekly - 1 sets - 10 reps - Hooklying Isometric Hip Flexion  - 1 x daily - 7 x weekly - 1 sets - 10 reps - Seated March with Resistance  - 1 x daily - 7 x weekly - 2 sets - 10 reps - Seated Hip Abduction with Resistance  - 1 x daily - 7 x weekly  - 32 sets - 10 reps - Seated Hip Adduction Squeeze with Ball  - 1 x daily - 7 x weekly - 1 sets - 10 reps   ASSESSMENT:  CLINICAL IMPRESSION: Patient is a 83 y.o. female who was seen today for physical therapy  treatment for anterior wall prolapse, rectal pain and levator ani spasm.  Patient reports no rectal pain since the initial evaluation and improve bowel movements. She  reports difficulty with urinating and no urine coming out. Pelvic floor strength increased to 3/5 with weak hug of therapist finger. Patient was able to urinate better after the manual work. She needs tactile cues to do the new exercises correctly to engage the abdominal and pelvic floor.   Patient will benefit from skilled therapy to improve pelvic floor strength, education on prolapse management and reducing her pain.   OBJECTIVE IMPAIRMENTS: decreased coordination, decreased strength, increased fascial restrictions, increased muscle spasms, and pain.   ACTIVITY LIMITATIONS: bending, standing, continence, toileting, and locomotion level  PARTICIPATION LIMITATIONS: meal prep, cleaning, laundry, driving, shopping, and community activity  PERSONAL FACTORS: 3+ comorbidities: Breast cancer with radiation; Abdominal hysterectomy; Exploratory laparotomy; Osteopenia  are also affecting patient's functional outcome.   REHAB POTENTIAL: Good  CLINICAL DECISION MAKING: Evolving/moderate complexity  EVALUATION COMPLEXITY: Moderate   GOALS: Goals reviewed with patient? Yes  SHORT TERM GOALS: Target date: 04/18/23  Patient is independent with initial HEP for pelvic floor contraction.  Baseline: Goal status: INITIAL  2.  Patient reports rectal pain decreased >/= 25% with the daily pain.  Baseline:  Goal status: Met 04/06/23  3.  Patient educated on diaphragmatic breathing to fully expel her stool without straining.  Baseline:  Goal status: INITIAL   LONG TERM GOALS: Target date: 06/15/23  Patient independent with  advanced HEP for pelvic floor and core.  Baseline:  Goal status: INITIAL  2.  Patient reports she is able to empty her rectum 75% of the time due to reduction of levator spasms.  Baseline:  Goal status: INITIAL  3.  Patient educated on how to manage prolapse with pressure management.  Baseline:  Goal status: INITIAL  4.  Patient is able to engage her lower abdominals to be able to push the stool out.  Baseline:  Goal status: INITIAL  5.  Patient reports her rectal pain decreased >/= 75% due to reduction of spasms.  Baseline:  Goal status: Met 04/13/23  PLAN:  PT FREQUENCY: 1-2x/week  PT DURATION: 12 weeks  PLANNED INTERVENTIONS: 97110-Therapeutic exercises, 97530- Therapeutic activity, 97112- Neuromuscular re-education, 97535- Self Care, 95621- Manual therapy, G0283- Electrical stimulation (unattended), Patient/Family education, Dry Needling, Scar mobilization, and Biofeedback  PLAN FOR NEXT SESSION:  manual work to the pelvic floor vaginally to work on contraction, prolapse management, abdominal contraction, review HEP   Eulis Foster, PT 04/20/23 1:57 PM

## 2023-04-27 ENCOUNTER — Encounter: Admitting: Physical Therapy

## 2023-05-05 DIAGNOSIS — L718 Other rosacea: Secondary | ICD-10-CM | POA: Diagnosis not present

## 2023-05-05 DIAGNOSIS — S1086XA Insect bite of other specified part of neck, initial encounter: Secondary | ICD-10-CM | POA: Diagnosis not present

## 2023-05-05 DIAGNOSIS — L2389 Allergic contact dermatitis due to other agents: Secondary | ICD-10-CM | POA: Diagnosis not present

## 2023-05-05 DIAGNOSIS — L239 Allergic contact dermatitis, unspecified cause: Secondary | ICD-10-CM | POA: Diagnosis not present

## 2023-05-11 ENCOUNTER — Encounter: Attending: Obstetrics and Gynecology | Admitting: Physical Therapy

## 2023-05-11 ENCOUNTER — Encounter: Payer: Self-pay | Admitting: Physical Therapy

## 2023-05-11 DIAGNOSIS — M6281 Muscle weakness (generalized): Secondary | ICD-10-CM | POA: Diagnosis not present

## 2023-05-11 DIAGNOSIS — R278 Other lack of coordination: Secondary | ICD-10-CM | POA: Insufficient documentation

## 2023-05-11 NOTE — Patient Instructions (Addendum)
 Moisturizers They are used in the vagina to hydrate the mucous membrane that make up the vaginal canal. Designed to keep a more normal acid balance (ph) Ingredients to avoid is glycerin and fragrance, can increase chance of infection    Creams to use externally on the Vulva area Vulva Balm/ V-magic cream by medicine mama- amazon Julva-amazon Vital "V Wild Yam salve ( help moisturize and help with thinning vulvar area, does have Beeswax [employer] Pro-Meno Wild Yam Cream- [employer] Desert Harvest Gele Cleo by Derenda Flax labial moisturizer (Amazon),  Good Clean Love Enchanted Rose by intimate rose  Things to avoid in the vaginal area Do not use things to irritate the vulvar area No lotions just specialized creams for the vulva area- Neogyn, V-magic,  No soaps; can use Aveeno or Calendula cleanser, unscented Dove if needed. Must be gentle No deodorants No douches Good to sleep without underwear to let the vaginal area to air out No scrubbing: spread the lips to let warm water rinse over labias and pat dry   Marsha Skeen, PT [facility] [address] [place], [place] [address] W: [phone] [email or web address]   Urge Incontinence  Ideal urination frequency is every 2-4 wakeful hours, which equates to 5-8 times within a 24-hour period.   Urge incontinence is leakage that occurs when the bladder muscle contracts, creating a sudden need to go before getting to the bathroom.   Going too often when your bladder isn't actually full can disrupt the body's automatic signals to store and hold urine longer, which will increase urgency/frequency.  In this case, the bladder "is running the show" and strategies can be learned to retrain this pattern.   One should be able to control the first urge to urinate, at around .  The bladder can hold up to a "grande latte," or . To help you gain control, practice the Urge Drill below when urgency  strikes.  This drill will help retrain your bladder signals and allow you to store and hold urine longer.  The overall goal is to stretch out your time between voids to reach a more manageable voiding schedule.    Practice your "quick flicks" often throughout the day (each waking hour) even when you don't need feel the urge to go.  This will help strengthen your pelvic floor muscles, making them more effective in controlling leakage.  Urge Drill  When you feel an urge to go, follow these steps to regain control: Stop what you are doing and be still Take one deep breath, directing your air into your abdomen Think an affirming thought, such as "I've got this." Do 5 quick flicks of your pelvic floor Walk with control to the bathroom to void, or delay voiding   Double Voiding can be a very useful technique to help overcome incomplete emptying of your bladder.  Incomplete emptying of urine can result in leakage after using the bathroom and increase the risk of urinary tract infection.   Initial Void: When you first sit down to urinate, ensure optimal positioning for bladder emptying by following these guidelines for toileting posture: Sit on the toilet seat - don't hover over the seat Support your trunk by placing your hands on your knees or thighs Spread your knees and hips wide Position your feet flat on the floor or elevate feet on phone books, foot stool (Squatty Potty), or wrapped toilet paper rolls (if having knees above hips helps you empty) Lean forward from your hips Maintain the  normal inward curve in your lower back   Repeated Void: After your initial void is complete, follow these movement patterns and attempt going to the bathroom again. Stand up Rotate your hips as if doing hula hoop in one direction Rotate using the same action in the other direction Rock your hips and pelvis back and forwards ("pelvic tilts") Rock your hips and pelvis side to side ("tail wag") Sit back  down and repeat your voiding technique This technique can be repeated as many times as you choose to help you empty your bladder more effectively. void

## 2023-05-11 NOTE — Therapy (Signed)
 OUTPATIENT PHYSICAL THERAPY FEMALE PELVIC EVALUATION   Patient Name: Alexandra Gonzales MRN: [id number] DOB:07-10-1940, 83 y.o., female Today's Date: 05/11/2023  END OF SESSION:  PT End of Session - 05/11/23 1030     Visit Number 5    Date for PT Re-Evaluation 06/15/23    Authorization Type [employer]    Authorization - Visit Number 5    Authorization - Number of Visits 10    PT Start Time 1030    PT Stop Time 1115    PT Time Calculation (min) 45 min    Activity Tolerance Patient tolerated treatment well    Behavior During Therapy WFL for tasks assessed/performed             Past Medical History:  Diagnosis Date   Allergy     Anxiety    Bilateral carotid artery stenosis    Breast cancer (HCC)    Bronchitis    Cancer (HCC)    questionable cancer to nipple   Chronic pain    Colon spasm    Diverticulitis    Dysphagia    Functional gait abnormality    GERD (gastroesophageal reflux disease)    Hyperlipidemia    Hyperlipidemia    Hypertension    IBS (irritable bowel syndrome)    Insomnia    Kidney stones    Low back pain    Lumbar radiculopathy    Myalgia    Neuropathy    Osteopenia    Personal history of radiation therapy    Sciatica    Sleep disorder    Past Surgical History:  Procedure Laterality Date   ABDOMINAL HYSTERECTOMY     BREAST EXCISIONAL BIOPSY     BREAST LUMPECTOMY Left    march 2000   BREAST SURGERY     R lumpectomy 2000   CATARACT EXTRACTION, BILATERAL     EXPLORATORY LAPAROTOMY     EXPLORATORY LAPAROTOMY     EYE SURGERY     bilat cataract removal   MOUTH SURGERY     RADIOACTIVE SEED GUIDED EXCISIONAL BREAST BIOPSY Left 09/26/2016   Procedure: LEFT RADIOACTIVE SEED GUIDED EXCISIONAL BREAST BIOPSY ERAS PATHWAY;  Surgeon: Enid Harry, MD;  Location: [facility];  Service: General;  Laterality: Left;  LMA   Patient Active Problem List   Diagnosis Date Noted   Complicated UTI (urinary tract infection) 12/30/2020    Leukocytosis 12/30/2020   Elevated troponin 12/30/2020   Essential hypertension 12/30/2020   Allergic rhinitis 12/30/2020   Facial spasm 02/29/2016   Esophageal reflux 02/18/2016   Neuropathy 02/18/2016    PCP: Roselind Congo <D  REFERRING PROVIDER: Arma Lamp, MD   REFERRING DIAG:  [phone] (ICD-10-CM) - Levator spasm  K62.89 (ICD-10-CM) - Rectal pain  N81.10 (ICD-10-CM) - Prolapse of anterior vaginal wall    THERAPY DIAG:  Muscle weakness (generalized)  Other lack of coordination  Rationale for Evaluation and Treatment: Rehabilitation  ONSET DATE: 2023  SUBJECTIVE:  SUBJECTIVE STATEMENT: I got support hose for the swelling in my leg. I am on a steroid for my face breaking out and swelling. Antibiotic and not sure why I am taking it. . I have issues with my sciatica. The rectal pain is better.    Fluid intake: water, coke every 3-4 days,   PAIN:  Are you having pain? Yes NPRS scale: 1/10, when constipated gets to 7/10 04/06/23: 0/10 05/11/23:  pain level 0/10 Pain location: Anal  Pain type: aching Pain description: intermittent   Aggravating factors: when constipated Relieving factors: bowel movement    PRECAUTIONS: Other: breast cancer  RED FLAGS: None   WEIGHT BEARING RESTRICTIONS: No  FALLS:  Has patient fallen in last 6 months? No  OCCUPATION: retired  ACTIVITY LEVEL : no exercise right now  PLOF: Independent  PATIENT GOALS: reduce the rectal pain  PERTINENT HISTORY:  Breast cancer with radiation; Abdominal hysterectomy; Exploratory laparotomy; Osteopenia   BOWEL MOVEMENT: Pain with bowel movement: Yes, when constipated Type of bowel movement:Type (Bristol Stool Scale) Type 4, Frequency daily, Strain trying to not do that, and Splinting  sometimes Fully empty rectum: No Leakage: Yes: sometimes Fiber supplement/laxative Yes   URINATION: Pain with urination: No Fully empty bladder: No Stream: Strong and Weak Urgency: No Frequency: average Leakage: none  INTERCOURSE: not active   PREGNANCY: Vaginal deliveries 2 PROLAPSE: Bulge   OBJECTIVE:  Note: Objective measures were completed at Evaluation unless otherwise noted.  DIAGNOSTIC FINDINGS:  none   COGNITION: Overall cognitive status: Within functional limits for tasks assessed     SENSATION: Light touch: Appears intact   POSTURE: rounded shoulders, forward head, and decreased lumbar lordosis   LUMBARAROM/PROM: Lumbar ROM decreased by 50%   LOWER EXTREMITY ROM:  Passive ROM Right eval Left eval  Hip flexion 110 105  Hip external rotation 45 45   (Blank rows = not tested)  LOWER EXTREMITY MMT: bilateral hip strength is 4/5  PALPATION:    Abdominal: scar from pubic bone to the umbilicus with restrictions; difficulty with contracting the lower abdominal                External Perineal Exam: observed prolapse at the entrance                             Internal Pelvic Floor: tenderness in the levator ani, decreased movement of the puborectalis, tenderness in the coccygeus  Patient confirms identification and approves PT to assess internal pelvic floor and treatment Yes  PELVIC MMT:   MMT eval 04/20/23  Vaginal 2/5 with exception posterior 3/5.  3/5 with weak hug, 5 sec  Internal Anal Sphincter Was 2/5 , after manual 4/5   External Anal Sphincter Was 2/5 , after manual 4/5   Puborectalis Was 0/5 but after manual became 4/5   Diastasis Recti none   (Blank rows = not tested)        PROLAPSE: Anterior wall weakness that goes past the introitus when bearing down in supine  TODAY'S TREATMENT:     05/11/23 Neuromuscular re-education: Core facilitation: Supine hip flexion isometric to engage the lower abdomen 10 x each leg Transverse  abdominus contraction 10 x  Form correction: Pelvic floor contraction training: Sitting pelvic floor contraction feeling it lift off the chair Sitting squeeze ball with pelvic floor contraction and tactile cues to lower abdomen to not put pressure on the pelvic floor 15 x Sitting hip abduction with red  band around her knees with core and pelvic floor contraction 2 x 10 Sitting marching with red band around her knees with core and pelvic floor contraction 2 x 10  Down training: Educated patient on urge to void to delay the need to urinate, using diaphragmatic breathing, pelvic floor contraction and heel strike Educated patient on double voiding to empty her bladder fully since she sometimes does not urinate at all Self-care: Educated patient on vaginal moisturizers, how to apply to the vulva area and rectum, reason to use vaginal moisturizers and how to take care of the vulvar area     04/20/23 Manual: Internal pelvic floor techniques: No emotional/communication barriers or cognitive limitation. Patient is motivated to learn. Patient understands and agrees with treatment goals and plan. PT explains patient will be examined in standing, sitting, and lying down to see how their muscles and joints work. When they are ready, they will be asked to remove their underwear so PT can examine their perineum. The patient is also given the option of providing their own chaperone as one is not provided in our facility. The patient also has the right and is explained the right to defer or refuse any part of the evaluation or treatment including the internal exam. With the patient's consent, PT will use one gloved finger to gently assess the muscles of the pelvic floor, seeing how well it contracts and relaxes and if there is muscle symmetry. After, the patient will get dressed and PT and patient will discuss exam findings and plan of care. PT and patient discuss plan of care, schedule, attendance policy and HEP  activities.  Going through the vaginal canal working on the levator ani and obturator internist to elongate the muscles Neuromuscular re-education: Pelvic floor contraction training: Sitting marching with red band around her knees with core and pelvic floor contraction 2 x 10  Sitting hip abduction with red band around her knees with core and pelvic floor contraction 2 x 10 Sitting squeeze ball with pelvic floor contraction and tactile cues to lower abdomen to not put pressure on the pelvic floor 15 x Therapist finger in the vaginal canal working on pelvic floor contraction with tactile cues    04/13/23 Manual: Soft tissue mobilization: Circular massage to the abdomen to promote peristalic motion and reviewed with patient on how to perform at home Manual work to the diaphragm to improve mobility Worked on improving the lymph drainage to reduce swelling of the abdomen Scar tissue mobilization: Mobilization of abdominal scar with going through the restrictions Neuromuscular re-education: Core facilitation: Supine hip flexion isometric to engage the lower abdomen 10 x each leg Transverse abdominus contraction 10 x  Down training: Diaphragmatic breathing to expand the lower rib cage and tactile cues on the lower rib cage    PATIENT EDUCATION:  04/20/23 Education details: Access Code: [facility] Person educated: Patient Education method: Programmer, multimedia, Demonstration, Actor cues, Verbal cues, and Handouts Education comprehension: verbalized understanding, returned demonstration, verbal cues required, tactile cues required, and needs further education  HOME EXERCISE PROGRAM: 04/20/23 Access Code: [facility] URL: [email or web address] Date: 04/20/2023 Prepared by: Marsha Skeen  Exercises - Seated Pelvic Floor Contraction  - 3 x daily - 7 x weekly - 1 sets - 10 reps - Supine Diaphragmatic Breathing  - 2 x daily - 7 x weekly - 1 sets - 10 reps - Hooklying Transversus Abdominis  Palpation  - 1 x daily - 7 x weekly - 1 sets - 10 reps - Hooklying Isometric Hip Flexion  -  1 x daily - 7 x weekly - 1 sets - 10 reps - Seated March with Resistance  - 1 x daily - 7 x weekly - 2 sets - 10 reps - Seated Hip Abduction with Resistance  - 1 x daily - 7 x weekly - 32 sets - 10 reps - Seated Hip Adduction Squeeze with Ball  - 1 x daily - 7 x weekly - 1 sets - 10 reps   ASSESSMENT:  CLINICAL IMPRESSION: Patient is a 83 y.o. female who was seen today for physical therapy  treatment for anterior wall prolapse, rectal pain and levator ani spasm.  Patient reports no rectal pain since the initial evaluation and improve bowel movements. She  reports difficulty with urinating and no urine coming out. She has learned about vaginal moisturizers to reduce the burning and dryness of the vaginal area and rectum. She has learned how to deter the urge to void. She has learned how to double void to fully empty her bladder. Patient is not straining or having difficulty with her bowel movements.    Patient will benefit from skilled therapy to improve pelvic floor strength, education on prolapse management and reducing her pain.   OBJECTIVE IMPAIRMENTS: decreased coordination, decreased strength, increased fascial restrictions, increased muscle spasms, and pain.   ACTIVITY LIMITATIONS: bending, standing, continence, toileting, and locomotion level  PARTICIPATION LIMITATIONS: meal prep, cleaning, laundry, driving, shopping, and community activity  PERSONAL FACTORS: 3+ comorbidities: Breast cancer with radiation; Abdominal hysterectomy; Exploratory laparotomy; Osteopenia  are also affecting patient's functional outcome.   REHAB POTENTIAL: Good  CLINICAL DECISION MAKING: Evolving/moderate complexity  EVALUATION COMPLEXITY: Moderate   GOALS: Goals reviewed with patient? Yes  SHORT TERM GOALS: Target date: 04/18/23  Patient is independent with initial HEP for pelvic floor contraction.   Baseline: Goal status: Met 05/11/23  2.  Patient reports rectal pain decreased >/= 25% with the daily pain.  Baseline:  Goal status: Met 04/06/23  3.  Patient educated on diaphragmatic breathing to fully expel her stool without straining.  Baseline:  Goal status: Met 05/11/23   LONG TERM GOALS: Target date: 06/15/23  Patient independent with advanced HEP for pelvic floor and core.  Baseline:  Goal status: INITIAL  2.  Patient reports she is able to empty her rectum 75% of the time due to reduction of levator spasms.  Baseline:  Goal status: Met 05/11/23  3.  Patient educated on how to manage prolapse with pressure management.  Baseline:  Goal status: INITIAL  4.  Patient is able to engage her lower abdominals to be able to push the stool out.  Baseline:  Goal status: INITIAL  5.  Patient reports her rectal pain decreased >/= 75% due to reduction of spasms.  Baseline:  Goal status: Met 04/13/23  PLAN:  PT FREQUENCY: 1-2x/week  PT DURATION: 12 weeks  PLANNED INTERVENTIONS: 97110-Therapeutic exercises, 97530- Therapeutic activity, 97112- Neuromuscular re-education, 97535- Self Care, 96045- Manual therapy, [id number]- Electrical stimulation (unattended), Patient/Family education, Dry Needling, Scar mobilization, and Biofeedback  PLAN FOR NEXT SESSION:  manual work to the pelvic floor vaginally to work on contraction, prolapse management, see if the moisturizers are helping Marsha Skeen, PT 05/11/23 11:23 AM

## 2023-05-12 DIAGNOSIS — R252 Cramp and spasm: Secondary | ICD-10-CM | POA: Diagnosis not present

## 2023-05-16 ENCOUNTER — Encounter: Admitting: Physical Therapy

## 2023-05-16 DIAGNOSIS — M5416 Radiculopathy, lumbar region: Secondary | ICD-10-CM | POA: Diagnosis not present

## 2023-05-16 DIAGNOSIS — K219 Gastro-esophageal reflux disease without esophagitis: Secondary | ICD-10-CM | POA: Diagnosis not present

## 2023-05-16 DIAGNOSIS — G8929 Other chronic pain: Secondary | ICD-10-CM | POA: Diagnosis not present

## 2023-05-16 DIAGNOSIS — N814 Uterovaginal prolapse, unspecified: Secondary | ICD-10-CM | POA: Diagnosis not present

## 2023-05-18 ENCOUNTER — Ambulatory Visit: Payer: HMO | Admitting: Obstetrics and Gynecology

## 2023-05-18 DIAGNOSIS — N3281 Overactive bladder: Secondary | ICD-10-CM | POA: Diagnosis not present

## 2023-05-18 DIAGNOSIS — E7800 Pure hypercholesterolemia, unspecified: Secondary | ICD-10-CM | POA: Diagnosis not present

## 2023-05-18 DIAGNOSIS — I10 Essential (primary) hypertension: Secondary | ICD-10-CM | POA: Diagnosis not present

## 2023-05-18 DIAGNOSIS — R600 Localized edema: Secondary | ICD-10-CM | POA: Diagnosis not present

## 2023-05-18 DIAGNOSIS — K582 Mixed irritable bowel syndrome: Secondary | ICD-10-CM | POA: Diagnosis not present

## 2023-05-18 DIAGNOSIS — M545 Low back pain, unspecified: Secondary | ICD-10-CM | POA: Diagnosis not present

## 2023-05-18 DIAGNOSIS — Z0000 Encounter for general adult medical examination without abnormal findings: Secondary | ICD-10-CM | POA: Diagnosis not present

## 2023-05-18 DIAGNOSIS — R7303 Prediabetes: Secondary | ICD-10-CM | POA: Diagnosis not present

## 2023-05-22 DIAGNOSIS — E7800 Pure hypercholesterolemia, unspecified: Secondary | ICD-10-CM | POA: Diagnosis not present

## 2023-05-22 DIAGNOSIS — I10 Essential (primary) hypertension: Secondary | ICD-10-CM | POA: Diagnosis not present

## 2023-05-22 DIAGNOSIS — R7303 Prediabetes: Secondary | ICD-10-CM | POA: Diagnosis not present

## 2023-05-23 ENCOUNTER — Other Ambulatory Visit: Payer: HMO

## 2023-05-23 ENCOUNTER — Other Ambulatory Visit: Payer: Self-pay | Admitting: Obstetrics and Gynecology

## 2023-05-23 DIAGNOSIS — Z1231 Encounter for screening mammogram for malignant neoplasm of breast: Secondary | ICD-10-CM

## 2023-05-24 ENCOUNTER — Encounter: Payer: Self-pay | Admitting: Obstetrics and Gynecology

## 2023-05-24 ENCOUNTER — Ambulatory Visit: Payer: HMO | Admitting: Obstetrics and Gynecology

## 2023-05-24 VITALS — BP 146/64 | HR 58

## 2023-05-24 DIAGNOSIS — R3914 Feeling of incomplete bladder emptying: Secondary | ICD-10-CM | POA: Diagnosis not present

## 2023-05-24 DIAGNOSIS — N3941 Urge incontinence: Secondary | ICD-10-CM

## 2023-05-24 DIAGNOSIS — R339 Retention of urine, unspecified: Secondary | ICD-10-CM

## 2023-05-24 DIAGNOSIS — N812 Incomplete uterovaginal prolapse: Secondary | ICD-10-CM | POA: Diagnosis not present

## 2023-05-24 DIAGNOSIS — N8110 Cystocele, unspecified: Secondary | ICD-10-CM

## 2023-05-24 NOTE — Patient Instructions (Addendum)

## 2023-05-24 NOTE — Progress Notes (Addendum)
 [facility]   Subjective:     Chief Complaint:  Chief Complaint  Patient presents with   Follow-up    Alexandra Gonzales is a 83 y.o. female is here for 4 month follow up after starting PT.   History of Present Illness: Alexandra Gonzales is a 83 y.o. female with stage II pelvic organ prolapse.   She had some injections in her hip and having a lot of pain on her let side. Has been doing pelvic physical therapy which has been helping with her symptoms. Sometimes she is having trouble emptying her bladder and will go frequently. She occasionally splints if needed. Her urine also sprays. Most of the time she is able to make it to the bathroom. Occasionally, has a little leakage on the way to the bathroom. PT has helped a lot with constipation.   Her PCP gave her a "fluid pill" furosemide- she states its to empty her bladder but also for swollen legs.    Past Medical History: Patient  has a past medical history of Allergy , Anxiety, Bilateral carotid artery stenosis, Breast cancer (HCC), Bronchitis, Cancer (HCC), Chronic pain, Colon spasm, Diverticulitis, Dysphagia, Functional gait abnormality, GERD (gastroesophageal reflux disease), Hyperlipidemia, Hyperlipidemia, Hypertension, IBS (irritable bowel syndrome), Insomnia, Kidney stones, Low back pain, Lumbar radiculopathy, Myalgia, Neuropathy, Osteopenia, Personal history of radiation therapy, Sciatica, and Sleep disorder.   Past Surgical History: She  has a past surgical history that includes Abdominal hysterectomy; Exploratory laparotomy; Breast surgery; Eye surgery; Mouth surgery; Breast lumpectomy (Left); Radioactive seed guided excisional breast biopsy (Left, 09/26/2016); Breast excisional biopsy; Exploratory laparotomy; and Cataract extraction, bilateral.   Medications: She has a current medication list which includes the following prescription(s): acetaminophen , amlodipine , dicyclomine, estradiol, hydrocortisone , levofloxacin, losartan ,  melatonin, polyvinyl alcohol-povidone, pramoxine-hydrocortisone , and probiotic-10.   Allergies: Patient is allergic to morphine  and codeine, codeine, dexamethasone , erythromycin, hydrocodone, lyrica [pregabalin], prevacid [lansoprazole], clarithromycin, famotidine , latex, nitrofuran derivatives, polytrim [polymyxin b-trimethoprim], prednisone, ranitidine, sulfa antibiotics, zantac [ranitidine hcl], and penicillins.   Social History: Patient  reports that she has never smoked. She has never used smokeless tobacco. She reports that she does not drink alcohol and does not use drugs.      Objective:    Physical Exam: BP (!) 146/64   Pulse (!) 58   LMP  (LMP Unknown)  Gen: No apparent distress, A&O x 3. Detailed Urogynecologic Evaluation:  deferred   Assessment/Plan:    Assessment: Ms. Lenser is a 83 y.o. with stage II pelvic organ prolapse and sensation of incomplete emptying  Plan: - We discussed trying another pessary vs surgery. She may be interested in surgery at a later date but is seeking treatment for her hip. She is continuing with pelvic PT.  - Will plan for urodynamic testing to better evaluate bladder emptying and function.    Arma Lamp, MD

## 2023-05-29 DIAGNOSIS — M533 Sacrococcygeal disorders, not elsewhere classified: Secondary | ICD-10-CM | POA: Diagnosis not present

## 2023-05-29 DIAGNOSIS — M47816 Spondylosis without myelopathy or radiculopathy, lumbar region: Secondary | ICD-10-CM | POA: Diagnosis not present

## 2023-06-01 DIAGNOSIS — H02834 Dermatochalasis of left upper eyelid: Secondary | ICD-10-CM | POA: Diagnosis not present

## 2023-06-01 DIAGNOSIS — H4322 Crystalline deposits in vitreous body, left eye: Secondary | ICD-10-CM | POA: Diagnosis not present

## 2023-06-01 DIAGNOSIS — D3132 Benign neoplasm of left choroid: Secondary | ICD-10-CM | POA: Diagnosis not present

## 2023-06-01 DIAGNOSIS — H40013 Open angle with borderline findings, low risk, bilateral: Secondary | ICD-10-CM | POA: Diagnosis not present

## 2023-06-01 DIAGNOSIS — Z961 Presence of intraocular lens: Secondary | ICD-10-CM | POA: Diagnosis not present

## 2023-06-01 DIAGNOSIS — H0102B Squamous blepharitis left eye, upper and lower eyelids: Secondary | ICD-10-CM | POA: Diagnosis not present

## 2023-06-01 DIAGNOSIS — H02831 Dermatochalasis of right upper eyelid: Secondary | ICD-10-CM | POA: Diagnosis not present

## 2023-06-01 DIAGNOSIS — H04123 Dry eye syndrome of bilateral lacrimal glands: Secondary | ICD-10-CM | POA: Diagnosis not present

## 2023-06-01 DIAGNOSIS — H0102A Squamous blepharitis right eye, upper and lower eyelids: Secondary | ICD-10-CM | POA: Diagnosis not present

## 2023-06-02 DIAGNOSIS — L718 Other rosacea: Secondary | ICD-10-CM | POA: Diagnosis not present

## 2023-06-02 DIAGNOSIS — L309 Dermatitis, unspecified: Secondary | ICD-10-CM | POA: Diagnosis not present

## 2023-06-08 ENCOUNTER — Encounter: Payer: Self-pay | Admitting: Physical Therapy

## 2023-06-08 ENCOUNTER — Encounter: Admitting: Physical Therapy

## 2023-06-08 DIAGNOSIS — M6281 Muscle weakness (generalized): Secondary | ICD-10-CM

## 2023-06-08 DIAGNOSIS — R278 Other lack of coordination: Secondary | ICD-10-CM

## 2023-06-08 NOTE — Therapy (Signed)
 OUTPATIENT PHYSICAL THERAPY FEMALE PELVIC EVALUATION   Patient Name: Alexandra Gonzales MRN: [id number] DOB:[birth year], 83 y.o., female Today's Date: 06/08/2023  END OF SESSION:  PT End of Session - 06/08/23 1036     Visit Number 6    Date for PT Re-Evaluation 06/15/23    Authorization Type [employer]    Authorization - Visit Number 6    Authorization - Number of Visits 10    PT Start Time 1030    PT Stop Time 1115    PT Time Calculation (min) 45 min    Activity Tolerance Patient tolerated treatment well    Behavior During Therapy WFL for tasks assessed/performed             Past Medical History:  Diagnosis Date   Allergy     Anxiety    Bilateral carotid artery stenosis    Breast cancer (HCC)    Bronchitis    Cancer (HCC)    questionable cancer to nipple   Chronic pain    Colon spasm    Diverticulitis    Dysphagia    Functional gait abnormality    GERD (gastroesophageal reflux disease)    Hyperlipidemia    Hyperlipidemia    Hypertension    IBS (irritable bowel syndrome)    Insomnia    Kidney stones    Low back pain    Lumbar radiculopathy    Myalgia    Neuropathy    Osteopenia    Personal history of radiation therapy    Sciatica    Sleep disorder    Past Surgical History:  Procedure Laterality Date   ABDOMINAL HYSTERECTOMY     BREAST EXCISIONAL BIOPSY     BREAST LUMPECTOMY Left    march 2000   BREAST SURGERY     R lumpectomy 2000   CATARACT EXTRACTION, BILATERAL     EXPLORATORY LAPAROTOMY     EXPLORATORY LAPAROTOMY     EYE SURGERY     bilat cataract removal   MOUTH SURGERY     RADIOACTIVE SEED GUIDED EXCISIONAL BREAST BIOPSY Left 09/26/2016   Procedure: LEFT RADIOACTIVE SEED GUIDED EXCISIONAL BREAST BIOPSY ERAS PATHWAY;  Surgeon: Enid Harry, MD;  Location: [facility];  Service: General;  Laterality: Left;  LMA   Patient Active Problem List   Diagnosis Date Noted   Complicated UTI (urinary tract infection) 12/30/2020    Leukocytosis 12/30/2020   Elevated troponin 12/30/2020   Essential hypertension 12/30/2020   Allergic rhinitis 12/30/2020   Facial spasm 02/29/2016   Esophageal reflux 02/18/2016   Neuropathy 02/18/2016    PCP: Roselind Congo <D  REFERRING PROVIDER: Arma Lamp, MD   REFERRING DIAG:  [phone] (ICD-10-CM) - Levator spasm  K62.89 (ICD-10-CM) - Rectal pain  N81.10 (ICD-10-CM) - Prolapse of anterior vaginal wall    THERAPY DIAG:  Muscle weakness (generalized)  Other lack of coordination  Rationale for Evaluation and Treatment: Rehabilitation  ONSET DATE: 2023  SUBJECTIVE:  SUBJECTIVE STATEMENT:  I got support hose for the swelling in my leg. I am on a steroid for my face breaking out and swelling. Antibiotic and not sure why I am taking it. . I have issues with my sciatica. The rectal pain is better.    Fluid intake: water, coke every 3-4 days,   PAIN:  Are you having pain? Yes NPRS scale: 1/10, when constipated gets to 7/10 04/06/23: 0/10 05/11/23:  pain level 0/10 Pain location: Anal  Pain type: aching Pain description: intermittent   Aggravating factors: when constipated Relieving factors: bowel movement    PRECAUTIONS: Other: breast cancer  RED FLAGS: None   WEIGHT BEARING RESTRICTIONS: No  FALLS:  Has patient fallen in last 6 months? No  OCCUPATION: retired  ACTIVITY LEVEL : no exercise right now  PLOF: Independent  PATIENT GOALS: reduce the rectal pain  PERTINENT HISTORY:  Breast cancer with radiation; Abdominal hysterectomy; Exploratory laparotomy; Osteopenia   BOWEL MOVEMENT: Pain with bowel movement: Yes, when constipated Type of bowel movement:Type (Bristol Stool Scale) Type 4, Frequency daily, Strain trying to not do that, and Splinting  sometimes Fully empty rectum: No Leakage: Yes: sometimes Fiber supplement/laxative Yes   URINATION: Pain with urination: No Fully empty bladder: No Stream: Strong and Weak Urgency: No Frequency: average Leakage: none  INTERCOURSE: not active   PREGNANCY: Vaginal deliveries 2 PROLAPSE: Bulge   OBJECTIVE:  Note: Objective measures were completed at Evaluation unless otherwise noted.  DIAGNOSTIC FINDINGS:  none   COGNITION: Overall cognitive status: Within functional limits for tasks assessed     SENSATION: Light touch: Appears intact   POSTURE: rounded shoulders, forward head, and decreased lumbar lordosis   LUMBARAROM/PROM: Lumbar ROM decreased by 50%   LOWER EXTREMITY ROM:  Passive ROM Right eval Left eval  Hip flexion 110 105  Hip external rotation 45 45   (Blank rows = not tested)  LOWER EXTREMITY MMT: bilateral hip strength is 4/5  PALPATION:    Abdominal: scar from pubic bone to the umbilicus with restrictions; difficulty with contracting the lower abdominal                External Perineal Exam: observed prolapse at the entrance                             Internal Pelvic Floor: tenderness in the levator ani, decreased movement of the puborectalis, tenderness in the coccygeus  Patient confirms identification and approves PT to assess internal pelvic floor and treatment Yes  PELVIC MMT:   MMT eval 04/20/23  Vaginal 2/5 with exception posterior 3/5.  3/5 with weak hug, 5 sec  Internal Anal Sphincter Was 2/5 , after manual 4/5   External Anal Sphincter Was 2/5 , after manual 4/5   Puborectalis Was 0/5 but after manual became 4/5   Diastasis Recti none   (Blank rows = not tested)        PROLAPSE: Anterior wall weakness that goes past the introitus when bearing down in supine  TODAY'S TREATMENT:     06/08/23 Exercises: Strengthening: Sitting pelvic floor contraction feeling it lift off the chair 10 x  Sitting squeeze ball with pelvic  floor contraction and tactile cues to lower abdomen to not put pressure on the pelvic floor 15 x Sitting hip abduction with red band around her knees with core and pelvic floor contraction 2 x 10 Therapeutic activities: Functional strengthening activities: Therapist educated patient on prolapse  management. How to rest laying down when she is feeling the bulge. How to lift with breathing out, how to bend at her hips instead of the waist. Not to strain with bowel movements. Gave patient a handout    05/11/23 Neuromuscular re-education: Core facilitation: Supine hip flexion isometric to engage the lower abdomen 10 x each leg Transverse abdominus contraction 10 x  Form correction: Pelvic floor contraction training: Sitting pelvic floor contraction feeling it lift off the chair Sitting squeeze ball with pelvic floor contraction and tactile cues to lower abdomen to not put pressure on the pelvic floor 15 x Sitting hip abduction with red band around her knees with core and pelvic floor contraction 2 x 10 Sitting marching with red band around her knees with core and pelvic floor contraction 2 x 10  Down training: Educated patient on urge to void to delay the need to urinate, using diaphragmatic breathing, pelvic floor contraction and heel strike Educated patient on double voiding to empty her bladder fully since she sometimes does not urinate at all Self-care: Educated patient on vaginal moisturizers, how to apply to the vulva area and rectum, reason to use vaginal moisturizers and how to take care of the vulvar area     04/20/23 Manual: Internal pelvic floor techniques: No emotional/communication barriers or cognitive limitation. Patient is motivated to learn. Patient understands and agrees with treatment goals and plan. PT explains patient will be examined in standing, sitting, and lying down to see how their muscles and joints work. When they are ready, they will be asked to remove their  underwear so PT can examine their perineum. The patient is also given the option of providing their own chaperone as one is not provided in our facility. The patient also has the right and is explained the right to defer or refuse any part of the evaluation or treatment including the internal exam. With the patient's consent, PT will use one gloved finger to gently assess the muscles of the pelvic floor, seeing how well it contracts and relaxes and if there is muscle symmetry. After, the patient will get dressed and PT and patient will discuss exam findings and plan of care. PT and patient discuss plan of care, schedule, attendance policy and HEP activities.  Going through the vaginal canal working on the levator ani and obturator internist to elongate the muscles Neuromuscular re-education: Pelvic floor contraction training: Sitting marching with red band around her knees with core and pelvic floor contraction 2 x 10  Sitting hip abduction with red band around her knees with core and pelvic floor contraction 2 x 10 Sitting squeeze ball with pelvic floor contraction and tactile cues to lower abdomen to not put pressure on the pelvic floor 15 x Therapist finger in the vaginal canal working on pelvic floor contraction with tactile cues     PATIENT EDUCATION:  04/20/23 Education details: Access Code: [facility] Person educated: Patient Education method: Explanation, Demonstration, Tactile cues, Verbal cues, and Handouts Education comprehension: verbalized understanding, returned demonstration, verbal cues required, tactile cues required, and needs further education  HOME EXERCISE PROGRAM: 04/20/23 Access Code: [facility] URL: [email or web address] Date: 04/20/2023 Prepared by: Marsha Skeen  Exercises - Seated Pelvic Floor Contraction  - 3 x daily - 7 x weekly - 1 sets - 10 reps - Supine Diaphragmatic Breathing  - 2 x daily - 7 x weekly - 1 sets - 10 reps - Hooklying Transversus  Abdominis Palpation  - 1 x daily - 7 x weekly -  1 sets - 10 reps - Hooklying Isometric Hip Flexion  - 1 x daily - 7 x weekly - 1 sets - 10 reps - Seated March with Resistance  - 1 x daily - 7 x weekly - 2 sets - 10 reps - Seated Hip Abduction with Resistance  - 1 x daily - 7 x weekly - 32 sets - 10 reps - Seated Hip Adduction Squeeze with Ball  - 1 x daily - 7 x weekly - 1 sets - 10 reps   ASSESSMENT:  CLINICAL IMPRESSION: Patient is a 83 y.o. female who was seen today for physical therapy  treatment for anterior wall prolapse, rectal pain and levator ani spasm.  Patient is not having rectal pain. She is not leaking urine. She understands ways to manage her prolapse. She is not straining with bowel movements anymore. She is independent with her HEP. She is limited with her HEP at times due to dealing with her left hip pain, sciatica and TMJ. She is going to focus on these things at this time and agrees to be discharged.  If she has issues after her hip, back and TMJ are taking care of she is to return.   OBJECTIVE IMPAIRMENTS: decreased coordination, decreased strength, increased fascial restrictions, increased muscle spasms, and pain.   ACTIVITY LIMITATIONS: bending, standing, continence, toileting, and locomotion level  PARTICIPATION LIMITATIONS: meal prep, cleaning, laundry, driving, shopping, and community activity  PERSONAL FACTORS: 3+ comorbidities: Breast cancer with radiation; Abdominal hysterectomy; Exploratory laparotomy; Osteopenia are also affecting patient's functional outcome.   REHAB POTENTIAL: Good  CLINICAL DECISION MAKING: Evolving/moderate complexity  EVALUATION COMPLEXITY: Moderate   GOALS: Goals reviewed with patient? Yes  SHORT TERM GOALS: Target date: 04/18/23  Patient is independent with initial HEP for pelvic floor contraction.  Baseline: Goal status: Met 05/11/23  2.  Patient reports rectal pain decreased >/= 25% with the daily pain.  Baseline:  Goal  status: Met 04/06/23  3.  Patient educated on diaphragmatic breathing to fully expel her stool without straining.  Baseline:  Goal status: Met 05/11/23   LONG TERM GOALS: Target date: 06/15/23  Patient independent with advanced HEP for pelvic floor and core.  Baseline:  Goal status: Met 06/08/23  2.  Patient reports she is able to empty her rectum 75% of the time due to reduction of levator spasms.  Baseline:  Goal status: Met 05/11/23  3.  Patient educated on how to manage prolapse with pressure management.  Baseline:  Goal status: Met 06/08/23  4.  Patient is able to engage her lower abdominals to be able to push the stool out.  Baseline:  Goal status: Met 06/08/23  5.  Patient reports her rectal pain decreased >/= 75% due to reduction of spasms.  Baseline:  Goal status: Met 04/13/23  PLAN: Discharge to HEP this visit    Marsha Skeen, PT 06/08/23 11:27 AM   PHYSICAL THERAPY DISCHARGE SUMMARY  Visits from Start of Care: 6  Current functional level related to goals / functional outcomes: See above.    Remaining deficits: See above.    Education / Equipment: HEP   Patient agrees to discharge. Patient goals were met. Patient is being discharged due to meeting the stated rehab goals. Thank you for the referral.   Marsha Skeen, PT 06/08/23 11:27 AM

## 2023-06-08 NOTE — Patient Instructions (Addendum)
About Pelvic Support Problems Pelvic Support Problems Explained Ligaments, muscles, and connective tissue normally hold your bladder, uterus, and other organs in their proper places in your pelvis. When these tissues become weak, a problem with pelvic support may result. Weak support can cause one or more of the pelvic organs to drop down into the vagina. An organ may even drop so far that is partially exposed outside the body.  Pelvic support problems are named by the change in the organ. The main types of pelvic support problems are:  Cystocele: When the bladder drops down into your vagina.  Enterocele: When your small intestine drops between your vagina and rectum.  Rectocele: When your rectum bulges into the vaginal wall.  Uterine prolapse: When your uterus drops into your vagina.  Vaginal prolapse: When the top part of the vagina begins to droop. This sometimes happens after a hysterectomy (removal of the uterus).  Causes Pelvic support problems can be caused by many conditions. They may begin after you give birth, especially if you had a large baby. During childbirth, the muscles and skin of the birth canal (vagina) are stretched and sometimes torn. They heal over time but are not always exactly the same. A long pushing stage of labor may also weaken these tissues as well as very rapid births as the tissues do not have time to stretch so they tear.  Also, after menopause, there are changes in the vaginal walls resulting from a decrease in estrogen. Estrogen helps to keep the tissues toned. Low levels of estrogen weaken the vaginal walls and may cause the bladder to shift from its normal position. As women get older, the loss of muscle tone and the relaxation of muscles may cause the uterus or other organs to drop.  Over time, conditions like chronic coughing, chronic constipation, doing a lot of heavy lifting, straining to pass stool, and obesity, can also weaken the pelvic support muscles.   Diagnosing Pelvic Support Problems Your health care provider will ask about your symptoms and do a pelvic examination. Your provider may also do a rectal exam during your pelvic exam. Your provider may ask you to: 1. Bear down and push (like you are having a bowel movement) so he or she can see if your bladder or other part of your body protrudes into the vagina. 2. Contract the muscles of your pelvis to check the strength of your pelvic muscles.  3. Do several types of urine, nerve and muscle tests of the pelvis and around the bladder to see what type of treatment is best for you.   Symptoms Symptoms of pelvic support problems depend on the organ involved, but may include:  urine leakage  stain or fecal loss after a bowel movement trouble having bowel movements  ache in the lower abdomen, groin, or lower back  bladder infection  a feeling of heaviness, pulling, or fullness in the pelvis, or a feeling that something is falling out of the vagina  an organ protruding from your vaginal opening  feeling the need to support the organs or perineal area to empty bladder or bowels painful sexual intercourse.  Many women feel pelvic pressure or trouble holding their urine immediately after childbirth. For some, these symptoms go away permanently, in others they return as they get older.  Treatment Options A prolapsed organ cannot repair itself. Contact your health care provider as soon as you notice symptoms of a problem. Treatment depends on what the specific problem is and how far  advanced it is.  The symptoms caused by some pelvic support problems may simply be treated with changes in diet, medicine to soften the stool, weight loss, or avoiding strenuous activities. You may also do pelvic floor exercises to help strengthen your pelvic muscles.  Some cases of prolapse may require a special support device made from plastic or rubber called a pessary that fits into the vagina to support the uterus,  vagina, or bladder. A pessary can also help women who leak urine when coughing, straining, or exercising. In mild cases, a tampon or vaginal diaphragm may be used instead of a pessary.  Talk to your doctor or health care provider about these options. In serious cases, surgery may be needed to put the organs back into their proper place. The uterus may be removed because of the pressure it puts on the bladder.  Your doctor will know what surgery will be best for you. How can I prevent pelvic support problems?  You can help prevent pelvic support problems by:  maintaining a healthy lifestyle  continuing to do pelvic floor exercises after you deliver a baby  maintaining a healthy weight  avoiding a lot of heavy lifting and lifting with your legs (not from your waist)  treating constipation and avoid getting  Earlie Counts, PT [facility] [place] [address] [place], [place] [address] W: [phone] [email or web address]

## 2023-06-13 DIAGNOSIS — M5416 Radiculopathy, lumbar region: Secondary | ICD-10-CM | POA: Diagnosis not present

## 2023-06-15 ENCOUNTER — Encounter: Admitting: Physical Therapy

## 2023-06-17 IMAGING — MG MM DIGITAL SCREENING BILAT W/ TOMO AND CAD
8 series · 8 of 24 positions shown · non-contrast
Comparison: Previous exam(s).

CLINICAL DATA: Screening.

EXAM:
DIGITAL SCREENING BILATERAL MAMMOGRAM WITH TOMOSYNTHESIS AND CAD
TECHNIQUE: Bilateral screening digital craniocaudal and mediolateral oblique
mammograms were obtained. Bilateral screening digital breast
tomosynthesis was performed. The images were evaluated with
computer-aided detection.

[R CC synth-2D]
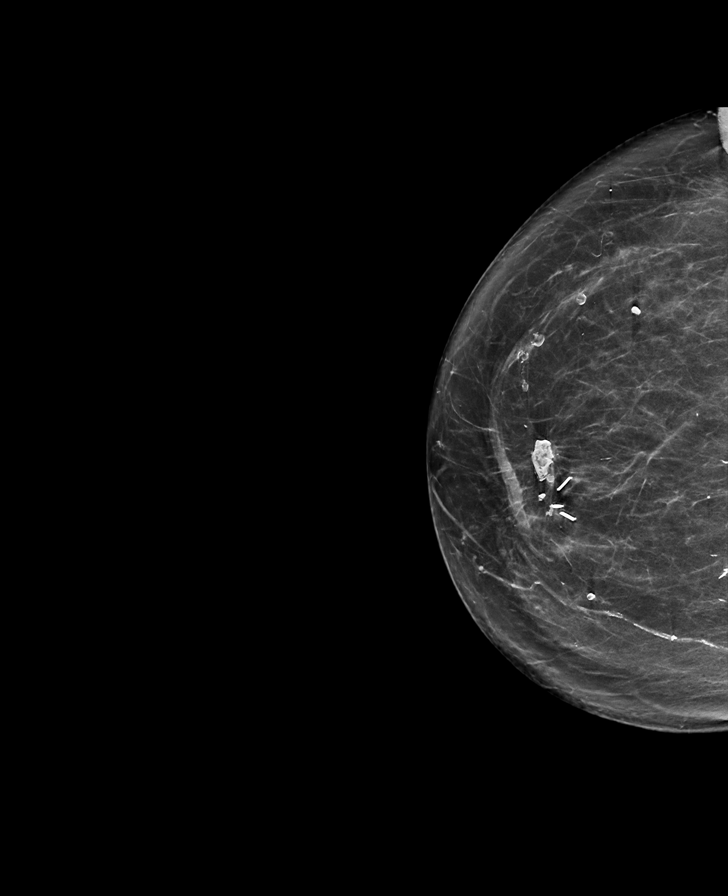

[R MLO synth-2D]
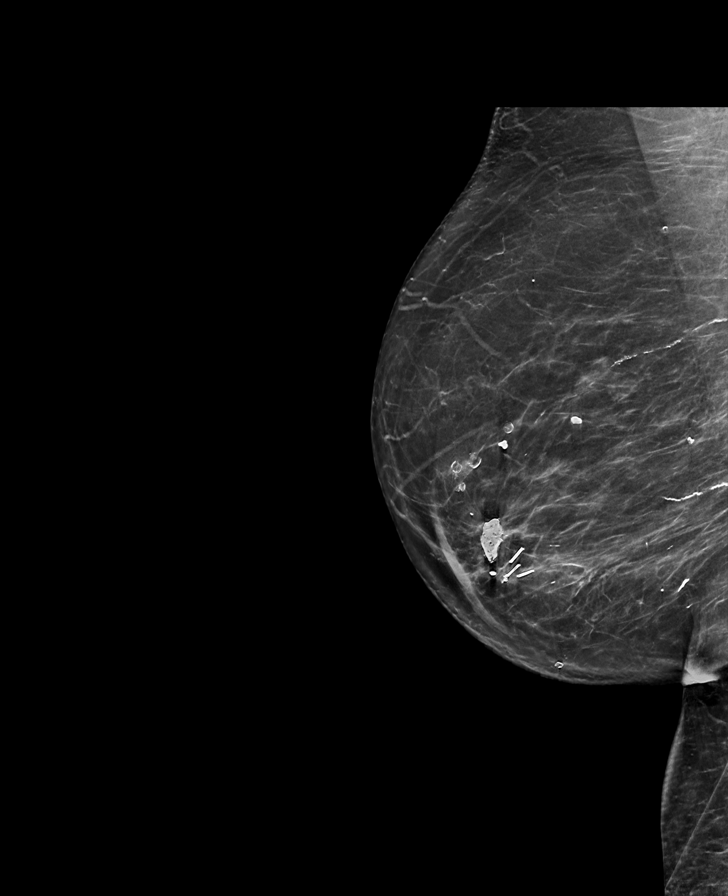

[L MLO synth-2D]
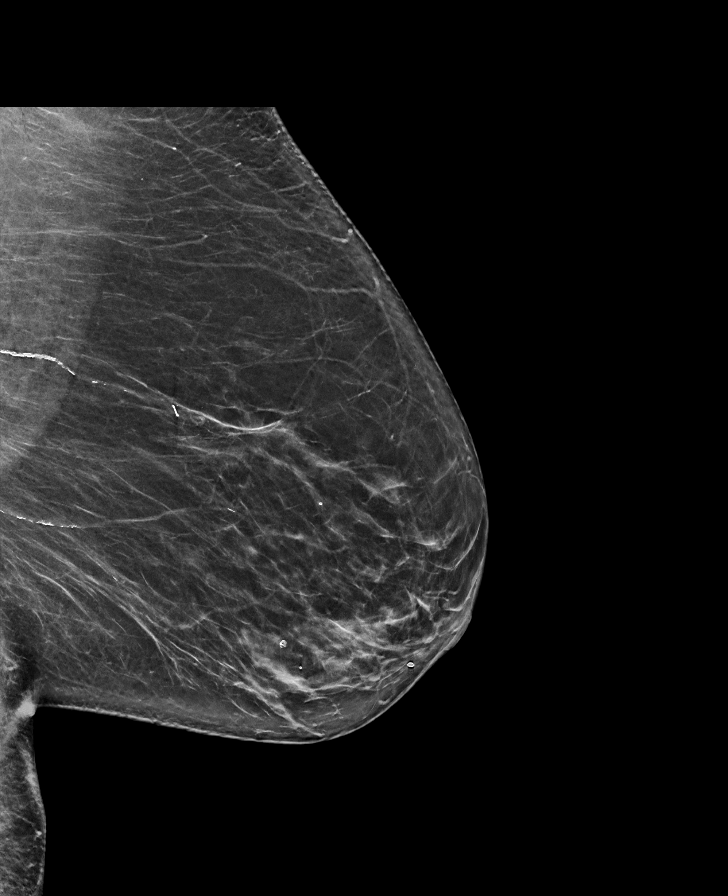

[L CC synth-2D]
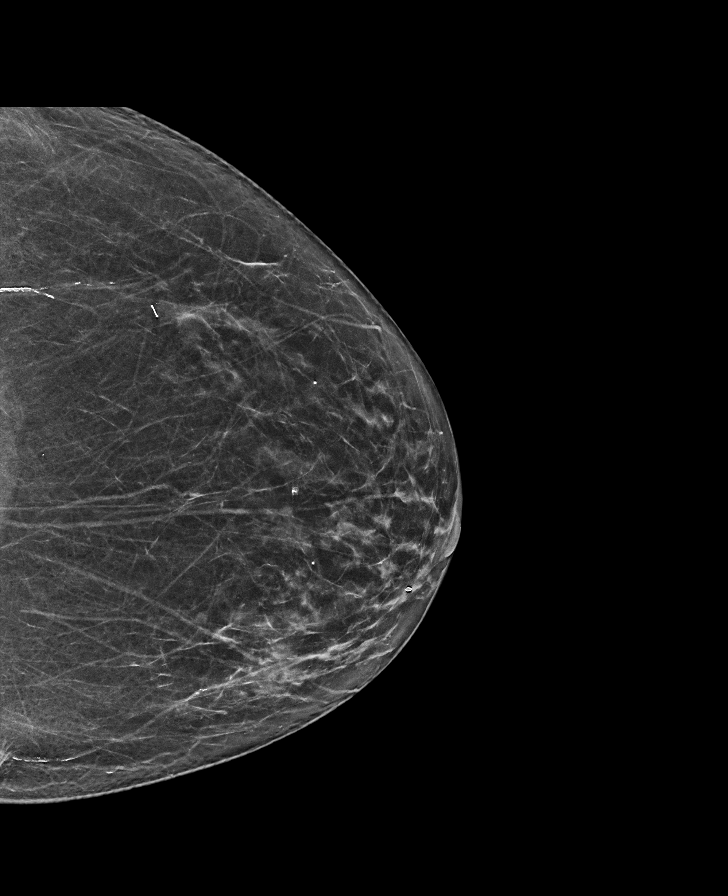

[R MLO tomo · tomo slice 37/72.0]
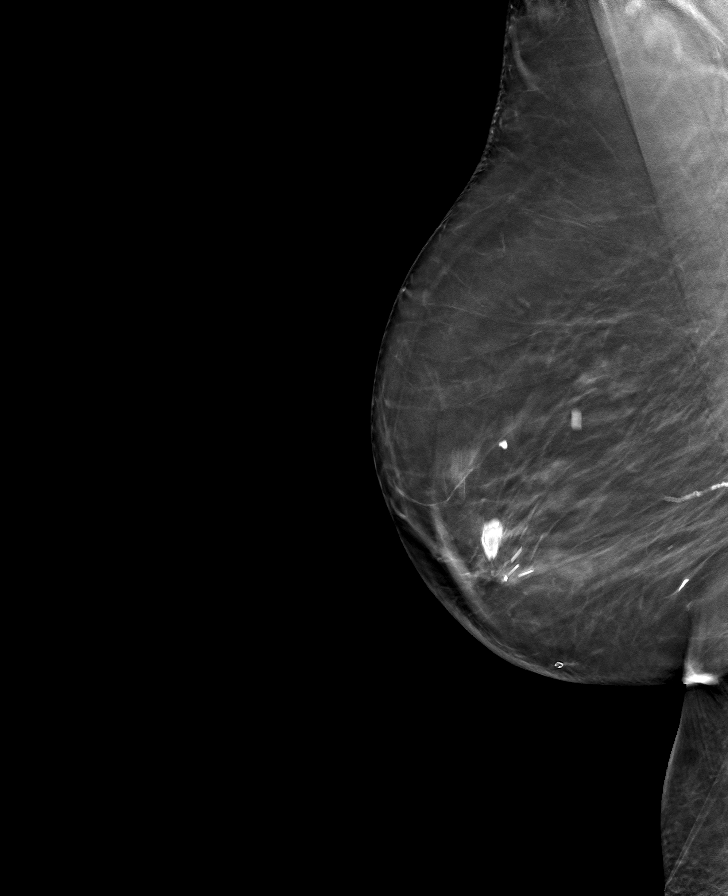

[L CC tomo · tomo slice 33/64.0]
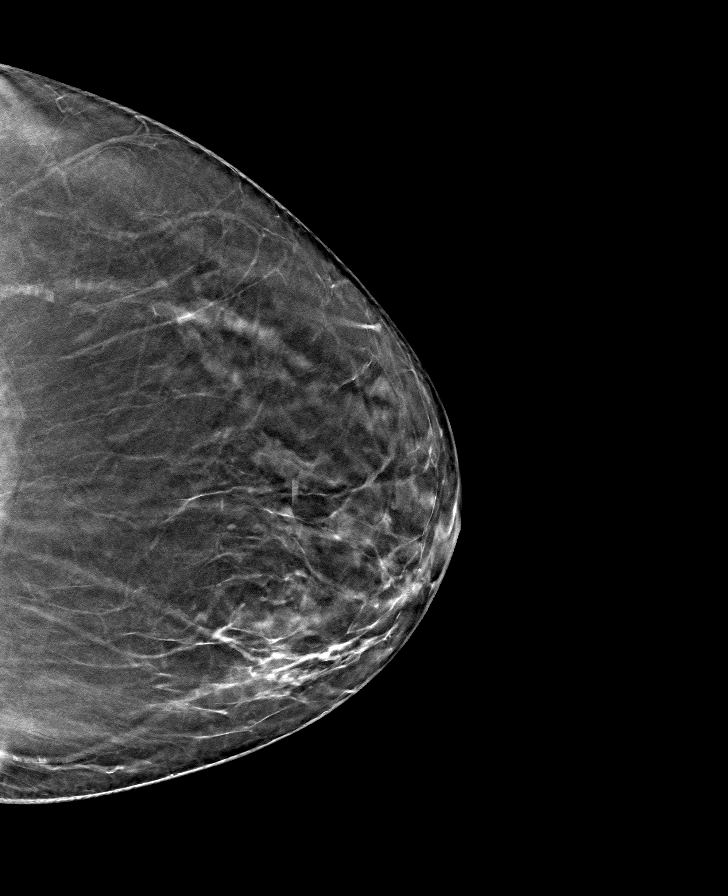

[R CC tomo · tomo slice 37/74.0]
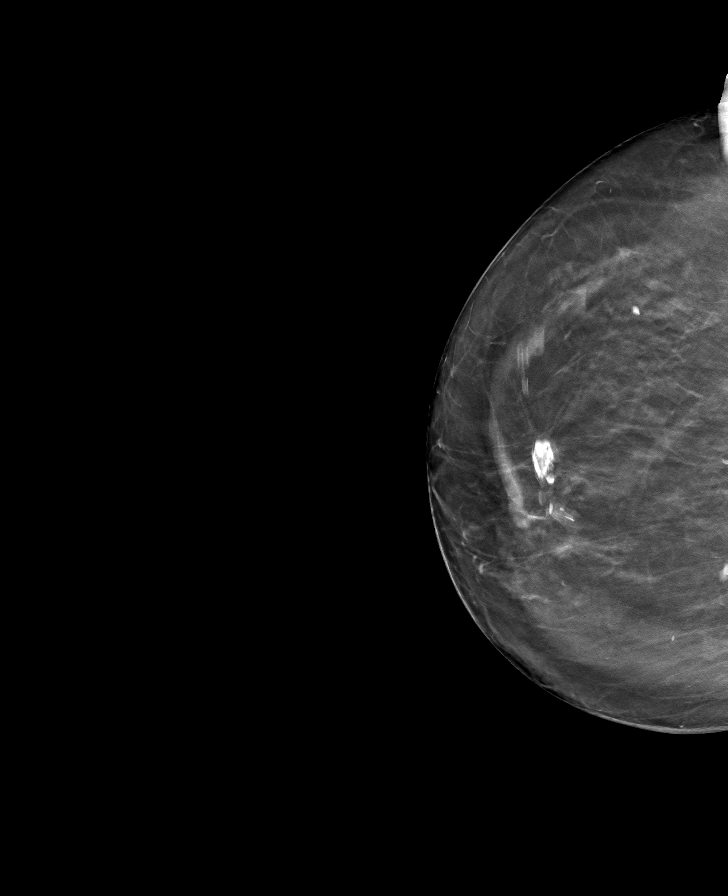

[L MLO tomo · tomo slice 37/72.0]
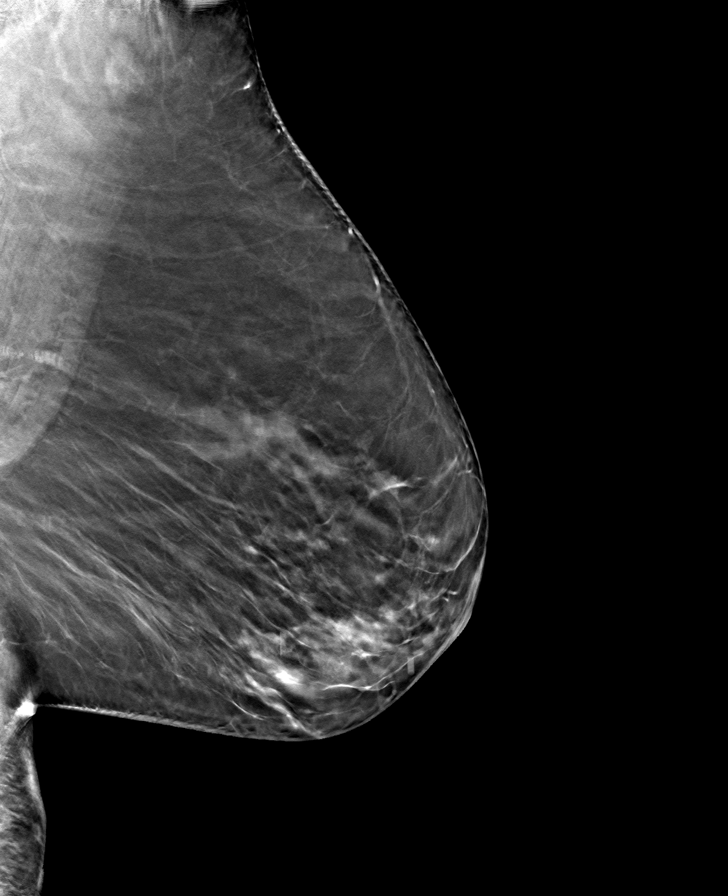

[8 of 24 positions shown; findings below may reference images not displayed]

ACR Breast Density Category b: There are scattered areas of
fibroglandular density.
FINDINGS: There are no findings suspicious for malignancy.
IMPRESSION: No mammographic evidence of malignancy. A result letter of this
screening mammogram will be mailed directly to the patient.

RECOMMENDATION:
Screening mammogram in one year. (Code:51-O-LD2)

BI-RADS CATEGORY  1: Negative.

## 2023-07-07 DIAGNOSIS — M533 Sacrococcygeal disorders, not elsewhere classified: Secondary | ICD-10-CM | POA: Diagnosis not present

## 2023-07-07 DIAGNOSIS — M5416 Radiculopathy, lumbar region: Secondary | ICD-10-CM | POA: Diagnosis not present

## 2023-07-07 DIAGNOSIS — R42 Dizziness and giddiness: Secondary | ICD-10-CM | POA: Diagnosis not present

## 2023-07-07 DIAGNOSIS — R600 Localized edema: Secondary | ICD-10-CM | POA: Diagnosis not present

## 2023-07-07 DIAGNOSIS — I10 Essential (primary) hypertension: Secondary | ICD-10-CM | POA: Diagnosis not present

## 2023-07-07 DIAGNOSIS — M47816 Spondylosis without myelopathy or radiculopathy, lumbar region: Secondary | ICD-10-CM | POA: Diagnosis not present

## 2023-07-07 DIAGNOSIS — R011 Cardiac murmur, unspecified: Secondary | ICD-10-CM | POA: Diagnosis not present

## 2023-07-10 ENCOUNTER — Other Ambulatory Visit (HOSPITAL_COMMUNITY): Payer: Self-pay | Admitting: Family Medicine

## 2023-07-10 DIAGNOSIS — I10 Essential (primary) hypertension: Secondary | ICD-10-CM | POA: Diagnosis not present

## 2023-07-10 DIAGNOSIS — E7800 Pure hypercholesterolemia, unspecified: Secondary | ICD-10-CM | POA: Diagnosis not present

## 2023-07-10 DIAGNOSIS — R011 Cardiac murmur, unspecified: Secondary | ICD-10-CM

## 2023-07-13 DIAGNOSIS — H04123 Dry eye syndrome of bilateral lacrimal glands: Secondary | ICD-10-CM | POA: Diagnosis not present

## 2023-07-13 DIAGNOSIS — H4322 Crystalline deposits in vitreous body, left eye: Secondary | ICD-10-CM | POA: Diagnosis not present

## 2023-07-13 DIAGNOSIS — H40013 Open angle with borderline findings, low risk, bilateral: Secondary | ICD-10-CM | POA: Diagnosis not present

## 2023-07-13 DIAGNOSIS — H0102A Squamous blepharitis right eye, upper and lower eyelids: Secondary | ICD-10-CM | POA: Diagnosis not present

## 2023-07-13 DIAGNOSIS — H0102B Squamous blepharitis left eye, upper and lower eyelids: Secondary | ICD-10-CM | POA: Diagnosis not present

## 2023-07-13 DIAGNOSIS — H02834 Dermatochalasis of left upper eyelid: Secondary | ICD-10-CM | POA: Diagnosis not present

## 2023-07-13 DIAGNOSIS — H02831 Dermatochalasis of right upper eyelid: Secondary | ICD-10-CM | POA: Diagnosis not present

## 2023-07-13 DIAGNOSIS — Z961 Presence of intraocular lens: Secondary | ICD-10-CM | POA: Diagnosis not present

## 2023-07-13 DIAGNOSIS — D3132 Benign neoplasm of left choroid: Secondary | ICD-10-CM | POA: Diagnosis not present

## 2023-07-18 DIAGNOSIS — M5416 Radiculopathy, lumbar region: Secondary | ICD-10-CM | POA: Diagnosis not present

## 2023-07-25 ENCOUNTER — Encounter: Admitting: Obstetrics and Gynecology

## 2023-07-31 DIAGNOSIS — N952 Postmenopausal atrophic vaginitis: Secondary | ICD-10-CM | POA: Diagnosis not present

## 2023-07-31 DIAGNOSIS — M549 Dorsalgia, unspecified: Secondary | ICD-10-CM | POA: Diagnosis not present

## 2023-07-31 DIAGNOSIS — R109 Unspecified abdominal pain: Secondary | ICD-10-CM | POA: Diagnosis not present

## 2023-08-10 DIAGNOSIS — I10 Essential (primary) hypertension: Secondary | ICD-10-CM | POA: Diagnosis not present

## 2023-08-10 DIAGNOSIS — E7800 Pure hypercholesterolemia, unspecified: Secondary | ICD-10-CM | POA: Diagnosis not present

## 2023-08-14 DIAGNOSIS — M47896 Other spondylosis, lumbar region: Secondary | ICD-10-CM | POA: Diagnosis not present

## 2023-08-14 DIAGNOSIS — R011 Cardiac murmur, unspecified: Secondary | ICD-10-CM | POA: Diagnosis not present

## 2023-08-14 DIAGNOSIS — M5416 Radiculopathy, lumbar region: Secondary | ICD-10-CM | POA: Diagnosis not present

## 2023-08-14 DIAGNOSIS — M533 Sacrococcygeal disorders, not elsewhere classified: Secondary | ICD-10-CM | POA: Diagnosis not present

## 2023-08-14 DIAGNOSIS — R0602 Shortness of breath: Secondary | ICD-10-CM | POA: Diagnosis not present

## 2023-08-14 DIAGNOSIS — R42 Dizziness and giddiness: Secondary | ICD-10-CM | POA: Diagnosis not present

## 2023-08-16 ENCOUNTER — Ambulatory Visit (INDEPENDENT_AMBULATORY_CARE_PROVIDER_SITE_OTHER): Admitting: Obstetrics and Gynecology

## 2023-08-16 ENCOUNTER — Encounter: Payer: Self-pay | Admitting: Obstetrics and Gynecology

## 2023-08-16 VITALS — BP 106/63 | HR 65

## 2023-08-16 DIAGNOSIS — R948 Abnormal results of function studies of other organs and systems: Secondary | ICD-10-CM | POA: Diagnosis not present

## 2023-08-16 DIAGNOSIS — N393 Stress incontinence (female) (male): Secondary | ICD-10-CM

## 2023-08-16 DIAGNOSIS — R350 Frequency of micturition: Secondary | ICD-10-CM

## 2023-08-16 DIAGNOSIS — N3941 Urge incontinence: Secondary | ICD-10-CM

## 2023-08-16 DIAGNOSIS — N8110 Cystocele, unspecified: Secondary | ICD-10-CM

## 2023-08-16 DIAGNOSIS — N3281 Overactive bladder: Secondary | ICD-10-CM

## 2023-08-16 DIAGNOSIS — R339 Retention of urine, unspecified: Secondary | ICD-10-CM

## 2023-08-16 LAB — POCT URINALYSIS DIP (CLINITEK)
Bilirubin, UA: NEGATIVE
Blood, UA: NEGATIVE
Glucose, UA: NEGATIVE mg/dL
Ketones, POC UA: NEGATIVE mg/dL
Leukocytes, UA: NEGATIVE
Nitrite, UA: NEGATIVE
POC PROTEIN,UA: NEGATIVE
Spec Grav, UA: 1.01 (ref 1.010–1.025)
Urobilinogen, UA: 0.2 U/dL
pH, UA: 6.5 (ref 5.0–8.0)

## 2023-08-16 NOTE — Progress Notes (Signed)
 [facility] Urodynamics Procedure  Referring Physician: Arloa Elsie SAUNDERS, MD Date of Procedure: 08/16/2023  Alexandra Gonzales is a 83 y.o. female who presents for urodynamic evaluation. Indication(s) for study: mixed incontinence  Vital Signs: BP 106/63   Pulse 65   LMP  (LMP Unknown)   Laboratory Results: A catheterized urine specimen revealed:  POC urine:  Lab Results  Component Value Date   COLORU yellow 08/16/2023   CLARITYU clear 08/16/2023   GLUCOSEUR negative 08/16/2023   BILIRUBINUR negative 08/16/2023   KETONESU negative 09/15/2022   SPECGRAV 1.010 08/16/2023   RBCUR negative 08/16/2023   PHUR 6.5 08/16/2023   PROTEINUR Negative 09/15/2022   UROBILINOGEN 0.2 08/16/2023   LEUKOCYTESUR Negative 08/16/2023     Voiding Diary: Deferred  Procedure Timeout:  The correct patient was verified and the correct procedure was verified. The patient was in the correct position and safety precautions were reviewed based on at the patient's history.  Urodynamic Procedure A 36F dual lumen urodynamics catheter was placed under sterile conditions into the patient's bladder. A 36F catheter was placed into the rectum in order to measure abdominal pressure. EMG patches were placed in the appropriate position.  All connections were confirmed and calibrations/adjusted made. Saline was instilled into the bladder through the dual lumen catheters.  Cough/valsalva pressures were measured periodically during filling.  Patient was allowed to void.  The bladder was then emptied of its residual.  UROFLOW: Revealed a Qmax of 8 mL/sec.  She voided 73 mL and had a residual of 25 mL.  It was a interrupted pattern and represented normal habits though interpretation limited due to low voided volume.  CMG: This was performed with sterile water in the sitting position at a fill rate of 30 mL/min.    First sensation of fullness was 114 mLs,  First urge was 123 mLs,  Strong urge was 285 mLs and   Capacity was 323 mLs  Stress incontinence was demonstrated Highest positive Barrier CLPP was 60 cmH20 at 285 ml. Highest positive Barrier VLPP was 54 cmH20 at 285 ml.  Detrusor function was overactive, with phasic contractions seen.  The first occurred at 122 mL to 4 cm of water and was associated with urge.  Compliance:  Low. End fill detrusor pressure was 30cmH20.  Calculated compliance was 34mL/cmH20  UPP: MUCP with barrier reduction was 37 cm of water.    MICTURITION STUDY: Voiding was performed with reduction using scopettes in the sitting position.  Pdet at Qmax was 47 cm of water.  Qmax was 13.3 mL/sec.  It was a interrupted pattern.  She voided 263 mL and had a residual of 60 mL.  It was a volitional void, sustained detrusor contraction was present and abdominal straining was not present  EMG: This was performed with patches.  She had voluntary contractions, recruitment with fill was present and urethral sphincter was not relaxed with void.  The details of the procedure with the study tracings have been scanned into EPIC.   Urodynamic Impression:  1. Sensation was normal; capacity was reduced 2. Stress Incontinence was demonstrated at normal pressures; 3. 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 4. Emptying was dysfunctional with a normal PVR, a sustained detrusor contraction present,  abdominal straining not present, dyssynergic urethral sphincter activity on EMG.  Plan: - The patient will follow up  to discuss the findings and treatment options.

## 2023-08-16 NOTE — Progress Notes (Deleted)
 pro

## 2023-08-17 DIAGNOSIS — M545 Low back pain, unspecified: Secondary | ICD-10-CM | POA: Diagnosis not present

## 2023-08-20 DIAGNOSIS — I10 Essential (primary) hypertension: Secondary | ICD-10-CM | POA: Diagnosis not present

## 2023-08-25 DIAGNOSIS — M545 Low back pain, unspecified: Secondary | ICD-10-CM | POA: Diagnosis not present

## 2023-08-29 ENCOUNTER — Encounter: Payer: Self-pay | Admitting: Obstetrics and Gynecology

## 2023-08-29 ENCOUNTER — Ambulatory Visit (INDEPENDENT_AMBULATORY_CARE_PROVIDER_SITE_OTHER): Admitting: Obstetrics and Gynecology

## 2023-08-29 VITALS — BP 134/74 | HR 67

## 2023-08-29 DIAGNOSIS — N3281 Overactive bladder: Secondary | ICD-10-CM | POA: Insufficient documentation

## 2023-08-29 DIAGNOSIS — N993 Prolapse of vaginal vault after hysterectomy: Secondary | ICD-10-CM | POA: Insufficient documentation

## 2023-08-29 DIAGNOSIS — N393 Stress incontinence (female) (male): Secondary | ICD-10-CM | POA: Insufficient documentation

## 2023-08-29 MED ORDER — TROSPIUM CHLORIDE ER 60 MG PO CP24: 1.0000 | ORAL_CAPSULE | Freq: Every day | ORAL | 5 refills | Status: AC

## 2023-08-29 NOTE — Assessment & Plan Note (Signed)
-   She is not sure she is interested in surgery. She wants the option to stay sexually active in the future. Would want something that is not too invasive.  - We reviewed the option of sacrospinous fixation with anterior repair. Handouts provided for her to review.

## 2023-08-29 NOTE — Progress Notes (Unsigned)
 [facility] Return Visit  SUBJECTIVE  History of Present Illness: Alexandra Gonzales is a 83 y.o. female seen in follow-up for urinary urgency and stage II prolapse. Previously used a pessary. She underwent urodynamic testing.   Her most bothersome symptom is her urinary urgency and frequency. Drinking 2-4 bottles water, 1 cup coffee every 3 days, coke when she goes out. She occasionally feels the vaginal bulge, but it comes and goes. Doesn't like the pressure when it is there. She does not like the idea of another pessary.   She is taking probiotic to helping constipation. Having daily bowel movements.   Urodynamic Impression:  1. Sensation was normal; capacity was reduced 2. Stress Incontinence was demonstrated at normal pressures; 3. 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 4. Emptying was dysfunctional with a normal PVR (60ml), a sustained detrusor contraction present,  abdominal straining not present, dyssynergic urethral sphincter activity on EMG.  Past Medical History: Patient  has a past medical history of Allergy , Anxiety, Bilateral carotid artery stenosis, Breast cancer (HCC), Bronchitis, Cancer (HCC), Chronic pain, Colon spasm, Diverticulitis, Dysphagia, Functional gait abnormality, GERD (gastroesophageal reflux disease), Hyperlipidemia, Hyperlipidemia, Hypertension, IBS (irritable bowel syndrome), Insomnia, Kidney stones, Low back pain, Lumbar radiculopathy, Myalgia, Neuropathy, Osteopenia, Personal history of radiation therapy, Sciatica, and Sleep disorder.   Past Surgical History: She  has a past surgical history that includes Abdominal hysterectomy; Exploratory laparotomy; Breast surgery; Eye surgery; Mouth surgery; Breast lumpectomy (Left); Radioactive seed guided excisional breast biopsy (Left, 09/26/2016); Breast excisional biopsy; Exploratory laparotomy; and Cataract extraction, bilateral.   Medications: She has a current medication list which includes  the following prescription(s): acetaminophen , amlodipine , cetirizine , dicyclomine, estradiol, furosemide, hydrocortisone , losartan , melatonin, polyvinyl alcohol-povidone, probiotic-10, and trospium  chloride.   Allergies: Patient is allergic to morphine  and codeine, codeine, dexamethasone , erythromycin, hydrocodone, lyrica [pregabalin], prevacid [lansoprazole], clarithromycin, famotidine , latex, nitrofuran derivatives, polytrim [polymyxin b-trimethoprim], prednisone, ranitidine, sulfa antibiotics, zantac [ranitidine hcl], and penicillins.   Social History: Patient  reports that she has never smoked. She has never used smokeless tobacco. She reports that she does not drink alcohol and does not use drugs.     OBJECTIVE     Physical Exam: Vitals:   08/29/23 1410  BP: 134/74  Pulse: 67   Gen: No apparent distress, A&O x 3.  Prior exam:  POP-Q:    POP-Q   0                                            Aa   0                                           Ba   -5.5                                              C    3                                            Gh   4  Pb   6.5                                            tvl    -2                                            Ap   -2                                            Bp                                                  D        ASSESSMENT AND PLAN    Alexandra Gonzales is a 83 y.o. with:  1. Overactive bladder   2. Vaginal vault prolapse after hysterectomy   3. SUI (stress urinary incontinence, female)     Overactive bladder Assessment & Plan: - We reviewed treatment options for overactive bladder including bladder retraining with timed voiding, pelvic PT and medication.  - She would like to work on bladder retraining on her own with the assistance of medication. Prescribed Trospium  60mg  ER daily.  - Also reviewed avoiding caffeine and irritative beverages  Orders: -     Trospium   Chloride ER; Take 1 capsule (60 mg total) by mouth daily.  Dispense: 30 capsule; Refill: 5  Vaginal vault prolapse after hysterectomy Assessment & Plan: - She is not sure she is interested in surgery. She wants the option to stay sexually active in the future. Would want something that is not too invasive.  - We reviewed the option of sacrospinous fixation with anterior repair. Handouts provided for her to review.    SUI (stress urinary incontinence, female) Assessment & Plan: - She feels this is occurring rarely and would like to defer treatment at this time. We discussed that if she decides to pursue surgery for prolapse, then should consider treatment for this concurrently as symptoms may worsen post-operatively.    Return 6 weeks   Alexandra LOISE Caper, MD

## 2023-08-29 NOTE — Patient Instructions (Addendum)
 Today we talked about ways to manage bladder urgency such as altering your diet to avoid irritative beverages and foods (bladder diet) as well as attempting to decrease stress and other exacerbating factors.    The Most Bothersome Foods* The Least Bothersome Foods*  Coffee - Regular & Decaf Tea - caffeinated Carbonated beverages - cola, non-colas, diet & caffeine-free Alcohols - Beer, Red Wine, White Wine, [address] - Grapefruit, [place], Orange, [employer] - Cranberry, Grapefruit, Orange, Pineapple Vegetables - Tomato & Tomato Products Flavor Enhancers - Hot peppers, Spicy foods, Chili, Horseradish, Vinegar, Monosodium glutamate (MSG) Artificial Sweeteners - NutraSweet, Sweet 'N Low, Equal (sweetener), Saccharin Ethnic foods - [place], [place], [place] food [employer] - low-fat & whole Fruits - Bananas, Blueberries, Honeydew melon, Pears, Raisins, Watermelon Vegetables - Broccoli, [address] Sprouts, [place], Carrots, Cauliflower, [place], Cucumber, Mushrooms, Peas, Radishes, Squash, Zucchini, White potatoes, Sweet potatoes & yams Poultry - Chicken, Eggs, [place], [employer] - Beef, Diplomatic Services operational officer, Lamb Seafood - Shrimp, [place] fish, Salmon Grains - Oat, Rice Snacks - Pretzels, Popcorn  *Mitch ALF et al. Diet and its role in interstitial cystitis/bladder pain syndrome (IC/BPS) and comorbid conditions. [employer]. BJU Int. 2012 Jan 11.   We discussed the symptoms of overactive bladder (OAB), which include urinary urgency, urinary frequency, night-time urination, with or without urge incontinence.  We discussed management including behavioral therapy (decreasing bladder irritants by following a bladder diet, urge suppression strategies, timed voids, bladder retraining), physical therapy, medication; and for refractory cases posterior tibial nerve stimulation, sacral neuromodulation, and intravesical botulinum toxin injection.   For anticholinergic medications, we discussed the potential  side effects of anticholinergics including dry eyes, dry mouth, constipation, rare risks of cognitive impairment and urinary retention. You were prescribed Trospium  60mg  daily.   It can take a month to start working so give it time, but if you have bothersome side effects call sooner and we can try a different medication.  Call us  if you have trouble filling the prescription or if it's not covered by your insurance.

## 2023-08-30 ENCOUNTER — Other Ambulatory Visit (HOSPITAL_COMMUNITY)

## 2023-08-30 ENCOUNTER — Encounter (HOSPITAL_COMMUNITY): Payer: Self-pay

## 2023-08-30 NOTE — Assessment & Plan Note (Signed)
-   She feels this is occurring rarely and would like to defer treatment at this time. We discussed that if she decides to pursue surgery for prolapse, then should consider treatment for this concurrently as symptoms may worsen post-operatively.

## 2023-08-30 NOTE — Assessment & Plan Note (Addendum)
-   We reviewed treatment options for overactive bladder including bladder retraining with timed voiding, pelvic PT and medication.  - She would like to work on bladder retraining on her own with the assistance of medication. Prescribed Trospium  60mg  ER daily.  - Also reviewed avoiding caffeine and irritative beverages

## 2023-09-05 DIAGNOSIS — M545 Low back pain, unspecified: Secondary | ICD-10-CM | POA: Diagnosis not present

## 2023-09-05 DIAGNOSIS — N3281 Overactive bladder: Secondary | ICD-10-CM | POA: Diagnosis not present

## 2023-09-05 DIAGNOSIS — R7303 Prediabetes: Secondary | ICD-10-CM | POA: Diagnosis not present

## 2023-09-05 DIAGNOSIS — E7800 Pure hypercholesterolemia, unspecified: Secondary | ICD-10-CM | POA: Diagnosis not present

## 2023-09-05 DIAGNOSIS — I10 Essential (primary) hypertension: Secondary | ICD-10-CM | POA: Diagnosis not present

## 2023-09-07 DIAGNOSIS — M545 Low back pain, unspecified: Secondary | ICD-10-CM | POA: Diagnosis not present

## 2023-09-10 DIAGNOSIS — E7800 Pure hypercholesterolemia, unspecified: Secondary | ICD-10-CM | POA: Diagnosis not present

## 2023-09-10 DIAGNOSIS — I10 Essential (primary) hypertension: Secondary | ICD-10-CM | POA: Diagnosis not present

## 2023-09-19 DIAGNOSIS — I10 Essential (primary) hypertension: Secondary | ICD-10-CM | POA: Diagnosis not present

## 2023-09-21 ENCOUNTER — Ambulatory Visit
Admission: RE | Admit: 2023-09-21 | Discharge: 2023-09-21 | Disposition: A | Discharge status: Home / Self Care | Source: Ambulatory Visit | Attending: Obstetrics and Gynecology | Admitting: Obstetrics and Gynecology

## 2023-09-21 DIAGNOSIS — Z1231 Encounter for screening mammogram for malignant neoplasm of breast: Secondary | ICD-10-CM

## 2023-09-22 DIAGNOSIS — M545 Low back pain, unspecified: Secondary | ICD-10-CM | POA: Diagnosis not present

## 2023-10-06 DIAGNOSIS — M545 Low back pain, unspecified: Secondary | ICD-10-CM | POA: Diagnosis not present

## 2023-10-10 DIAGNOSIS — K582 Mixed irritable bowel syndrome: Secondary | ICD-10-CM | POA: Diagnosis not present

## 2023-10-10 DIAGNOSIS — M545 Low back pain, unspecified: Secondary | ICD-10-CM | POA: Diagnosis not present

## 2023-10-10 DIAGNOSIS — R197 Diarrhea, unspecified: Secondary | ICD-10-CM | POA: Diagnosis not present

## 2023-10-10 DIAGNOSIS — E7800 Pure hypercholesterolemia, unspecified: Secondary | ICD-10-CM | POA: Diagnosis not present

## 2023-10-10 DIAGNOSIS — I10 Essential (primary) hypertension: Secondary | ICD-10-CM | POA: Diagnosis not present

## 2023-10-10 DIAGNOSIS — Z03818 Encounter for observation for suspected exposure to other biological agents ruled out: Secondary | ICD-10-CM | POA: Diagnosis not present

## 2023-10-10 DIAGNOSIS — R0981 Nasal congestion: Secondary | ICD-10-CM | POA: Diagnosis not present

## 2023-10-10 DIAGNOSIS — R109 Unspecified abdominal pain: Secondary | ICD-10-CM | POA: Diagnosis not present

## 2023-10-12 ENCOUNTER — Other Ambulatory Visit (HOSPITAL_COMMUNITY): Payer: Self-pay | Admitting: Gastroenterology

## 2023-10-12 DIAGNOSIS — K219 Gastro-esophageal reflux disease without esophagitis: Secondary | ICD-10-CM | POA: Diagnosis not present

## 2023-10-12 DIAGNOSIS — Z860100 Personal history of colon polyps, unspecified: Secondary | ICD-10-CM | POA: Diagnosis not present

## 2023-10-12 DIAGNOSIS — R1033 Periumbilical pain: Secondary | ICD-10-CM

## 2023-10-12 DIAGNOSIS — R109 Unspecified abdominal pain: Secondary | ICD-10-CM | POA: Diagnosis not present

## 2023-10-12 DIAGNOSIS — R194 Change in bowel habit: Secondary | ICD-10-CM | POA: Diagnosis not present

## 2023-10-17 ENCOUNTER — Ambulatory Visit: Admitting: Obstetrics and Gynecology

## 2023-10-17 ENCOUNTER — Encounter: Payer: Self-pay | Admitting: Obstetrics and Gynecology

## 2023-10-17 VITALS — BP 134/74 | HR 58

## 2023-10-17 DIAGNOSIS — N393 Stress incontinence (female) (male): Secondary | ICD-10-CM

## 2023-10-17 DIAGNOSIS — N3281 Overactive bladder: Secondary | ICD-10-CM

## 2023-10-17 DIAGNOSIS — K6289 Other specified diseases of anus and rectum: Secondary | ICD-10-CM | POA: Diagnosis not present

## 2023-10-17 DIAGNOSIS — N993 Prolapse of vaginal vault after hysterectomy: Secondary | ICD-10-CM | POA: Diagnosis not present

## 2023-10-17 NOTE — Assessment & Plan Note (Addendum)
-   She is interested in pursuing surgery but wants to see how her upcoming CT will look to make sure she does not need any other procedures. We discussed that surgery is booking a few months out at this time anyway.   - Plan for surgery: Exam under anesthesia, anterior repair, sacrospinous ligament fixation, cystoscopy, urethral bulking  - We reviewed the patient's specific anatomic and functional findings, with the assistance of diagrams, and together finalized the above procedure. The planned surgical procedures were discussed along with the surgical risks outlined below, which were also provided on a detailed handout. Additional treatment options including expectant management, conservative management, medical management were discussed where appropriate.  We reviewed the benefits and risks of each treatment option.   General Surgical Risks: For all procedures, there are risks of bleeding, infection, damage to surrounding organs including but not limited to bowel, bladder, blood vessels, ureters and nerves, and need for further surgery if an injury were to occur. These risks are all low with minimally invasive surgery.   There are risks of numbness and weakness at any body site or buttock/rectal pain.  It is possible that baseline pain can be worsened by surgery, either with or without mesh. If surgery is vaginal, there is also a low risk of possible conversion to laparoscopy or open abdominal incision where indicated. Very rare risks include blood transfusion, blood clot, heart attack, pneumonia, or death.   There is also a risk of short-term postoperative urinary retention with need to use a catheter. About half of patients need to go home from surgery with a catheter, which is then later removed in the office. The risk of long-term need for a catheter is very low. There is also a risk of worsening of overactive bladder.  Prolapse (with or without mesh): Risk factors for surgical failure  include  things that put pressure on your pelvis and the surgical repair, including obesity, chronic cough, and heavy lifting or straining (including lifting children or adults, straining on the toilet, or lifting heavy objects such as furniture or anything weighing >25 lbs. Risks of recurrence is 20-30% with vaginal native tissue repair and a less than 10% with sacrocolpopexy with mesh.    - For preop Visit:  She is required to have a visit within 30 days of her surgery.   - Medical clearance: not required  - Anticoagulant use: No - Medicaid Hysterectomy form: No - Accepts blood transfusion: Yes - Expected length of stay: outpatient  Request sent for surgery scheduling.

## 2023-10-17 NOTE — Progress Notes (Signed)
 [facility] Return Visit  SUBJECTIVE  History of Present Illness: Alexandra Gonzales is a 83 y.o. female seen in follow-up for urinary urgency and stage II prolapse. Last visit was started on trospium  for OAB symptoms.   She took the trospium  but it caused abdominal swelling so did not want to continue. Drinking a lot of water and avoiding soda. She is bothered by her prolapse but has a few other medical issues/ tests coming up and feels she wants to focus on those issues first.  Still having pain around the rectal area. Had improvement in her symptoms with pelvic PT, but has returned.    Urodynamic Impression:  1. Sensation was normal; capacity was reduced 2. Stress Incontinence was demonstrated at normal pressures; 3. 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 4. Emptying was dysfunctional with a normal PVR (60ml), a sustained detrusor contraction present,  abdominal straining not present, dyssynergic urethral sphincter activity on EMG.  Past Medical History: Patient  has a past medical history of Allergy , Anxiety, Bilateral carotid artery stenosis, Breast cancer (HCC), Bronchitis, Cancer (HCC), Chronic pain, Colon spasm, Diverticulitis, Dysphagia, Functional gait abnormality, GERD (gastroesophageal reflux disease), Hyperlipidemia, Hyperlipidemia, Hypertension, IBS (irritable bowel syndrome), Insomnia, Kidney stones, Low back pain, Lumbar radiculopathy, Myalgia, Neuropathy, Osteopenia, Personal history of radiation therapy, Sciatica, and Sleep disorder.   Past Surgical History: She  has a past surgical history that includes Abdominal hysterectomy; Exploratory laparotomy; Breast surgery; Eye surgery; Mouth surgery; Breast lumpectomy (Left); Radioactive seed guided excisional breast biopsy (Left, 09/26/2016); Breast excisional biopsy; Exploratory laparotomy; and Cataract extraction, bilateral.   Medications: She has a current medication list which includes the following  prescription(s): acetaminophen , amlodipine , cetirizine , dicyclomine, estradiol, furosemide, hydrocortisone , losartan , melatonin, polyvinyl alcohol-povidone, probiotic-10, and trospium  chloride.   Allergies: Patient is allergic to morphine  and codeine, codeine, dexamethasone , erythromycin, hydrocodone, lyrica [pregabalin], prevacid [lansoprazole], clarithromycin, famotidine , latex, nitrofuran derivatives, polytrim [polymyxin b-trimethoprim], prednisone, ranitidine, sulfa antibiotics, zantac [ranitidine hcl], and penicillins.   Social History: Patient  reports that she has never smoked. She has never used smokeless tobacco. She reports that she does not drink alcohol and does not use drugs.     OBJECTIVE     Physical Exam: Vitals:   10/17/23 1504  BP: 134/74  Pulse: (!) 58    Gen: No apparent distress, A&O x 3.  Prior exam:  POP-Q:    POP-Q   0                                            Aa   0                                           Ba   -5.5                                              C    3                                            Gh  4                                            Pb   6.5                                            tvl    -2                                            Ap   -2                                            Bp                                                  D        ASSESSMENT AND PLAN    Alexandra Gonzales is a 83 y.o. with:  1. Vaginal vault prolapse after hysterectomy   2. SUI (stress urinary incontinence, female)   3. Overactive bladder   4. Rectal pain      Vaginal vault prolapse after hysterectomy Assessment & Plan: - She is interested in pursuing surgery but wants to see how her upcoming CT will look to make sure she does not need any other procedures. We discussed that surgery is booking a few months out at this time anyway.   - Plan for surgery: Exam under anesthesia, anterior repair, sacrospinous ligament fixation,  cystoscopy, urethral bulking  - We reviewed the patient's specific anatomic and functional findings, with the assistance of diagrams, and together finalized the above procedure. The planned surgical procedures were discussed along with the surgical risks outlined below, which were also provided on a detailed handout. Additional treatment options including expectant management, conservative management, medical management were discussed where appropriate.  We reviewed the benefits and risks of each treatment option.   General Surgical Risks: For all procedures, there are risks of bleeding, infection, damage to surrounding organs including but not limited to bowel, bladder, blood vessels, ureters and nerves, and need for further surgery if an injury were to occur. These risks are all low with minimally invasive surgery.   There are risks of numbness and weakness at any body site or buttock/rectal pain.  It is possible that baseline pain can be worsened by surgery, either with or without mesh. If surgery is vaginal, there is also a low risk of possible conversion to laparoscopy or open abdominal incision where indicated. Very rare risks include blood transfusion, blood clot, heart attack, pneumonia, or death.   There is also a risk of short-term postoperative urinary retention with need to use a catheter. About half of patients need to go home from surgery with a catheter, which is then later removed in the office. The risk of long-term need for a catheter is very low. There is also a risk of worsening of overactive bladder.  Prolapse (with or without mesh): Risk factors for surgical failure  include things that put pressure on your pelvis and the surgical repair, including obesity, chronic cough, and heavy lifting or straining (including lifting children or adults, straining on the toilet, or lifting heavy objects such as furniture or anything weighing >25 lbs. Risks of recurrence is 20-30% with vaginal  native tissue repair and a less than 10% with sacrocolpopexy with mesh.    - For preop Visit:  She is required to have a visit within 30 days of her surgery.   - Medical clearance: not required  - Anticoagulant use: No - Medicaid Hysterectomy form: No - Accepts blood transfusion: Yes - Expected length of stay: outpatient  Request sent for surgery scheduling.     Orders: -     Ambulatory Referral For Surgery Scheduling  SUI (stress urinary incontinence, female) Assessment & Plan: - For SUI, we discussed options of urethral bulking vs sling. She prefers urethral bulking. -We discussed success rate of approximately 70-80% and possible need for second injection. We reviewed that this is not a permanent procedure and the Bulkamid does become less effective over time. Risks reviewed including injury to bladder or urethra, UTI, urinary retention and hematuria.    Orders: -     AMB referral to rehabilitation -     Ambulatory Referral For Surgery Scheduling  Overactive bladder Assessment & Plan: - She does not want to try another medication right now. Would prefer to see how symptoms are after surgery.   Orders: -     AMB referral to rehabilitation  Rectal pain -     AMB referral to rehabilitation      Rosaline LOISE Caper, MD

## 2023-10-17 NOTE — Assessment & Plan Note (Signed)
-   For SUI, we discussed options of urethral bulking vs sling. She prefers urethral bulking. -We discussed success rate of approximately 70-80% and possible need for second injection. We reviewed that this is not a permanent procedure and the Bulkamid does become less effective over time. Risks reviewed including injury to bladder or urethra, UTI, urinary retention and hematuria.

## 2023-10-17 NOTE — Assessment & Plan Note (Signed)
-   She does not want to try another medication right now. Would prefer to see how symptoms are after surgery.

## 2023-10-19 DIAGNOSIS — Z01419 Encounter for gynecological examination (general) (routine) without abnormal findings: Secondary | ICD-10-CM | POA: Diagnosis not present

## 2023-10-19 DIAGNOSIS — N8110 Cystocele, unspecified: Secondary | ICD-10-CM | POA: Diagnosis not present

## 2023-10-19 DIAGNOSIS — Z6826 Body mass index (BMI) 26.0-26.9, adult: Secondary | ICD-10-CM | POA: Diagnosis not present

## 2023-10-20 DIAGNOSIS — M19071 Primary osteoarthritis, right ankle and foot: Secondary | ICD-10-CM | POA: Diagnosis not present

## 2023-10-27 ENCOUNTER — Ambulatory Visit (HOSPITAL_COMMUNITY)
Admission: RE | Admit: 2023-10-27 | Discharge: 2023-10-27 | Disposition: A | Discharge status: Home / Self Care | Source: Ambulatory Visit | Attending: Gastroenterology | Admitting: Gastroenterology

## 2023-10-27 ENCOUNTER — Encounter (HOSPITAL_COMMUNITY): Payer: Self-pay

## 2023-10-27 DIAGNOSIS — K8020 Calculus of gallbladder without cholecystitis without obstruction: Secondary | ICD-10-CM | POA: Diagnosis not present

## 2023-10-27 DIAGNOSIS — R1033 Periumbilical pain: Secondary | ICD-10-CM | POA: Diagnosis present

## 2023-10-27 MED ORDER — IOHEXOL 300 MG/ML  SOLN
100.0000 mL | Freq: Once | INTRAMUSCULAR | Status: AC | PRN
  Administered 2023-10-27: 75 mL via INTRAVENOUS

## 2023-10-27 MED ORDER — SODIUM CHLORIDE (PF) 0.9 % IJ SOLN
INTRAMUSCULAR | Status: AC
  Filled 2023-10-27: qty 50

## 2023-10-28 DIAGNOSIS — I10 Essential (primary) hypertension: Secondary | ICD-10-CM | POA: Diagnosis not present

## 2023-10-30 ENCOUNTER — Other Ambulatory Visit: Payer: Self-pay | Admitting: Gastroenterology

## 2023-10-30 DIAGNOSIS — R1011 Right upper quadrant pain: Secondary | ICD-10-CM

## 2023-11-01 DIAGNOSIS — K219 Gastro-esophageal reflux disease without esophagitis: Secondary | ICD-10-CM | POA: Diagnosis not present

## 2023-11-01 DIAGNOSIS — R1013 Epigastric pain: Secondary | ICD-10-CM | POA: Diagnosis not present

## 2023-11-01 DIAGNOSIS — R933 Abnormal findings on diagnostic imaging of other parts of digestive tract: Secondary | ICD-10-CM | POA: Diagnosis not present

## 2023-11-03 ENCOUNTER — Ambulatory Visit
Admission: RE | Admit: 2023-11-03 | Discharge: 2023-11-03 | Disposition: A | Discharge status: Home / Self Care | Source: Ambulatory Visit | Attending: Gastroenterology | Admitting: Gastroenterology

## 2023-11-03 DIAGNOSIS — K8020 Calculus of gallbladder without cholecystitis without obstruction: Secondary | ICD-10-CM | POA: Diagnosis not present

## 2023-11-03 DIAGNOSIS — R1011 Right upper quadrant pain: Secondary | ICD-10-CM

## 2023-11-10 DIAGNOSIS — E7800 Pure hypercholesterolemia, unspecified: Secondary | ICD-10-CM | POA: Diagnosis not present

## 2023-11-10 DIAGNOSIS — I10 Essential (primary) hypertension: Secondary | ICD-10-CM | POA: Diagnosis not present

## 2023-11-29 ENCOUNTER — Telehealth: Payer: Self-pay

## 2023-11-29 NOTE — Telephone Encounter (Signed)
 Patient called wanting to know the status of her surgery. States she was advised that she would be expecting a call and just wanted to follow up. Patient can be reached at [phone].  Thank you

## 2023-12-10 DIAGNOSIS — E7800 Pure hypercholesterolemia, unspecified: Secondary | ICD-10-CM | POA: Diagnosis not present

## 2023-12-10 DIAGNOSIS — I10 Essential (primary) hypertension: Secondary | ICD-10-CM | POA: Diagnosis not present

## 2023-12-13 ENCOUNTER — Encounter: Payer: Self-pay | Admitting: Obstetrics and Gynecology

## 2023-12-13 DIAGNOSIS — M545 Low back pain, unspecified: Secondary | ICD-10-CM | POA: Diagnosis not present

## 2023-12-13 DIAGNOSIS — E7800 Pure hypercholesterolemia, unspecified: Secondary | ICD-10-CM | POA: Diagnosis not present

## 2023-12-13 DIAGNOSIS — R7303 Prediabetes: Secondary | ICD-10-CM | POA: Diagnosis not present

## 2023-12-13 DIAGNOSIS — I10 Essential (primary) hypertension: Secondary | ICD-10-CM | POA: Diagnosis not present

## 2023-12-13 DIAGNOSIS — N3281 Overactive bladder: Secondary | ICD-10-CM | POA: Diagnosis not present

## 2023-12-18 DIAGNOSIS — M545 Low back pain, unspecified: Secondary | ICD-10-CM | POA: Diagnosis not present

## 2023-12-18 DIAGNOSIS — M25562 Pain in left knee: Secondary | ICD-10-CM | POA: Diagnosis not present

## 2024-02-01 ENCOUNTER — Encounter: Admitting: Obstetrics and Gynecology

## 2024-02-02 ENCOUNTER — Ambulatory Visit: Admitting: Obstetrics and Gynecology

## 2024-02-02 ENCOUNTER — Encounter: Payer: Self-pay | Admitting: Obstetrics and Gynecology

## 2024-02-02 VITALS — BP 147/69 | HR 71 | Wt 148.0 lb

## 2024-02-02 DIAGNOSIS — Z01818 Encounter for other preprocedural examination: Secondary | ICD-10-CM

## 2024-02-02 DIAGNOSIS — N993 Prolapse of vaginal vault after hysterectomy: Secondary | ICD-10-CM

## 2024-02-02 MED ORDER — IBUPROFEN 600 MG PO TABS: 600.0000 mg | ORAL_TABLET | Freq: Four times a day (QID) | ORAL | 0 refills | Status: AC | PRN

## 2024-02-02 MED ORDER — TRAMADOL HCL 50 MG PO TABS: 50.0000 mg | ORAL_TABLET | Freq: Three times a day (TID) | ORAL | 0 refills | Status: AC | PRN

## 2024-02-02 MED ORDER — POLYETHYLENE GLYCOL 3350 17 GM/SCOOP PO POWD: 17.0000 g | Freq: Every day | ORAL | 0 refills | Status: AC

## 2024-02-02 NOTE — Progress Notes (Signed)
 Alexandra Gonzales  Subjective Chief Complaint: Alexandra Gonzales presents for a preoperative encounter.   History of Present Illness: Alexandra Gonzales is a 84 y.o. female who presents for preoperative visit.  She is scheduled to undergo Exam under anesthesia, anterior repair, sacrospinous ligament fixation, cystoscopy, urethral bulking on 02/12/24.  Her symptoms include vaginal bulge, and she was was found to have Stage II anterior, Stage I posterior, Stage I apical prolapse.   Urodynamic Impression:  1. Sensation was normal; capacity was reduced 2. Stress Incontinence was demonstrated at normal pressures; 3. 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 4. Emptying was dysfunctional with a normal PVR (60ml), a sustained detrusor contraction present,  abdominal straining not present, dyssynergic urethral sphincter activity on EMG.    Past Medical History:  Diagnosis Date   Allergy     Anxiety    Bilateral carotid artery stenosis    Breast cancer (HCC)    Bronchitis    Cancer (HCC)    questionable cancer to nipple   Chronic pain    Colon spasm    Diverticulitis    Dysphagia    Functional gait abnormality    GERD (gastroesophageal reflux disease)    Hyperlipidemia    Hyperlipidemia    Hypertension    IBS (irritable bowel syndrome)    Insomnia    Kidney stones    Low back pain    Lumbar radiculopathy    Myalgia    Neuropathy    Osteopenia    Personal history of radiation therapy    Sciatica    Sleep disorder      Past Surgical History:  Procedure Laterality Date   ABDOMINAL HYSTERECTOMY     BREAST EXCISIONAL BIOPSY     BREAST LUMPECTOMY Left    march 2000   BREAST SURGERY     R lumpectomy 2000   CATARACT EXTRACTION, BILATERAL     EXPLORATORY LAPAROTOMY     EXPLORATORY LAPAROTOMY     EYE SURGERY     bilat cataract removal   MOUTH SURGERY     RADIOACTIVE SEED GUIDED EXCISIONAL BREAST BIOPSY Left 09/26/2016   Procedure: LEFT RADIOACTIVE  SEED GUIDED EXCISIONAL BREAST BIOPSY ERAS PATHWAY;  Surgeon: Ebbie Cough, MD;  Location: [facility];  Service: General;  Laterality: Left;  LMA    is allergic to morphine  and codeine, codeine, dexamethasone , erythromycin, hydrocodone, lyrica [pregabalin], prevacid [lansoprazole], clarithromycin, famotidine , latex, nitrofuran derivatives, polytrim [polymyxin b-trimethoprim], prednisone, ranitidine, sulfa antibiotics, zantac [ranitidine hcl], and penicillins.   Family History  Problem Relation Age of Onset   Congestive Heart Failure Mother        Died 61   Kidney Stones Father        Died 63 - complications from kidney stone   Breast cancer Sister 57   Leukemia Maternal Uncle    Colon cancer Maternal Uncle    Bladder Cancer Neg Hx    Renal cancer Neg Hx    Uterine cancer Neg Hx     Social History[1]   Review of Systems was negative for a full 10 system review except as noted in the History of Present Illness.  Current Medications[2]   Objective Vitals:   02/02/24 1444  BP: (!) 147/69  Pulse: 71    Gen: NAD CV: S1 S2 RRR Lungs: Clear to auscultation bilaterally Abd: soft, nontender   Previous Pelvic Exam showed: POP-Q:    POP-Q   0  Aa   0                                           Ba   -5.5                                              C    3                                            Gh   4                                            Pb   6.5                                            tvl    -2                                            Ap   -2                                            Bp                                                  D       Assessment/ Plan  Assessment: The patient is a 84 y.o. year old scheduled to undergo Exam under anesthesia, anterior repair, sacrospinous ligament fixation, cystoscopy, urethral bulking.   Plan: General Surgical Consent: The patient has  previously been counseled on alternative treatments, and the decision by the patient and provider was to proceed with the procedure listed above.  For all procedures, there are risks of bleeding, infection, damage to surrounding organs including but not limited to bowel, bladder, blood vessels, ureters and nerves, and need for further surgery if an injury were to occur. These risks are all low with minimally invasive surgery.   There are risks of numbness and weakness at any body site or buttock/rectal pain.  It is possible that baseline pain can be worsened by surgery, either with or without mesh. If surgery is vaginal, there is also a low risk of possible conversion to laparoscopy or open abdominal incision where indicated. Very rare risks include blood transfusion, blood clot, heart attack, pneumonia, or death.   There is also a risk of short-term postoperative urinary retention with need to use a catheter. About half of patients need to go home from surgery with a catheter, which is then later removed in the office. The  risk of long-term need for a catheter is very low. There is also a risk of worsening of overactive bladder.     Prolapse (with or without mesh): Risk factors for surgical failure  include things that put pressure on your pelvis and the surgical repair, including obesity, chronic cough, and heavy lifting or straining (including lifting children or adults, straining on the toilet, or lifting heavy objects such as furniture or anything weighing >25 lbs. Risks of recurrence is 20-30% with vaginal native tissue repair and a less than 10% with sacrocolpopexy with mesh.    We discussed consent for blood products. Risks for blood transfusion include allergic reactions, other reactions that can affect different body organs and managed accordingly, transmission of infectious diseases such as HIV or Hepatitis. However, the blood is screened. Patient consents for blood products.  Pre-operative  instructions:  She was instructed to not take Aspirin/NSAIDs x 7days prior to surgery.  Antibiotic prophylaxis was ordered as indicated.  Catheter use: Patient will go home with foley if needed after post-operative voiding trial.  Post-operative instructions:  She was provided with specific post-operative instructions, including precautions and signs/symptoms for which we would recommend contacting us , in addition to daytime and after-hours contact phone numbers. This was provided on a handout.   Post-operative medications: Prescriptions for motrin , miralax, and tramadol  were sent to her pharmacy. Discussed using ibuprofen  and tylenol  on a schedule to limit use of narcotics.   Laboratory testing:  none needed  Preoperative clearance:  She does not require surgical clearance.    Post-operative follow-up:  A post-operative appointment will be made for 6 weeks from the date of surgery. If she needs a post-operative nurse visit for a voiding trial, that will be set up after she leaves the hospital.    Patient will call the clinic or use MyChart should anything change or any new issues arise.   Alexandra LOISE Caper, MD       [1]  Social History Tobacco Use   Smoking status: Never   Smokeless tobacco: Never  Vaping Use   Vaping status: Never Used  Substance Use Topics   Alcohol use: No   Drug use: No  [2]  Current Outpatient Medications:    acetaminophen  (TYLENOL ) 500 MG tablet, Take 500 mg by mouth every 6 (six) hours as needed., Disp: , Rfl:    amLODipine  (NORVASC ) 5 MG tablet, Take 5 mg by mouth at bedtime., Disp: , Rfl:    cetirizine  (ZYRTEC ) 5 MG tablet, Cetirizine  HCl, Disp: , Rfl:    dicyclomine (BENTYL) 10 MG capsule, Take 10 mg by mouth 4 (four) times daily -  before meals and at bedtime. (Patient taking differently: Take 10 mg by mouth 4 (four) times daily -  before meals and at bedtime. As needed), Disp: , Rfl:    estradiol (ESTRACE) 0.1 MG/GM vaginal cream, Place  vaginally., Disp: , Rfl:    furosemide (LASIX) 20 MG tablet, Take 20 mg by mouth daily. As needed, Disp: , Rfl:    hydrocortisone  2.5 % cream, Apply topically 2 (two) times daily as needed (eczema)., Disp: 30 g, Rfl: 2   losartan  (COZAAR ) 100 MG tablet, Take 100 mg by mouth daily., Disp: , Rfl:    Melatonin 10 MG TABS, Take by mouth., Disp: , Rfl:    Polyvinyl Alcohol-Povidone (REFRESH OP), Place 1 drop into both eyes daily as needed (dry eyes)., Disp: , Rfl:    Probiotic Product (PROBIOTIC-10) CAPS, Take 1 capsule by mouth daily., Disp: ,  Rfl:    Trospium  Chloride 60 MG CP24, Take 1 capsule (60 mg total) by mouth daily., Disp: 30 capsule, Rfl: 5

## 2024-02-02 NOTE — H&P (Signed)
 [facility] Pre-Operative H&P  Subjective Chief Complaint: Alexandra Gonzales presents for a preoperative encounter.   History of Present Illness: Alexandra Gonzales is a 84 y.o. female who presents for preoperative visit.  She is scheduled to undergo Exam under anesthesia, anterior repair, sacrospinous ligament fixation, cystoscopy, urethral bulking on 02/12/24.  Her symptoms include vaginal bulge, and she was was found to have Stage II anterior, Stage I posterior, Stage I apical prolapse.   Urodynamic Impression:  1. Sensation was normal; capacity was reduced 2. Stress Incontinence was demonstrated at normal pressures; 3. 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 4. Emptying was dysfunctional with a normal PVR (60ml), a sustained detrusor contraction present,  abdominal straining not present, dyssynergic urethral sphincter activity on EMG.    Past Medical History:  Diagnosis Date   Allergy     Anxiety    Bilateral carotid artery stenosis    Breast cancer (HCC)    Bronchitis    Cancer (HCC)    questionable cancer to nipple   Chronic pain    Colon spasm    Diverticulitis    Dysphagia    Functional gait abnormality    GERD (gastroesophageal reflux disease)    Hyperlipidemia    Hyperlipidemia    Hypertension    IBS (irritable bowel syndrome)    Insomnia    Kidney stones    Low back pain    Lumbar radiculopathy    Myalgia    Neuropathy    Osteopenia    Personal history of radiation therapy    Sciatica    Sleep disorder      Past Surgical History:  Procedure Laterality Date   ABDOMINAL HYSTERECTOMY     BREAST EXCISIONAL BIOPSY     BREAST LUMPECTOMY Left    march 2000   BREAST SURGERY     R lumpectomy 2000   CATARACT EXTRACTION, BILATERAL     EXPLORATORY LAPAROTOMY     EXPLORATORY LAPAROTOMY     EYE SURGERY     bilat cataract removal   MOUTH SURGERY     RADIOACTIVE SEED GUIDED EXCISIONAL BREAST BIOPSY Left 09/26/2016   Procedure: LEFT RADIOACTIVE  SEED GUIDED EXCISIONAL BREAST BIOPSY ERAS PATHWAY;  Surgeon: Ebbie Cough, MD;  Location: [facility];  Service: General;  Laterality: Left;  LMA    is allergic to morphine  and codeine, codeine, dexamethasone , erythromycin, hydrocodone, lyrica [pregabalin], prevacid [lansoprazole], clarithromycin, famotidine , latex, nitrofuran derivatives, polytrim [polymyxin b-trimethoprim], prednisone, ranitidine, sulfa antibiotics, zantac [ranitidine hcl], and penicillins.   Family History  Problem Relation Age of Onset   Congestive Heart Failure Mother        Died 64   Kidney Stones Father        Died 28 - complications from kidney stone   Breast cancer Sister 38   Leukemia Maternal Uncle    Colon cancer Maternal Uncle    Bladder Cancer Neg Hx    Renal cancer Neg Hx    Uterine cancer Neg Hx     Social History[1]   Review of Systems was negative for a full 10 system review except as noted in the History of Present Illness.  Current Medications[2]   Objective There were no vitals filed for this visit.   Gen: NAD CV: S1 S2 RRR Lungs: Clear to auscultation bilaterally Abd: soft, nontender   Previous Pelvic Exam showed: POP-Q:    POP-Q   0  Aa   0                                           Ba   -5.5                                              C    3                                            Gh   4                                            Pb   6.5                                            tvl    -2                                            Ap   -2                                            Bp                                                  D       Assessment/ Plan  Assessment: The patient is a 84 y.o. year old scheduled to undergo Exam under anesthesia, anterior repair, sacrospinous ligament fixation, cystoscopy, urethral bulking.     Rosaline LOISE Caper, MD        [1]  Social  History Tobacco Use   Smoking status: Never   Smokeless tobacco: Never  Vaping Use   Vaping status: Never Used  Substance Use Topics   Alcohol use: No   Drug use: No  [2] No current facility-administered medications for this encounter.  Current Outpatient Medications:    acetaminophen  (TYLENOL ) 500 MG tablet, Take 500 mg by mouth every 6 (six) hours as needed., Disp: , Rfl:    amLODipine  (NORVASC ) 5 MG tablet, Take 5 mg by mouth at bedtime., Disp: , Rfl:    cetirizine  (ZYRTEC ) 5 MG tablet, Cetirizine  HCl, Disp: , Rfl:    dicyclomine (BENTYL) 10 MG capsule, Take 10 mg by mouth 4 (four) times daily -  before meals and at bedtime. (Patient taking differently: Take 10 mg by mouth 4 (four) times daily -  before meals and at bedtime. As needed), Disp: , Rfl:    estradiol (ESTRACE) 0.1 MG/GM vaginal cream, Place vaginally., Disp: , Rfl:  furosemide (LASIX) 20 MG tablet, Take 20 mg by mouth daily. As needed, Disp: , Rfl:    hydrocortisone  2.5 % cream, Apply topically 2 (two) times daily as needed (eczema)., Disp: 30 g, Rfl: 2   ibuprofen  (ADVIL ) 600 MG tablet, Take 1 tablet (600 mg total) by mouth every 6 (six) hours as needed., Disp: 30 tablet, Rfl: 0   losartan  (COZAAR ) 100 MG tablet, Take 100 mg by mouth daily., Disp: , Rfl:    Melatonin 10 MG TABS, Take by mouth., Disp: , Rfl:    polyethylene glycol powder (GLYCOLAX /MIRALAX ) 17 GM/SCOOP powder, Take 17 g by mouth daily. Drink 17g (1 scoop) dissolved in water per day., Disp: 255 g, Rfl: 0   Polyvinyl Alcohol-Povidone (REFRESH OP), Place 1 drop into both eyes daily as needed (dry eyes)., Disp: , Rfl:    Probiotic Product (PROBIOTIC-10) CAPS, Take 1 capsule by mouth daily., Disp: , Rfl:    traMADol  (ULTRAM ) 50 MG tablet, Take 1 tablet (50 mg total) by mouth every 8 (eight) hours as needed for up to 5 days., Disp: 10 tablet, Rfl: 0   Trospium  Chloride 60 MG CP24, Take 1 capsule (60 mg total) by mouth daily., Disp: 30 capsule, Rfl: 5

## 2024-02-05 ENCOUNTER — Encounter: Admitting: Obstetrics and Gynecology

## 2024-02-12 ENCOUNTER — Encounter (HOSPITAL_COMMUNITY): Admission: RE | Payer: Self-pay | Source: Home / Self Care

## 2024-02-12 ENCOUNTER — Ambulatory Visit (HOSPITAL_COMMUNITY): Admission: RE | Admit: 2024-02-12 | Source: Home / Self Care | Admitting: Obstetrics and Gynecology

## 2024-03-22 ENCOUNTER — Encounter: Admitting: Obstetrics and Gynecology
